# Patient Record
Sex: Male | Born: 1945 | Race: White | Hispanic: No | Marital: Married | Smoking: Former smoker
Health system: Southern US, Community
[De-identification: ages and names within clinical notes are randomized; demographics above are authoritative.]

## PROBLEM LIST (undated history)

## (undated) DIAGNOSIS — F419 Anxiety disorder, unspecified: Secondary | ICD-10-CM

## (undated) DIAGNOSIS — F1020 Alcohol dependence, uncomplicated: Secondary | ICD-10-CM

## (undated) DIAGNOSIS — E785 Hyperlipidemia, unspecified: Secondary | ICD-10-CM

## (undated) DIAGNOSIS — F319 Bipolar disorder, unspecified: Secondary | ICD-10-CM

## (undated) DIAGNOSIS — C61 Malignant neoplasm of prostate: Secondary | ICD-10-CM

## (undated) DIAGNOSIS — Z86718 Personal history of other venous thrombosis and embolism: Secondary | ICD-10-CM

## (undated) DIAGNOSIS — M1990 Unspecified osteoarthritis, unspecified site: Secondary | ICD-10-CM

## (undated) DIAGNOSIS — R0600 Dyspnea, unspecified: Secondary | ICD-10-CM

## (undated) DIAGNOSIS — I82409 Acute embolism and thrombosis of unspecified deep veins of unspecified lower extremity: Secondary | ICD-10-CM

## (undated) DIAGNOSIS — K759 Inflammatory liver disease, unspecified: Secondary | ICD-10-CM

## (undated) DIAGNOSIS — N529 Male erectile dysfunction, unspecified: Secondary | ICD-10-CM

## (undated) DIAGNOSIS — I10 Essential (primary) hypertension: Secondary | ICD-10-CM

## (undated) DIAGNOSIS — J449 Chronic obstructive pulmonary disease, unspecified: Secondary | ICD-10-CM

## (undated) DIAGNOSIS — I951 Orthostatic hypotension: Secondary | ICD-10-CM

## (undated) DIAGNOSIS — I2699 Other pulmonary embolism without acute cor pulmonale: Secondary | ICD-10-CM

## (undated) DIAGNOSIS — I639 Cerebral infarction, unspecified: Secondary | ICD-10-CM

## (undated) DIAGNOSIS — D6851 Activated protein C resistance: Secondary | ICD-10-CM

## (undated) DIAGNOSIS — D689 Coagulation defect, unspecified: Secondary | ICD-10-CM

## (undated) HISTORY — DX: Unspecified osteoarthritis, unspecified site: M19.90

## (undated) HISTORY — DX: Chronic obstructive pulmonary disease, unspecified: J44.9

## (undated) HISTORY — PX: COLONOSCOPY WITH PROPOFOL: SHX5780

## (undated) HISTORY — DX: Alcohol dependence, uncomplicated: F10.20

## (undated) HISTORY — PX: OTHER SURGICAL HISTORY: SHX169

## (undated) HISTORY — DX: Hyperlipidemia, unspecified: E78.5

## (undated) HISTORY — DX: Personal history of other venous thrombosis and embolism: Z86.718

## (undated) HISTORY — PX: TOTAL HIP ARTHROPLASTY: SHX124

## (undated) HISTORY — DX: Anxiety disorder, unspecified: F41.9

## (undated) HISTORY — DX: Essential (primary) hypertension: I10

## (undated) HISTORY — PX: CATARACT EXTRACTION: SUR2

## (undated) HISTORY — DX: Coagulation defect, unspecified: D68.9

## (undated) HISTORY — DX: Orthostatic hypotension: I95.1

## (undated) HISTORY — PX: TONSILLECTOMY AND ADENOIDECTOMY: SUR1326

## (undated) HISTORY — DX: Malignant neoplasm of prostate: C61

---

## 2001-11-25 ENCOUNTER — Encounter: Payer: Self-pay | Admitting: Orthopedic Surgery

## 2001-11-28 ENCOUNTER — Inpatient Hospital Stay (HOSPITAL_COMMUNITY): Admission: RE | Admit: 2001-11-28 | Discharge: 2001-12-04 | Payer: Self-pay | Admitting: Orthopedic Surgery

## 2001-11-28 ENCOUNTER — Encounter: Payer: Self-pay | Admitting: Orthopedic Surgery

## 2003-10-04 ENCOUNTER — Ambulatory Visit (HOSPITAL_COMMUNITY): Admission: RE | Admit: 2003-10-04 | Discharge: 2003-10-04 | Payer: Self-pay | Admitting: Gastroenterology

## 2003-12-14 ENCOUNTER — Other Ambulatory Visit (HOSPITAL_COMMUNITY): Admission: RE | Admit: 2003-12-14 | Discharge: 2003-12-24 | Payer: Self-pay | Admitting: Psychiatry

## 2004-11-30 DIAGNOSIS — I82409 Acute embolism and thrombosis of unspecified deep veins of unspecified lower extremity: Secondary | ICD-10-CM

## 2004-11-30 HISTORY — DX: Acute embolism and thrombosis of unspecified deep veins of unspecified lower extremity: I82.409

## 2005-03-12 ENCOUNTER — Encounter: Admission: RE | Admit: 2005-03-12 | Discharge: 2005-03-12 | Payer: Self-pay | Admitting: Chiropractic Medicine

## 2006-09-29 ENCOUNTER — Ambulatory Visit: Payer: Self-pay | Admitting: Cardiology

## 2006-10-13 ENCOUNTER — Ambulatory Visit: Payer: Self-pay

## 2007-01-25 ENCOUNTER — Ambulatory Visit: Payer: Self-pay | Admitting: Vascular Surgery

## 2007-06-20 ENCOUNTER — Emergency Department (HOSPITAL_COMMUNITY): Admission: EM | Admit: 2007-06-20 | Discharge: 2007-06-20 | Payer: Self-pay | Admitting: Emergency Medicine

## 2007-06-21 ENCOUNTER — Ambulatory Visit (HOSPITAL_COMMUNITY): Admission: RE | Admit: 2007-06-21 | Discharge: 2007-06-21 | Payer: Self-pay | Admitting: Family Medicine

## 2007-06-21 ENCOUNTER — Ambulatory Visit: Payer: Self-pay | Admitting: Vascular Surgery

## 2007-08-05 ENCOUNTER — Ambulatory Visit: Payer: Self-pay | Admitting: Psychology

## 2007-08-22 ENCOUNTER — Ambulatory Visit: Payer: Self-pay | Admitting: Psychology

## 2007-09-06 ENCOUNTER — Ambulatory Visit: Payer: Self-pay | Admitting: Vascular Surgery

## 2007-09-16 ENCOUNTER — Ambulatory Visit: Payer: Self-pay | Admitting: Psychology

## 2007-10-05 ENCOUNTER — Ambulatory Visit: Payer: Self-pay | Admitting: Psychology

## 2007-12-17 ENCOUNTER — Emergency Department (HOSPITAL_COMMUNITY): Admission: EM | Admit: 2007-12-17 | Discharge: 2007-12-17 | Payer: Self-pay | Admitting: Emergency Medicine

## 2007-12-22 ENCOUNTER — Observation Stay (HOSPITAL_COMMUNITY): Admission: AD | Admit: 2007-12-22 | Discharge: 2007-12-23 | Payer: Self-pay | Admitting: Internal Medicine

## 2007-12-22 ENCOUNTER — Ambulatory Visit: Payer: Self-pay | Admitting: Cardiology

## 2007-12-23 ENCOUNTER — Ambulatory Visit: Payer: Self-pay | Admitting: Vascular Surgery

## 2007-12-23 ENCOUNTER — Encounter (INDEPENDENT_AMBULATORY_CARE_PROVIDER_SITE_OTHER): Payer: Self-pay | Admitting: Internal Medicine

## 2008-10-17 ENCOUNTER — Encounter: Admission: RE | Admit: 2008-10-17 | Discharge: 2008-10-17 | Payer: Self-pay | Admitting: Interventional Radiology

## 2008-12-12 ENCOUNTER — Encounter: Admission: RE | Admit: 2008-12-12 | Discharge: 2008-12-12 | Payer: Self-pay | Admitting: Interventional Radiology

## 2008-12-18 ENCOUNTER — Encounter: Admission: RE | Admit: 2008-12-18 | Discharge: 2008-12-18 | Payer: Self-pay | Admitting: Interventional Radiology

## 2008-12-20 ENCOUNTER — Encounter: Admission: RE | Admit: 2008-12-20 | Discharge: 2008-12-20 | Payer: Self-pay | Admitting: Interventional Radiology

## 2009-01-15 ENCOUNTER — Encounter: Admission: RE | Admit: 2009-01-15 | Discharge: 2009-01-15 | Payer: Self-pay | Admitting: Interventional Radiology

## 2009-03-21 ENCOUNTER — Emergency Department (HOSPITAL_COMMUNITY): Admission: EM | Admit: 2009-03-21 | Discharge: 2009-03-22 | Payer: Self-pay | Admitting: Emergency Medicine

## 2009-03-22 ENCOUNTER — Inpatient Hospital Stay (HOSPITAL_COMMUNITY): Admission: RE | Admit: 2009-03-22 | Discharge: 2009-03-27 | Payer: Self-pay | Admitting: Psychiatry

## 2009-03-22 ENCOUNTER — Ambulatory Visit: Payer: Self-pay | Admitting: Psychiatry

## 2009-03-28 ENCOUNTER — Ambulatory Visit: Payer: Self-pay | Admitting: Psychiatry

## 2009-04-01 ENCOUNTER — Other Ambulatory Visit (HOSPITAL_COMMUNITY): Admission: RE | Admit: 2009-04-01 | Discharge: 2009-06-20 | Payer: Self-pay | Admitting: Psychiatry

## 2009-06-25 ENCOUNTER — Encounter: Admission: RE | Admit: 2009-06-25 | Discharge: 2009-06-25 | Payer: Self-pay | Admitting: Interventional Radiology

## 2010-06-18 ENCOUNTER — Encounter: Payer: Self-pay | Admitting: Internal Medicine

## 2010-06-18 ENCOUNTER — Ambulatory Visit (HOSPITAL_COMMUNITY): Admission: RE | Admit: 2010-06-18 | Discharge: 2010-06-18 | Payer: Self-pay | Admitting: Urology

## 2010-06-19 ENCOUNTER — Encounter: Payer: Self-pay | Admitting: Internal Medicine

## 2010-06-19 ENCOUNTER — Observation Stay (HOSPITAL_COMMUNITY): Admission: EM | Admit: 2010-06-19 | Discharge: 2010-06-20 | Payer: Self-pay | Admitting: Emergency Medicine

## 2010-06-25 ENCOUNTER — Ambulatory Visit: Admission: RE | Admit: 2010-06-25 | Discharge: 2010-08-29 | Payer: Self-pay | Admitting: Radiation Oncology

## 2010-07-02 ENCOUNTER — Inpatient Hospital Stay (HOSPITAL_COMMUNITY): Admission: AD | Admit: 2010-07-02 | Discharge: 2010-07-03 | Payer: Self-pay | Admitting: Internal Medicine

## 2010-08-05 ENCOUNTER — Encounter: Payer: Self-pay | Admitting: Internal Medicine

## 2010-09-02 ENCOUNTER — Ambulatory Visit: Payer: Self-pay | Admitting: Internal Medicine

## 2010-09-02 DIAGNOSIS — E785 Hyperlipidemia, unspecified: Secondary | ICD-10-CM | POA: Insufficient documentation

## 2010-09-02 DIAGNOSIS — C61 Malignant neoplasm of prostate: Secondary | ICD-10-CM | POA: Insufficient documentation

## 2010-09-02 DIAGNOSIS — E119 Type 2 diabetes mellitus without complications: Secondary | ICD-10-CM | POA: Insufficient documentation

## 2010-09-02 DIAGNOSIS — Z86718 Personal history of other venous thrombosis and embolism: Secondary | ICD-10-CM | POA: Insufficient documentation

## 2010-09-02 DIAGNOSIS — Z87891 Personal history of nicotine dependence: Secondary | ICD-10-CM | POA: Insufficient documentation

## 2010-09-03 ENCOUNTER — Ambulatory Visit: Payer: Self-pay | Admitting: Cardiovascular Disease

## 2010-09-03 LAB — CONVERTED CEMR LAB
Chloride: 102 meq/L (ref 96–112)
Creatinine, Ser: 1 mg/dL (ref 0.4–1.5)
GFR calc non Af Amer: 84.86 mL/min (ref >60)
Glucose, Bld: 101 mg/dL — ABNORMAL HIGH (ref 70–99)
Sodium: 136 meq/L (ref 135–145)

## 2010-09-09 ENCOUNTER — Telehealth (INDEPENDENT_AMBULATORY_CARE_PROVIDER_SITE_OTHER): Payer: Self-pay | Admitting: *Deleted

## 2010-09-10 ENCOUNTER — Telehealth: Payer: Self-pay | Admitting: Internal Medicine

## 2010-10-07 ENCOUNTER — Ambulatory Visit: Payer: Self-pay | Admitting: Internal Medicine

## 2010-10-21 ENCOUNTER — Ambulatory Visit
Admission: RE | Admit: 2010-10-21 | Discharge: 2010-12-30 | Payer: Self-pay | Source: Home / Self Care | Attending: Radiation Oncology | Admitting: Radiation Oncology

## 2010-11-30 DIAGNOSIS — C61 Malignant neoplasm of prostate: Secondary | ICD-10-CM

## 2010-11-30 HISTORY — DX: Malignant neoplasm of prostate: C61

## 2010-12-31 ENCOUNTER — Ambulatory Visit: Payer: 59 | Attending: Radiation Oncology | Admitting: Radiation Oncology

## 2010-12-31 DIAGNOSIS — C61 Malignant neoplasm of prostate: Secondary | ICD-10-CM | POA: Insufficient documentation

## 2010-12-31 DIAGNOSIS — Z510 Encounter for antineoplastic radiation therapy: Secondary | ICD-10-CM | POA: Insufficient documentation

## 2011-01-01 NOTE — Letter (Signed)
[facility]: [facility]   Imported By: Sherian Rein 09/05/2010 12:13:39  _____________________________________________________________________  External Attachment:    Type:   Image     Comment:   External Document

## 2011-01-01 NOTE — Assessment & Plan Note (Signed)
Summary: 1 month return/mhh   Primary Provider/Referring Provider:  [name]  CC:  1 month follow up visit-Denies any SOB/wheezing; cough-productive at times(clear in color)..  History of Present Illness: History of Present Illness: September 02, 2010- 65 yoM who comes with his wife on kind referral by [name] after pulmonary embolism. He was dx'd with prostate cancer in July. CT for staging discovered right iliac DVT and contrast chest CT showed pulmonary emboli. He is now on coumadin, managed by Dr [name]. He reports INR today was 4.0. There has been no bleeding. He had had an episode of calf swelling and an episode of self-limited chest pain within the last 2 months. [name] had given injections to shrink the prostate, Soon after he had to be hosp overnight for acute bowel obstruction. He has not had surgery for these problems. The chest CT, done in July, also showed incidental right 8th rib fracture, coronary artery calcification, and no chest mets. Doppler leg vein exam was done 2 weeks ago at [facility] when leg was swollen, and was positive for right iliac clot. He has known pelvic mets. At issue is whether coumadin can be stopped long enough for placement of radioactive seeds. He admits some early morning cough, no wheeze, and no hx of COPD, Asthma or lung disease. Notes some dyspnea with exertion walking, but not much. His wife repeatedly emphasized that he was not an active man. He gives hx DM II, TIA, bilateral hip replacement, most recently in 2006. 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  October 07, 2010- Hx PE/ DVT, Prostate cancer At issue is whether coumadin can be stopped long enough for placement of radioactive seeds. Nurse-CC: 1 month follow up visit-Denies any SOB/wheezing; cough-productive at times(clear in color). CT scan on 09/03/10 was clear of  pulmonary clot. Dr [name] has managed his coumadin, currently on 7.5 mg daily.Dppler at  [facility] had shown right iliac clot in September as described on last visit.  Has not had abnormal bleeding, chest pain or sudden events. Had flu shot. He is still on medcation to shrink his prostate.      Preventive Screening-Counseling & Management  Alcohol-Tobacco     Smoking Status: current     Packs/Day: 1.0     Year Started: age 65     Year Quit: 2011     Tobacco Counseling: not to resume use of tobacco products  Current Medications (verified): 1)  Pristiq 50 Mg Xr24h-Tab (Desvenlafaxine Succinate) .... Take 1 By Mouth Once Daily 2)  Seroquel 50 Mg Tabs (Quetiapine Fumarate) .... Take 1 By Mouth Once Daily 3)  Coumadin 10 Mg Tabs (Warfarin Sodium) .... Take As Directed 4)  Metformin Hcl 500 Mg Tabs (Metformin Hcl) .... Take 1 By Mouth Once Daily 5)  Proscar 5 Mg Tabs (Finasteride) .... Take 1 By Mouth Once Daily  Allergies (verified): 1)  ! Sulfa  Past History:  Past Medical History: Last updated: 09/02/2010 Diabetes, Type 2 Hyperlipidemia Venous thromboembolism 2011- DVT right iliac v,. 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         Rarely any sort of leg cramp except rarely lying in bed. No leg swelling.   Vital Signs:  Patient profile:   65 year old male Height:      72 inches Weight:      220.25 pounds BMI:     29.98 O2 Sat:      95 % on Room air Pulse rate:   94 / minute BP sitting:   104 / 62  (left arm) Cuff size:   regular  Vitals Entered By: Reynaldo Minium CMA (October 07, 2010 1:53 PM)  O2 Flow:  Room air CC: 1 month follow up visit-Denies any SOB/wheezing; cough-productive at times(clear in color).   Physical Exam  Additional Exam:  General: A/Ox3; pleasant and cooperative, NAD, overweight SKIN: no rash, lesions NODES: no lymphadenopathy HEENT: [place]/AT, EOM- WNL, Conjuctivae- clear, PERRLA, TM-WNL, Nose- clear, Throat- pharynx reddened, Mallampati  II-III, not hoarse NECK: Supple w/ fair ROM, JVD- none, normal carotid impulses w/o bruits Thyroid- normal to palpation, no stridor CHEST: Clear to P&A, no rales, rub, cough or wheeze HEART: RRR, no m/g/r heard ABDOMEN- abdominal obesity ZOX:WRUE, nl pulses, no edema, cyanosis or clubbing. Negative Homan's confirmed again today. NEURO: Grossly intact to observation      CT of Chest  Procedure date:  09/03/2010  Findings:      CT ANGIO CHEST W/CM &/OR WO/CM - [id number]   Clinical Data:  Follow up pulmonary embolus   CT ANGIOGRAPHY CHEST WITH CONTRAST   Technique:  Multidetector CT imaging of the chest was performed using the standard protocol during bolus administration of intravenous contrast.  Multiplanar CT image reconstructions including MIPs were obtained to evaluate the vascular anatomy.   Contrast:  80 ml of omni 300   Comparison:  None   Findings:  No enlarged axillary or supraclavicular lymph nodes.   No enlarged mediastinal or hilar lymph nodes noted.   No pericardial or pleural effusions identified.   No abnormal filling defects are identified within the  main pulmonary artery or its branches to suggest acute pulmonary embolus.   Trachea is midline and appears patent.   No airspace consolidation identified.  Dependent type changes are noted within the posterior lung bases bilaterally.   No suspicious pulmonary parenchymal nodules or masses noted.   Review of the visualized osseous structures is significant for multilevel spondylosis.  No change in the right posterior lateral rib fracture, image number 60.   Review of the MIP images confirms the above findings.   IMPRESSION:   1.  No evidence for acute pulmonary embolus.   Read By:  Rosealee Albee,  M.D.     Released By:  Rosealee Albee,  M.D.   Impression & Recommendations:  Problem # 1:  PERSONAL HISTORY, VENOUS THROMBOSIS AND EMBOLISM (ICD-V12.51)  No recurrence on coumadin and clot burden cleared  from lung per CT. I discussed options with Dr Denny Levy from Interventional Radiology. Options of admission for heparin to replace coumadin so heparin can be stopped for seed palcement, then resumption of coumadin. Considered temporary placement of IVC filter, but told fair probability that might displace or not be removable when no longer needed. I will direct this note to [name] then call him to discuss.   His updated medication list for this problem includes:    Coumadin 10 Mg Tabs (Warfarin sodium) .Marland Kitchen... Take as directed  Problem # 2:  TOBACCO ABUSE, HX OF (ICD-V15.82) Recommend pulmonary function test when able.  Other Orders: Est. Patient Level III (14782)  Patient Instructions: 1)  Please schedule a follow-up appointment as needed. 2)  I will contact [name] and Interventional Radiology to discuss plans. [name] can follow through, or involve me as needed.      CT of Chest  Procedure date:  09/03/2010  Findings:      CT ANGIO CHEST W/CM &/OR WO/CM - [id number]   Clinical Data:  Follow up pulmonary embolus   CT ANGIOGRAPHY CHEST WITH CONTRAST    Technique:  Multidetector CT imaging of the chest was performed using the standard protocol during bolus administration of intravenous contrast.  Multiplanar CT image reconstructions including MIPs were obtained to evaluate the vascular anatomy.   Contrast:  80 ml of omni 300   Comparison:  None   Findings:  No enlarged axillary or supraclavicular lymph nodes.   No enlarged mediastinal or hilar lymph nodes noted.   No pericardial or pleural effusions identified.   No abnormal filling defects are identified within the main pulmonary artery or its branches to suggest acute pulmonary embolus.   Trachea is midline and appears patent.   No airspace consolidation identified.  Dependent type changes are noted within the posterior lung bases bilaterally.   No suspicious pulmonary parenchymal nodules or masses noted.   Review of the visualized osseous structures is significant for multilevel spondylosis.  No change in the right posterior lateral rib fracture, image number 60.   Review of the MIP images confirms the above findings.   IMPRESSION:   1.  No evidence for acute pulmonary embolus.   Read By:  Rosealee Albee,  M.D.     Released By:  Rosealee Albee,  M.D.

## 2011-01-01 NOTE — Progress Notes (Signed)
Summary: results  Phone Note Call from Patient Call back at [phone]   Caller: Patient Call For: young Summary of Call: pt have questions about ct scan results Initial call taken by: Rickard Patience,  September 10, 2010 9:59 AM  Follow-up for Phone Call        Pt states that he just wanted to verify the results he was given yesterday becuse he wife had somequestions and he wanted to make sure he remembered everything. I reviewed per append of CT results. Carron Curie CMA  September 10, 2010 10:35 AM

## 2011-01-01 NOTE — Progress Notes (Signed)
Summary: results of CT  [id number]  Phone Note Call from Patient   Caller: Patient Call For: young Summary of Call: pt requests CT results. [phone] Initial call taken by: Tivis Ringer, CNA,  September 09, 2010 12:30 PM  Follow-up for Phone Call        pt had CT done 09/03/2010. CY appended to the CT results.  LMOM for pt TCB to inform him of CY's response.  Aundra Millet Reynolds LPN  September 09, 2010 1:32 PM    pt called back.  informed him of CT results.  pt verbalized understanding and denied any questions. Marland KitchenArman Filter LPN  September 09, 2010 1:42 PM

## 2011-01-01 NOTE — Assessment & Plan Note (Signed)
Summary: pulmonary embolism/ mbw   Primary Provider/Referring Provider:  [name]  CC:  Pulmonary Consult-Dr. Boris Lown Embolism.Marland Kitchen  History of Present Illness: September 02, 2010- 65 yoM who comes with his wife on kind referral by [name] after pulmonary embolism. He was dx'd with prostate cancer in July. CT for staging discovered right iliac DVT and contrast chest CT showed pulmonary emboli. He is now on coumadin, managed by Dr [name]. He reports INR today was 4.0. There has been no bleeding. He had had an episode of calf swelling and an episode of self-limited chest pain within the last 2 months. [name] had given injections to shrink the prostate, Soon after he had to be hosp overnight for acute bowel obstruction. He has not had surgery for these problems. The chest CT, done in July, also showed incidental right 8th rib fracture, coronary artery calcification, and no chest mets. Doppler leg vein exam was done 2 weeks ago at [facility] when leg was swollen, and was positive for right iliac clot. He has known pelvic mets. At issue is whether coumadin can be stopped long enough for placement of radioactive seeds. He admits some early morning cough, no wheeze, and no hx of COPD, Asthma or lung disease. Notes some dyspnea with exertion walking, but not much. His wife repeatedly emphasized that he was not an active man. He gives hx DM II, TIA, bilateral hip replacement, most recently in 2006. 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   Preventive Screening-Counseling & Management  Alcohol-Tobacco     Smoking Status: quit     Packs/Day: 1.0     Year Started: age 40     Year Quit: 2011     Tobacco Counseling: not to resume use of tobacco products  Current Medications (verified): 1)  Pristiq 50 Mg Xr24h-Tab (Desvenlafaxine Succinate) .... Take 1 By Mouth Once Daily 2)  Seroquel 50 Mg Tabs (Quetiapine Fumarate) .... Take 1 By Mouth Once Daily 3)  Coumadin  10 Mg Tabs (Warfarin Sodium) .... Take As Directed 4)  Metformin Hcl 500 Mg Tabs (Metformin Hcl) .... Take 1 By Mouth Once Daily 5)  Proscar 5 Mg Tabs (Finasteride) .... Take 1 By Mouth Once Daily  Allergies (verified): 1)  ! 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  The patient denies shortness of breath with activity, shortness of breath at rest, productive cough, non-productive cough,  coughing up blood, chest pain, irregular heartbeats, acid heartburn, loss of appetite, weight change, abdominal pain, difficulty swallowing, sore throat, tooth/dental problems, headaches, nasal congestion/difficulty breathing through nose, sneezing, itching, ear ache, rash, change in color of mucus, and fever.    Vital Signs:  Patient profile:   65 year old male Height:      72 inches Weight:      225 pounds BMI:     30.63 O2 Sat:      97 % on Room air Pulse rate:   97 / minute BP sitting:   112 / 64  (left arm) Cuff size:   regular  Vitals Entered By: Reynaldo Minium CMA (September 02, 2010 2:03 PM)  O2 Flow:  Room  air  Physical Exam  Additional Exam:  General: A/Ox3; pleasant and cooperative, NAD, overweight SKIN: no rash, lesions NODES: no lymphadenopathy HEENT: [place]/AT, EOM- WNL, Conjuctivae- clear, PERRLA, TM-WNL, Nose- clear, Throat- pharynx reddened, Mallampati  II-III, not hoarse NECK: Supple w/ fair ROM, JVD- none, normal carotid impulses w/o bruits Thyroid- normal to palpation, no stridor CHEST: Clear to P&A, no rales, rub, cough or wheeze HEART: RRR, no m/g/r heard ABDOMEN: Soft and nl; nml bowel sounds; no organomegaly or masses noted, overweight RUE:AVWU, nl pulses, no edema, cyanosis or clubbing. Negative Homan's.  NEURO: Grossly intact to observation      Impression & Recommendations:  Problem # 1:  PERSONAL HISTORY, VENOUS THROMBOSIS AND EMBOLISM (ICD-V12.51)  We have discussed possibility of an IVC filter which would allow cessation of anticoagulation long enough for seed placement. His pelvic mets and hx of bilateral hip replacement suggest increased long term risk of recurrence, so unprotected time off coumadin seems ill-advised. We are going to restage the pulmonary clot burden with a repeat chest CT/ CM, now 3 months after the baseline, and I will discuss options with Dr Dayton Scrape and [name].  His updated medication list for this problem includes:    Coumadin 10 Mg Tabs (Warfarin sodium) .Marland Kitchen... Take as directed  Problem # 2:  TOBACCO ABUSE, HX OF (ICD-V15.82) He has quit smoking as of May, 2011. We will want to know what his pulmonary function status is and will be ordering PFT. I pointed out that CT in May had indicated coronary atherosclerosis. I explained this did not automatically mean he was at coronary risk, but asked that he discuss it with Dr Clarene Duke.  Problem # 3:  PROSTATE CANCER (ICD-185) CT indicated pelvic mets. He will be quided by [name] and Dr Dayton Scrape.  Medications Added to Medication List This Visit: 1)  Pristiq 50 Mg Xr24h-tab (Desvenlafaxine  succinate) .... Take 1 by mouth once daily 2)  Seroquel 50 Mg Tabs (Quetiapine fumarate) .... Take 1 by mouth once daily 3)  Coumadin 10 Mg Tabs (Warfarin sodium) .... Take as directed 4)  Metformin Hcl 500 Mg Tabs (Metformin hcl) .... Take 1 by mouth once daily 5)  Proscar 5 Mg Tabs (Finasteride) .... Take 1 by mouth once daily  Other Orders: Consultation Level IV (98119) Radiology Referral (Radiology) TLB-BMP (Basic Metabolic Panel-BMET) (80048-METABOL)  Patient Instructions: 1)  Please schedule a follow-up appointment in 1 month. 2)  See [facility] to set up CT  3)  Lab 4)  cc Dr Wyline Beady, [name]   Immunization History:  Influenza Immunization History:    Influenza:  historical (09/02/2010)

## 2011-02-13 LAB — PROTIME-INR
INR: 2.14 — ABNORMAL HIGH (ref 0.00–1.49)
INR: 2.51 — ABNORMAL HIGH (ref 0.00–1.49)

## 2011-02-13 LAB — COMPREHENSIVE METABOLIC PANEL
AST: 21 U/L (ref 0–37)
Albumin: 3.8 g/dL (ref 3.5–5.2)
Alkaline Phosphatase: 110 U/L (ref 39–117)
BUN: 16 mg/dL (ref 6–23)
CO2: 27 mEq/L (ref 19–32)
GFR calc Af Amer: >60 mL/min (ref >60)
GFR calc non Af Amer: >60 mL/min (ref >60)
Potassium: 5.5 mEq/L — ABNORMAL HIGH (ref 3.5–5.1)

## 2011-02-13 LAB — CBC
HCT: 41.5 % (ref 39.0–52.0)
HCT: 45.7 % (ref 39.0–52.0)
Hemoglobin: 15.7 g/dL (ref 13.0–17.0)
MCH: 32 pg (ref 26.0–34.0)
MCHC: 34.5 g/dL (ref 30.0–36.0)
MCV: 92.7 fL (ref 78.0–100.0)
MCV: 93 fL (ref 78.0–100.0)
Platelets: 134 10*3/uL — ABNORMAL LOW (ref 150–400)
Platelets: 160 10*3/uL (ref 150–400)
RDW: 13.3 % (ref 11.5–15.5)
RDW: 13.5 % (ref 11.5–15.5)

## 2011-02-13 LAB — BASIC METABOLIC PANEL
BUN: 9 mg/dL (ref 6–23)
CO2: 25 mEq/L (ref 19–32)
CO2: 25 mEq/L (ref 19–32)
Chloride: 104 mEq/L (ref 96–112)
Creatinine, Ser: 0.94 mg/dL (ref 0.4–1.5)
GFR calc Af Amer: >60 mL/min (ref >60)
GFR calc Af Amer: >60 mL/min (ref >60)
GFR calc non Af Amer: >60 mL/min (ref >60)
Glucose, Bld: 148 mg/dL — ABNORMAL HIGH (ref 70–99)
Glucose, Bld: 154 mg/dL — ABNORMAL HIGH (ref 70–99)

## 2011-02-13 LAB — APTT: aPTT: 47 seconds — ABNORMAL HIGH (ref 24–37)

## 2011-02-13 LAB — GLUCOSE, CAPILLARY: Glucose-Capillary: 115 mg/dL — ABNORMAL HIGH (ref 70–99)

## 2011-02-14 LAB — CBC
HCT: 44.1 % (ref 39.0–52.0)
HCT: 44.1 % (ref 39.0–52.0)
Hemoglobin: 14.9 g/dL (ref 13.0–17.0)
Hemoglobin: 15.2 g/dL (ref 13.0–17.0)
MCH: 32.3 pg (ref 26.0–34.0)
RBC: 4.69 MIL/uL (ref 4.22–5.81)
RBC: 4.72 MIL/uL (ref 4.22–5.81)
WBC: 5.5 10*3/uL (ref 4.0–10.5)

## 2011-02-14 LAB — DIFFERENTIAL
Basophils Absolute: 0 10*3/uL (ref 0.0–0.1)
Eosinophils Absolute: 0.6 10*3/uL (ref 0.0–0.7)
Lymphocytes Relative: 23 % (ref 12–46)
Lymphs Abs: 1.2 10*3/uL (ref 0.7–4.0)
Lymphs Abs: 1.4 10*3/uL (ref 0.7–4.0)
Monocytes Absolute: 0.6 10*3/uL (ref 0.1–1.0)
Monocytes Absolute: 0.7 10*3/uL (ref 0.1–1.0)
Monocytes Relative: 11 % (ref 3–12)
Monocytes Relative: 11 % (ref 3–12)
Neutro Abs: 3 10*3/uL (ref 1.7–7.7)
Neutrophils Relative %: 58 % (ref 43–77)

## 2011-02-14 LAB — APTT: aPTT: 35 seconds (ref 24–37)

## 2011-02-14 LAB — BASIC METABOLIC PANEL
GFR calc non Af Amer: >60 mL/min (ref >60)
Potassium: 4.4 mEq/L (ref 3.5–5.1)
Sodium: 140 mEq/L (ref 135–145)

## 2011-02-14 LAB — POCT I-STAT, CHEM 8
BUN: 10 mg/dL (ref 6–23)
Chloride: 103 mEq/L (ref 96–112)
Sodium: 138 mEq/L (ref 135–145)

## 2011-02-14 LAB — PROTIME-INR: INR: 0.96 (ref 0.00–1.49)

## 2011-02-14 LAB — GLUCOSE, CAPILLARY
Glucose-Capillary: 116 mg/dL — ABNORMAL HIGH (ref 70–99)
Glucose-Capillary: 120 mg/dL — ABNORMAL HIGH (ref 70–99)

## 2011-03-09 LAB — URINE DRUGS OF ABUSE SCREEN W ALC, ROUTINE (REF LAB)
Amphetamine Screen, Ur: NEGATIVE
Amphetamine Screen, Ur: NEGATIVE
Cocaine Metabolites: NEGATIVE
Cocaine Metabolites: NEGATIVE
Creatinine,U: 95.4 mg/dL
Opiate Screen, Urine: NEGATIVE
Opiate Screen, Urine: NEGATIVE
Propoxyphene: NEGATIVE
Propoxyphene: NEGATIVE

## 2011-03-10 ENCOUNTER — Ambulatory Visit: Payer: 59 | Attending: Radiation Oncology | Admitting: Radiation Oncology

## 2011-03-10 LAB — URINE DRUGS OF ABUSE SCREEN W ALC, ROUTINE (REF LAB)
Amphetamine Screen, Ur: NEGATIVE
Amphetamine Screen, Ur: NEGATIVE
Amphetamine Screen, Ur: NEGATIVE
Barbiturate Quant, Ur: NEGATIVE
Benzodiazepines.: NEGATIVE
Cocaine Metabolites: NEGATIVE
Creatinine,U: 116.5 mg/dL
Marijuana Metabolite: NEGATIVE
Marijuana Metabolite: NEGATIVE
Methadone: NEGATIVE
Methadone: NEGATIVE
Opiate Screen, Urine: NEGATIVE
Phencyclidine (PCP): NEGATIVE
Propoxyphene: NEGATIVE
Propoxyphene: NEGATIVE

## 2011-03-10 LAB — BENZODIAZEPINE, QUANTITATIVE, URINE: Flurazepam GC/MS Conf: NEGATIVE

## 2011-03-11 LAB — GLUCOSE, CAPILLARY
Glucose-Capillary: 110 mg/dL — ABNORMAL HIGH (ref 70–99)
Glucose-Capillary: 120 mg/dL — ABNORMAL HIGH (ref 70–99)
Glucose-Capillary: 121 mg/dL — ABNORMAL HIGH (ref 70–99)
Glucose-Capillary: 127 mg/dL — ABNORMAL HIGH (ref 70–99)
Glucose-Capillary: 136 mg/dL — ABNORMAL HIGH (ref 70–99)
Glucose-Capillary: 143 mg/dL — ABNORMAL HIGH (ref 70–99)
Glucose-Capillary: 148 mg/dL — ABNORMAL HIGH (ref 70–99)
Glucose-Capillary: 157 mg/dL — ABNORMAL HIGH (ref 70–99)
Glucose-Capillary: 157 mg/dL — ABNORMAL HIGH (ref 70–99)
Glucose-Capillary: 169 mg/dL — ABNORMAL HIGH (ref 70–99)
Glucose-Capillary: 186 mg/dL — ABNORMAL HIGH (ref 70–99)
Glucose-Capillary: 90 mg/dL (ref 70–99)

## 2011-03-11 LAB — COMPREHENSIVE METABOLIC PANEL
Alkaline Phosphatase: 129 U/L — ABNORMAL HIGH (ref 39–117)
BUN: 14 mg/dL (ref 6–23)
BUN: 12 mg/dL (ref 6–23)
CO2: 26 mEq/L (ref 19–32)
Calcium: 9.5 mg/dL (ref 8.4–10.5)
Calcium: 9.1 mg/dL (ref 8.4–10.5)
Creatinine, Ser: 0.99 mg/dL (ref 0.4–1.5)
GFR calc non Af Amer: >60 mL/min (ref >60)
Glucose, Bld: 196 mg/dL — ABNORMAL HIGH (ref 70–99)
Glucose, Bld: 113 mg/dL — ABNORMAL HIGH (ref 70–99)
Potassium: 3.9 mEq/L (ref 3.5–5.1)
Sodium: 135 mEq/L (ref 135–145)
Total Protein: 6.8 g/dL (ref 6.0–8.3)
Total Protein: 7.1 g/dL (ref 6.0–8.3)

## 2011-03-11 LAB — RAPID URINE DRUG SCREEN, HOSP PERFORMED
Amphetamines: NOT DETECTED
Barbiturates: NOT DETECTED
Cocaine: NOT DETECTED
Opiates: NOT DETECTED
Tetrahydrocannabinol: NOT DETECTED

## 2011-03-11 LAB — HEPATIC FUNCTION PANEL
Albumin: 3.6 g/dL (ref 3.5–5.2)
Alkaline Phosphatase: 127 U/L — ABNORMAL HIGH (ref 39–117)
Indirect Bilirubin: 0.9 mg/dL (ref 0.3–0.9)
Total Protein: 6.8 g/dL (ref 6.0–8.3)

## 2011-03-11 LAB — MAGNESIUM: Magnesium: 2.3 mg/dL (ref 1.5–2.5)

## 2011-04-14 NOTE — H&P (Signed)
NAMEDAN, SCEARCE NO.:  [id number]   MEDICAL RECORD NO.:  [id number]          PATIENT TYPE:  INP   LOCATION:  6731                         FACILITY:  [facility]   PHYSICIAN:  [name] Harvest, MD    DATE OF BIRTH:  07/10/1946   DATE OF ADMISSION:  12/22/2007  DATE OF DISCHARGE:                              HISTORY & PHYSICAL   PRIMARY CARE PHYSICIAN:  [name] Bee L. Little, MD, of [facility].   HISTORY OF PRESENT ILLNESS:  [name] Hall is a 65 year old, white  male, history of hypertension and hyperlipidemia, new onset type 2  diabetes, alcohol use, a prior history of tobacco use, a family history  of a CVA, who had presented as a direct admit from primary care  physician's office for a CVA workup.  Per patient and wife, the patient  went to the beach two-and-a-half weeks prior to admission and while  there experienced fatigue, slurred speech, and memory lapse for two  hours.  The patient denied any visual changes, no facial asymmetry, no  weakness, no other neurological deficits at the time.  Six days prior to  admission at night around midnight, the patient went to the bathroom and  had some alcohol that day and experienced some slurred speech, some  disorientation, and falls x2.  The patient denied any syncope, no visual  changes, no facial asymmetry, no other associated symptoms.  EMS was  called, patient was sent to the ED.  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  The patient's  symptoms resolved within three hours, and patient was discharged home  for followup with the primary care physician for outpatient workup for a  possible TIA.  The patient was seen in the primary care physician's  office on the day of admission, and PCP directly admitted patient for a  stroke workup.  The  patient is currently asymptomatic with no focal  neurological deficits.   ALLERGIES:  SULFA leads to itching, rash, and hives.   PAST MEDICAL HISTORY:  1. Hypertension.  2. Hyperlipidemia.  3. Anxiety.  4. Status post bilateral hip replacement in 2002 and 2006.  5. Status post tonsillectomy.  6. Type 2 diabetes newly diagnosed three weeks ago.  7. Osteoarthritis.  8. History of elevated PSA.  9. Varicose veins.  10.A history of superficial phlebitis.  11.Depression.  12.History of factor V Leiden mutation.   MEDICATIONS:  1. Lexapro 20 mg daily.  2. Alprazolam 0.5 mg b.i.d. p.r.n.  3. Ambien-CR 12.5 mg q.h.s. p.r.n.  4. Lipitor 20 mg daily.  5. Ramipril 10 mg p.o. daily.  6. Levitra 20 mg p.r.n.  7. Aspirin 325 mg daily.   SOCIAL HISTORY:  Patient lives in [place] with his wife, quit tobacco  use in 2007, prior to that had a five-pack-year history, positive  alcohol use, last drink was about six days ago, patient usually drinks  about 2 martinis a day, no IV  drug use, the patient has three sons, all  of whom are healthy.   FAMILY HISTORY:  1. Mother deceased, age 67, from heart failure, did have a history of      hyperlipidemia and a CVA in her 62s.  2. Father deceased at age 100 from an acute MI, also had a history of      hyperlipidemia.  3. One sister decreased from scleroderma complications.  4. Two sisters, who are alive, with obesity, type 2 diabetes, and      hyperlipidemia.   REVIEW OF SYSTEMS:  Positive for diarrhea, postnasal cough, rash,  elevated PSA, and occasional shortness of breath on exertion; otherwise  as per HPI.   PHYSICAL EXAMINATION:  VITAL SIGNS:  Temperature 98.7, blood pressure  117/86, respiratory rate 19, pulse of 72, saturation 98% on room air.  GENERAL:  The patient is in no apparent distress.  HEENT:  Normocephalic, atraumatic, pupils equal, round, and reactive to  light, extraocular movements intact.  Oropharynx is clear, dry, no   lesions, no exudates.  NECK:  Supple, no lymphadenopathy.  RESPIRATORY:  Lungs are clear to auscultation bilaterally, no wheezes,  no rhonchi.  CARDIOVASCULAR:  Regular rate and rhythm, no murmurs, rubs, or gallops.  ABDOMEN:  Soft, nontender, and nondistended, positive bowel sounds.  EXTREMITIES:  No clubbing, cyanosis, or edema.  NEURO:  The patient is alert and oriented x3, cranial nerves II-XII are  grossly intact, no focal deficits, sensation is intact, cerebellum is  intact, gait is intact, unable to obtain reflexes symmetrically, and  diffusely 5 out of 5 bilateral upper extremity strength, 5 out of 5  bilateral lower extremity strength.  Babinski is normal.   ASSESSMENT AND PLAN:  [name] Hall is a 65 year old gentleman with  multiple risk factors including hyperlipidemia, hypertension, new onset  type 2 diabetes, prior history of tobacco use, alcohol use, family  history of cerebrovascular accident, who presented from primary care  physician's office for workup of cerebrovascular accident/transient  ischemic attack.   PROBLEM LIST:  1. TIA:  Admit patient to a telemetry bed.  Will get a stroke workup,      will check a CMET, check PT-INR, check a CBC with diff, check a UA,      check hemoglobin A1c, cycle cardiac enzymes q.8h x3, check an      alcohol level, check a UDS, check a fasting lipid and fasting      homocystine level, get an EKG, get carotid Dopplers, check a two-D      echo, check an MRI/MRA of the brain, and check a chest x-ray.  We      will get a swallow evaluation, PT-OT, aspirin 325 mg daily, will      place patient on normal saline 75 ml per hour and monitor.  If the      MRI is positive for an acute infarct, we will get Neurology consult      for further evaluation and management.  2. Dyslipidemia:  Check a fasting lipid panel, will continue home dose      Lipitor.  3. Hypertension:  Continue home dose Ramipril 10 mg daily.  4. Depression:  Continue  home dose Lexapro.  5. Anxiety:  Alprazolam 0.5 mg b.i.d. as needed.  6. Type 2 diabetes:  Check a hemoglobin A1c, place the patient on a      diabetic diet and sliding scale insulin.  7. Osteoarthritis.  8. Prophylaxis:  Protonix for GI prophylaxis, Lovenox  for DVT      prophylaxis.   It has been a pleasure taking care of [name] Hall.      [name] Harvest, MD  Electronically Signed     DT/MEDQ  D:  12/22/2007  T:  12/22/2007  Job:  [id number]   cc:   [name] Hall, M.D.

## 2011-04-14 NOTE — H&P (Signed)
NAMEGAETAN, SPIEKER NO.:  [id number]   MEDICAL RECORD NO.:  [id number]          PATIENT TYPE:  IPS   LOCATION:  0508                          FACILITY:  [facility]   PHYSICIAN:  Anselm Jungling, MD  DATE OF BIRTH:  [birth year]   DATE OF ADMISSION:  03/22/2009  DATE OF DISCHARGE:                       PSYCHIATRIC ADMISSION ASSESSMENT   IDENTIFICATION:  This is a 65 year old male voluntarily admitted on  03/22/2009.  The patient prefers to be called [name] Hall.   HISTORY OF PRESENT ILLNESS:  The patient reports a history of  depression, ongoing stress and has been abusing alcohol, drinking  approximately seven beers a day.  Has been drinking during the day and  trying to hide it from his wife.  He feels he is neglecting his  employment and is here to get help.  He apparently was sober from  alcohol until February 2010.  He denies any depression or substance use.   PAST PSYCHIATRIC HISTORY:  This is first admission to the [facility] was in the IOP program in 2005.  Sees Dr. Andee Poles  for outpatient mental health services.   SOCIAL HISTORY:  This is married male.  He is self-employed.   FAMILY HISTORY:  None.   ALCOHOL AND DRUG HISTORY:  As above.  Denies any seizures.  Denies any  other recreational drug use.   PRIMARY CARE PHYSICIAN:  Dr. [name].   PAST MEDICAL HISTORY:  1. Diabetes.  2. Elevated cholesterol.   MEDICATIONS:  1. Listed as Ambien 10 mg at bedtime.  2. Cymbalta 60 mg daily.  3. Aspirin 325 daily.  4. Seroquel 50 mg t.i.d.  5. Lipitor 80 mg daily.  6. Metformin 500 mg daily.   DRUG ALLERGIES:  SULFA.   PHYSICAL EXAMINATION:  GENERAL:  This is a well-nourished, well-  developed male, fully assessed at [facility].  Physical exam was reviewed.  Of note is that the patient was anxious but  the remainder of exam was within normal limits.  He was well hydrated  and well developed.  As stated, he did  receive Ativan.   MENTAL STATUS EXAM:  He is fully alert and cooperative, casually dressed  and pleasant.  Speech is clear.  The patient's mood is neutral.  Again,  he is pleasant and agreeable to suggestions.  He is coherent and goal  directed.  Cognitive function intact.  His memory appears intact.  Judgment and insight are good.   IMPRESSION:  AXIS I.  1.  Alcohol dependence.  1. Depressive disorder, not otherwise specified.  AXIS II:  Deferred.  AXIS III.  1.  Hypertension.  1. Diabetes.  AXIS IV.  Problems with occupation and other psychosocial problems.  AXIS V.  Current is 45-50.   PLAN:  To continue with the patient's medications.  We will have the  Librium protocol for withdrawal symptoms.  Consider a family session  with his wife.  Will check his blood sugars daily, and patient is to  follow up with Dr. Nolen Mu for outpatient mental health services.  Also  the patient does state  that he had recently had made an appointment with  a psychologist which he intends to follow up with as well.   His tentative length of stay is three to five days.      Landry Corporal, N.P.      Anselm Jungling, MD  Electronically Signed    JO/MEDQ  D:  03/22/2009  T:  03/22/2009  Job:  [id number]

## 2011-04-14 NOTE — Discharge Summary (Signed)
NAMEDANIELE, YANKOWSKI NO.:  [id number]   MEDICAL RECORD NO.:  [id number]          PATIENT TYPE:  OBV   LOCATION:  6731                         FACILITY:  [facility]   PHYSICIAN:  Ramiro Harvest, MD    DATE OF BIRTH:  May 05, 1946   DATE OF ADMISSION:  12/22/2007  DATE OF DISCHARGE:  12/23/2007                               DISCHARGE SUMMARY   PRIMARY CARE PHYSICIAN:  Dr. Clarene Duke of [facility] physician.   DISCHARGE DIAGNOSIS:  1. Probable transient ischemic attack..  2. Dyslipidemia.  3. Hypertension.  4. Depression/anxiety.  5. Type 2 diabetes, diet-controlled.  6. Osteoarthritis.  7. History of superficial phlebitis  8. Status post bilateral hip replacements.  9. History of elevated PSA.  10.Varicose veins.   DISCHARGE MEDICATIONS:  1. Ambien CR 12.5 mg p.o. q.h.s. p.r.n.  2. Lexapro 20 mg p.o. daily.  3. Aspirin 325 mg p.o. daily.  4. Xanax 0.5 mg daily p.r.n.  5. Lipitor 20 mg p.o. daily.  6. Ramipril 10 mg p.o. daily.  7. Fish oil 1000 mg p.o. daily.   DISPOSITION AND FOLLOWUP:  The patient will be discharged home to follow  up with her primary care physician in the next 1-2 weeks for follow-up  of this hospitalization. The patient will need to continue her risk  factor modification for his diabetes in terms of diet control, his  hypertension and hyperlipidemia.   PROCEDURES PERFORMED:  1. A chest x-ray was performed on December 22, 2007 with no acute      cardiopulmonary abnormality.  2. A MRI was performed on December 22, 2007 with a preliminary results      reading no acute intracranial abnormality, chronic right frontal,      cortical and  white matter infarct, otherwise generalized volume      loss.  3. MRA of the neck bilateral ICA bulb, plaque without stenosis.      artifact versus high-grade stenosis on the right vertebral origin      with preserved flow distally. Carotid Dopplers were done on the      December 23, 2007 with preliminary results  showing no ICA stenosis      bilaterally. A 2-D echo was also done on December 23, 2007 which      showed normal systolic function, EF 60-65%, no diagnostic evidence      of left ventricular regional wall motion abnormalities, left      ventricular wall thickness was upper limits of normal. Features      were consistent with moderate diastolic dysfunction.  There was      trivial tricuspid valvular regurgitation.   CONSULTATIONS:  None.   ADMISSION HISTORY AND PHYSICAL:  Mr. Jaedin Regina is a 65 year old  white male with a history of hypertension, hyperlipidemia, new onset  type 2 diabetes, alcohol use, prior tobacco use, family history of a CVA  who had presented as a direct admit from his primary care physician's  office for a CVA workup. Per patient and wife, the patient went to the  beach two and half weeks prior to admission and while then experienced  fatigued, slurred speech and memory lapse for approximately 2 hours. The  patient denied any visual changes then, no facial asymmetry, no  weakness, no other neurological deficits at the time. Six days prior to  admission at night around midnight, the patient had also been drinking  at the time,the patient went to the bathroom and had experienced some  slurred speech and some disorientation and fell x2.  The patient denied  any syncope, no visual changes, no facial symmetry, no other associated  symptoms. EMS was called.  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  The patient's symptoms resolved within 3 hours and the patient was  discharged home for follow-up with primary care physician for outpatient  workup of possible TIA.  The patient was seen in the primary care  physician's office on the day of admission and  the patient was directly  admitted for stroke workup.  The patient on admission was asymptomatic  with no focal neurological deficits.   PHYSICAL EXAM:  Temperature 98.7, blood pressure 117/86, respiratory  rate 19, pulse of 72, satting 98% on room air.  GENERAL: The patient is in no apparent distress.  HEENT: Normocephalic, atraumatic.  Pupils equal, round and reactive to  light.  Extraocular movements intact.  Oropharynx was clear, dry, no  lesions.  No exudate.  NECK:  Supple.  No lymphadenopathy.  RESPIRATORY:  Lungs clear to auscultation bilaterally.  No wheezes, no  rhonchi.  CARDIOVASCULAR:  Regular rate and rhythm.  No murmurs, rubs or gallops.  ABDOMEN:  Soft, nontender, nondistended, positive bowel sounds.  EXTREMITIES:  No clubbing, cyanosis or edema.  NEUROLOGIC:  The patient was alert and oriented x3.  Cranial nerves II-  XII grossly intact.  No focal deficits.  Sensation intact.  Cerebellum  intact.  Gait was intact.  Unable to obtain reflexes symmetrically and  diffusely 5/5 bilateral upper extremity strength, 5/5 bilateral lower  extremity strength.  Babinski was normal.  Romberg was also normal.   HOSPITAL COURSE:  1. Probable/questionable TIA.  The patient was admitted to the tele      bed and a stroke workup was done. A slew of labs were obtained      including CMET, coags, CBC, UA, hemoglobin A1c. Cardiac enzymes      were cycled x3 which came back negative.  Alcohol level was less      than 5.  A fasting lipid panel was also obtained.  EKG was normal      with no change. Carotid Dopplers were also obtained with      preliminary results as stated above. A  2-D echo was obtained with      results as stated above.  MRI, MRA of the head and neck were also      obtained with preliminary results as stated above which showed no      acute infarct. Chest x-ray was also negative.  PT, OT was ordered.      The patient was continued on home dose of aspirin 325 mg daily.  The      patient was monitored throughout the hospitalization. The patient      was asymptomatic throughout the hospitalization and the patient was      in stable and  improved condition.  The patient was maintained on      his home dose of Lipitor during this hospitalization. The patient      remained stable.  The patient will be discharged home in stable and      improved condition to follow up with PCP as an outpatient.  The      patient will need to continue risk factor modification with his      diabetes, hypertension and hyperlipidemia.  The patient will be      discharged in stable and improved condition, placed on a full dose      aspirin 325 mg daily.  2. Dyslipidemia.  A fasting lipid panel was checked.  The patient had      a total cholesterol of 166, triglycerides of 70, HDL of 61, LDL of      91, VLDL of 14.  The patient was maintained on his home dose of      Lipitor throughout the hospitalization. The patient will be      continued on this as an outpatient.  3. Hypertension controlled during the hospitalization. The patient was      maintained on his home dose of Ramipril 10 mg daily.  4. Depression. Stable throughout the hospitalization. The patient was      placed on his home dose of Lexapro.  5. Anxiety. Stable throughout the hospitalization. The patient was      maintained on his home dose of alprazolam.  6. Type 2 diabetes.  The patient had a hemoglobin A1c that was checked      which came out at 6.1.  The patient was just placed on a sliding      scale insulin and a diabetic diet.  The patient is to be followed      up as an outpatient per PCP. The rest of the patient's chronic      medical issues were stable throughout the hospitalization. The      patient will be discharged in stable condition to followup with PCP      in the next 1-2 weeks.      Ramiro Harvest, MD  Electronically Signed     DT/MEDQ  D:  12/23/2007  T:  12/23/2007  Job:  [id number]   cc:    Caryn Bee L. Little, M.D.

## 2011-04-14 NOTE — Discharge Summary (Signed)
NAMEEDWARDS, MCKELVIE NO.:  [id number]   MEDICAL RECORD NO.:  [id number]          PATIENT TYPE:  IPS   LOCATION:  0303                          [facility]ACILITY[facility]  [facility]   PHYSICIAN:  Geoffery Lyons, M.D.      DATE OF BIRTH:  [birth year]   DATE OF ADMISSION:  03/22/2009  DATE OF DISCHARGE:  03/27/2009                               DISCHARGE SUMMARY   CHIEF COMPLAINT AND PRESENT ILLNESS:  This was the first admission to  [facility] for this 65 year old male voluntarily  admitted.  Reports history of depression, ongoing stress, has been  abusing alcohol, drinking 7 beers a day.  He had been drinking during  the day trying to hide it from his wife.  He feels he is neglecting his  employment, wants help.  He was sober from alcohol until February 2010.   PAST PSYCHIATRIC HISTORY:  First admission to [facility]  He was  in the IOP program in 2005 being seen by Dr. Andee Poles and  Valinda Hoar, NP.   ALCOHOL AND DRUG HISTORY:  As already stated, he has been using alcohol,  relapsed February 2010, 7+ beers per day starting early on.  No other  substances.   MEDICAL HISTORY:  Diabetes mellitus, hypercholesterolemia.   MEDICATIONS:  1. Ambien 10 mg at night.  2. Cymbalta 60 mg per day.  3. Aspirin 325 mg per day.  4. Seroquel 50 mg 3 times a day.  5. Lipitor 80 mg per day.  6. Metformin 500 mg per day.   PHYSICAL EXAMINATION:  Failed to show any acute findings.   LABORATORY WORKUP:  Sodium 135, potassium 3.4, glucose 113.  Liver  enzymes:  SGOT 15, SGPT 17, total bilirubin 1.1.  Blood glucose  fluctuated from the low of 90 to a high of 201.   MENTAL STATUS EXAMINATION:  Reveals alert, cooperative male casually  dressed, pleasant.  Speech is clear, normal rate, tempo and production.  Mood is anxious.  Affect broad.  Thought processes logical, coherent and  relevant.  Endorsed he wants help.  He wants to feel better.  Endorsed  he is ready to  quit.   ADMITTING DIAGNOSES:  AXIS I:  Alcohol abuse.  Drug dependence.  Depressive disorder, not otherwise specified.  AXIS II:  No diagnosis.  AXIS III:  Hypertension.  Diabetes.  Hypercholesterolemia.  AXIS IV:  Moderate.  AXIS V:  Upon admission 35-40.  Highest GAF in the last year 60.   COURSE IN THE HOSPITAL:  He was admitted, started individual and group  psychotherapy.  He was detoxed with Librium.  He was initially  maintained on the Cymbalta, the Seroquel, the Lipitor, metformin,  aspirin and Ambien.  Eventually, the Seroquel was discontinued, and he  was placed on Abilify.  On March 23, 2009, he developed diarrhea, other  GI symptoms.  He was assessed to have a viral syndrome.  He continued to  be detoxed.  On March 25, 2009, he endorsed long history of depression  and anxiety since 2003, increased use of alcohol.  He saw Dr. Babs Sciara  early on, has been on Lexapro, Zoloft, Paxil, imipramine, lately  Cymbalta increased up to 60, was placed on Seroquel to augment but still  endorsed signs and symptoms of depression.  He is depressed, feels  stressed out by work, finances, lack of business.  He is self-employed.  He has seen increased use of alcohol, was in [facility] [employer], was  abstinent for several years.  We continued the Cymbalta as already  stated, and we switched to Abilify.  He continued to evidence the  diarrhea, but his vital signs and his electrolytes were within normal  limits.  Worried about the status of his business if he does not get  better.  Endorsed he really wanted to get his life back together.  We  continued to follow up closely in the degree of hydration.  By March 27, 2009, he had been able to tolerate food, reported decreasing vomiting  and the diarrhea, upset because he has not been able to get any help due  to developing the virus.  So, he has not been able to participate from  individual and group activities.  The status of affairs was that he  would  have to be isolated to his room for the next 48 hours, and he felt  he could not handle that.  There was communication with his wife.  The  thought was that he was going to be better at home.  He was not  clinically infectious, so there was no concern about his passing it to  his wife or any other family member.  So, we went ahead and discharged  him so he could recover at home and with recommendation to come to the  [facility] to work further on his alcohol addiction.   DISCHARGE DIAGNOSES:  AXIS I:  Alcohol dependence.  Depressive disorder,  not otherwise specified.  Anxiety disorder, not otherwise specified.  AXIS II:  No diagnosis.  AXIS III:  Hypertension.  Diabetes mellitus.  Hypercholesterolemia.  Status post viral gastrointestinal syndrome.  AXIS IV:  Moderate.  AXIS V:  Upon discharge 45-50.   MEDICATIONS:  1. Discharged on Cymbalta 60 mg per day.  2. Abilify 2 mg twice a day.  3. Aspirin 325 mg per day.  4. Lipitor 80 mg per day.  5. Glucophage 500 mg per day.  6. Ambien 10 at bedtime for sleep.   FOLLOWUP:  At [facility].      Geoffery Lyons, M.D.  Electronically Signed     IL/MEDQ  D:  04/09/2009  T:  04/09/2009  Job:  [id number]

## 2011-04-17 NOTE — Op Note (Signed)
   NAMEYUKIO, [name] Hall                          ACCOUNT NO.:  [id number]   MEDICAL RECORD NO.:  [id number]                   PATIENT TYPE:  AMB   LOCATION:  ENDO                                 [facility]ACILITY:  [facility]   PHYSICIAN:  Graylin Shiver, M.D.                DATE OF BIRTH:  Jan 07, 1946   DATE OF PROCEDURE:  10/04/2003  DATE OF DISCHARGE:                                 OPERATIVE REPORT   PROCEDURE:  Colonoscopy.   INDICATIONS FOR PROCEDURE:  Screening.   Informed consent was obtained after explanation of the risks of bleeding,  infection and perforation.   PREMEDICATION:  Fentanyl 50 mcg IV, Versed 6 mg IV.   DESCRIPTION OF PROCEDURE:  With the patient in the left lateral decubitus  position, a rectal exam was performed and no masses were felt. The Olympus  colonoscope was then inserted into the rectum and advanced around the colon  to the cecum. Cecal landmarks were identified. The cecum and ascending colon  were normal. The transverse colon was normal.  The descending colon, sigmoid  and rectum were normal. He tolerated the procedure well without  complications.   IMPRESSION:  Normal colonoscopy to the cecum.                                               Graylin Shiver, M.D.    [name]  D:  10/04/2003  T:  10/04/2003  Job:  [id number]

## 2011-04-17 NOTE — Assessment & Plan Note (Signed)
[facility]                              CARDIOLOGY OFFICE NOTE   YANIV, LAGE                       MRN:          [id number]  DATE:09/29/2006                            DOB:          04-05-1946    REFERRING PHYSICIAN:  Caryn Bee L. Little, M.D.   REASON FOR EVALUATION:  Cardiac risk stratification.   HISTORY OF PRESENT ILLNESS:  Mr. Dohrman is a pleasant 65 year old male with  no documented history of coronary artery disease, apparently with a  reassuring exercise treadmill test approximately 10 years ago.  He has a  history of hypertension, although at this time is not on antihypertensive  therapy, as well as hyperlipidemia.  Records indicate a significantly  elevated LDL cholesterol in the past up into the 200 range, previously  treated with Lipitor.  Recent screening blood work shows a total cholesterol  of 221, triglycerides of 85, LDL of 161, and HDL of 65.   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  He has a history of bilateral  hip replacement surgery and is limited by bilateral hip pain.  He was  previously playing tennis and golf, although he has had to cut out tennis  and has even had difficulty with golf recently due to pain in his hip and  groin area.  He has apparently had this evaluated recently and tells me that  no major abnormalities were noted with the prostheses, although he was  treated with anti-inflammatory and pain medication.   Symptomatically, he has not had any problems with obvious angina or dyspnea  on exertion.  Also denies palpitations, orthopnea, PND, and syncope.  Electrocardiogram today shows normal sinus rhythm at 75 beats per minute.  His electrocardiogram from September of last year showed sinus rhythm with  nonspecific ST-T wave changes.  We were asked to assist with further cardiac  risk stratification given his risk factor profile.   ALLERGIES:  SULFA DRUGS.   PRESENT MEDICATIONS:  1. Lexapro 10  mg p.o. daily.  2. Aspirin 81 mg p.o. daily.  3. It has also been recommended that he start on Lipitor.   PAST MEDICAL HISTORY:  As outlined above.  There is additional history of  depression.  Available history indicates history of elevated PSA in the  past.  PSA by recent blood work was 3.64 which is in the upper-normal range.  He additionally has history of bilateral hip replacement surgery, the left  in 2002 and the right in 2006.   SOCIAL HISTORY:  The patient is married.  He has 3 children.  He is a  Advertising account planner, Economist of the business.  He has a tobacco use history  of 1/2 pack per day for at least 20 years.  He drinks 2 alcoholic beverages  daily, 2 caffeinated beverages daily.  He is not exercising regularly at  this time.   FAMILY HISTORY:  Significant for heart disease in the patient's father in  his 28s.  It is not entirely clear that there is any premature  cardiovascular disease history noted, however.   REVIEW OF SYSTEMS:  As in History of Present Illness.  He also has had  recent diagnosis of varicose veins and spider veins and does have some  discomfort in his lower extremities.  He also tells me that he has a prior  history of deep vein thrombosis on the left following a long plane trip  weeks after one of his hip surgeries.   PHYSICAL EXAMINATION:  VITAL SIGNS: Blood pressure today is 140/84, heart  rate 72. Weight is 207 pounds.  GENERAL:  The patient is overweight, in no acute distress.  HEENT:  Conjunctivae and lids normal.  Pharynx is clear.  NECK:  Supple without elevated jugular venous pressure, without bruits.  No  thyromegaly is noted.  LUNGS:  Clear without labored breathing at rest.  CARDIAC:  Exam reveals a regular rate and rhythm without loud murmur or S3  gallop.  There is no pericardial rub.  ABDOMEN: Soft, nontender.  No obvious pulsatile mass or bruit.  No  hepatomegaly. Bowel sounds are present.  EXTREMITIES: Show no significant  pitting edema.  There are some spider veins  and varicosities of the lower extremities below the knees bilaterally.  Distal pulses are 2+ with the exception of the left dorsalis pedis which is  more diminished.  SKIN:  Otherwise warm and dry.  MUSCULOSKELETAL:  No kyphosis is noted.  NEUROPSYCHIATRIC:  The patient is alert and oriented x3.  Affect is normal.   IMPRESSION AND RECOMMENDATIONS:  77. A 65 year old male with history of hypertension, hyperlipidemia with an      LDL cholesterol of 161 and ongoing tobacco use, also peripheral      varicosities and the possibility of peripheral arterial disease.      Resting electrocardiogram is normal today.  He had a standard treadmill      test approximately 10 years ago.  For further risk stratification, we      discussed a repeat stress test.  Due to his hip pain, he is fairly      limited as far as his ability to ambulate on the treadmill, and we      have, therefore, scheduled an adenosine Myoview.  A dobutamine      echocardiogram could also be considered.  We will follow up on the      results and let the patient know regarding need for any additional      testing.  I otherwise discussed with him strategies for risk factor      modification, specifically smoking cessation and medical therapy for      his hypertension and hyperlipidemia.  I have also recommended that he      continue taking      the baby aspirin daily.  2. Further plans to follow.     Jonelle Sidle, MD  Electronically Signed    SGM/MedQ  DD: 09/29/2006  DT: 09/29/2006  Job #: [id number]   cc:   Caryn Bee L. Little, M.D.

## 2011-04-17 NOTE — Discharge Summary (Signed)
[place]  Patient:    [name] Hall, [name] Hall Visit Number: [id number] MRN: [id number]          Service Type: SUR Location: [id number] Attending Physician:  Colbert Ewing Dictated by:   Oris Drone Petrarca, P.A.-C. 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  DISCHARGE DIAGNOSES: 1. 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 2. Postoperative anemia.  PROCEDURES:  Left total knee replacement.  HISTORY OF PRESENT ILLNESS:  The patient is a 65 year old white male with persistent and progressive left hip pain, most recently worsening in symptoms. He is now having difficulty with activities of daily living.  He is now indicated for a left total hip replacement.  HOSPITAL COURSE:   This ______ -year-old white male admitted November 28, 2001.  After appropriate laboratory studies were obtained he was taken to the operating room where he underwent a left total hip replacement.  He tolerated the procedure well.  He was placed on heparin 5000 units subcutaneous q.12h. until his Coumadin became therapeutic.  He was placed pre- and postoperatively on Ancef 1 g IV q.8h.  A total of three doses was given postoperatively. Consultations with PT and OT were obtained.  He was then allowed to ambulate touchdown weightbearing on the left.  A Foley was placed intraoperatively.  He tolerated the procedure well.  He was allowed out of bed to a chair the following day.  He had developed on postoperative day #2 erythema about the anterior portion of the quad area.  Drainage was cultured from the incision site, which did not grow any bacteria.  He was placed on 1 g Ancef IV q.8h. He initially was placed on Keflex 500 p.o. q.i.d.  Because of this erythema he was kept in the hospital on IV antibiotics.  Presumptive diagnosis  was cellulitis.  This improved markedly on the antibiotic.  An Ace bandage spica was placed to the left hip after dressing changes.  He otherwise had an unremarkable hospital course and was discharged on December 04, 2001, to return back to our office in two weeks.  LABORATORY DATA:  EKG was read as normal sinus rhythm.  Radiographic studies showed that the left total hip prosthesis appears to be in good position in the AP view.  The lateral view appears normal at its distal aspect.  Prosthesis of the acetabular component cannot be visualized to interpret.  Preoperative hemoglobin 15.1, hematocrit 43.8%, white count 5900, platelets 232,000.  Discharge hemoglobin 11.1, hematocrit 31.9%, white count 6600, platelets 237,000.  Preoperative chemistries showed sodium 138, potassium 4.2, chloride 102, CO2 29, glucose 111, BUN 11, creatinine 1.0, calcium 9.8, total protein 7.2, albumin 3.9, AST 24, ALT 25, ALP 100, total bilirubin 0.6. Discharge chemistries showed sodium 138, potassium 4.1, chloride 100, CO2 29, glucose 137, BUN 7, creatinine 0.9, and calcium 8.8.  His cholesterol was 264, with triglycerides of 97.  PSA was 3.81.  Urinalysis was benign for a voided urine.  Blood type was O positive.  Antibody screen negative.  Wound culture of December 01, 2001, showed moderate wbcs present, predominantly mononuclear. No organisms seen, no growth for two days.  DISCHARGE MEDICATIONS:  He was given a prescription for: 1. Percocet 5 mg, 1-2 tablets every four  hours p.r.n. pain. 2. Iron sulfate 1 tablet daily. 3. Coumadin 5 mg tablet as directed by the pharmacy. 4. Keflex 500 mg 1 tablet t.i.d. for 10 days. 5. Continue routine medications.  DISCHARGE INSTRUCTIONS:  He may be up as tolerated with a walker.  Touchdown weightbearing on the left.  No diet restrictions at this time.  Keep his wound clean and dry.  Observe for infectious processes.  Home health PT and R.N. by Genevieve Norlander.  FOLLOW-UP:  He  will follow back up in the office two weeks postoperatively.  CONDITION ON DISCHARGE:  Improved. Dictated by:   Oris Drone Petrarca, P.A.-C. Attending Physician:  Colbert Ewing DD:  12/23/01 TD:  12/25/01 Job: [phone] [id number]

## 2011-04-17 NOTE — Op Note (Signed)
[place]  Patient:    LANIER, MILLON Visit Number: [id number] MRN: [id number]          Service Type: Attending:  Loreta Ave, M.D. Dictated by:   Loreta Ave, M.D. Proc. Date: 11/28/01                             Operative Report  PREOPERATIVE DIAGNOSIS:  End-stage degenerative arthritis, left hip.  POSTOPERATIVE DIAGNOSIS:  End-stage degenerative arthritis, left hip with extensive periarticular spurring.  OPERATION PERFORMED:  Left total hip replacement [employer] prosthesis. Press-fit 60 mm acetabular component screw fixation x 2 and 10 degree polyethylene insert.  Femoral component noncemented component size #11, 15 mm distal stem.  127 degree neck angle.  +5 femoral head.  Removal of periarticular spurs.  Curetting of degenerative cyst, acetabulum and packing of cancellous bone.  SURGEON:  Loreta Ave, M.D.  ASSISTANT:  Arlys John D. Petrarca, P.A.-C.  ANESTHESIA:  General.  ESTIMATED BLOOD LOSS:  300 cc.  BLOOD GIVEN:  None.  SPECIMENS:  Excised bone and soft tissue.  CULTURES:  None.  COMPLICATIONS:  None.  DRESSING:  Soft compressive with abduction pillow.  DESCRIPTION OF PROCEDURE:  The patient was brought to the operating room and placed on the operating table in supine position.  After adequate anesthesia had been obtained, turned to a lateral position left side up.   Left hip examined with markedly limited motion.  After being prepped and draped in the usual sterile fashion and incision made along the lateral aspect of the femur extending posterior superior.  Skin and subcutaneous divided, hemostasis obtained with cautery.  The iliotibial band exposed and incised under good visualization.  Charnley retractor put in place.  Neurovascular structures identified and protected.  External rotator and capsule taken down to the back of the intertrochanteric groove on the femur and tagged with #1 Ethibond.   Hip dislocated posteriorly.  Marked grade 4 changes throughout.  Femoral head removed one fingerbreadth above the lesser trochanter in line with the definitive prosthesis.  Acetabulum exposed.  Extensive exuberant periarticular spurs, some of which were broken off.  Were all excised back down to the normal acetabulum.  Sequential reaming with good medial and inferior placement and excellent bone stock.  Reamed up to 60 mm which gave good bleeding bone throughout.  Degenerative cyst superior and inferior in the acetabulum were curetted out and packed with cancellous bone yielded from reamings.  Wound was irrigated.  The 60 mm acetabular component hammered in place, placed in 45 degrees of abduction 20 of anteversion with excellent capturing.  This was augmented with a 20 mm screw above and a 16 mm screw posterosuperior placed through the cup and then firmly screwed down in place.  10 degree insert flexion, more than 45 degrees of internal rotation and 20 degrees of abduction without dislocation.  Not excessively tight in extension.  Wound irrigated. External rotators and capsule were repaired to the back of the intertrochanteric groove through drill holes and sutures tried over a bony bridge.  Charnley retractor removed.  The iliotibial band closed with #1 Vicryl.  Skin and subcutaneous tissues with Vicryl and staples.  Margins of the wound injected with Marcaine.  Sterile compressive dressing applied. Returned to supine position.  Abduction pillow placed.  Anesthesia reversed. Brought to recovery room.  Tolerated surgery well.  No complications. Dictated by:   Loreta Ave, M.D. Attending:  Reuel Boom  Georg Ruddle, M.D. DD:  11/28/01 TD:  11/28/01 Job: [id number]

## 2011-08-20 LAB — COMPREHENSIVE METABOLIC PANEL
AST: 58 — ABNORMAL HIGH
Albumin: 3.2 — ABNORMAL LOW
BUN: 11
Calcium: 9.1
Creatinine, Ser: 1.04
GFR calc Af Amer: >60
Total Bilirubin: 1.1
Total Protein: 6.2

## 2011-08-20 LAB — RAPID URINE DRUG SCREEN, HOSP PERFORMED
Amphetamines: NOT DETECTED
Barbiturates: NOT DETECTED
Benzodiazepines: POSITIVE — AB
Cocaine: NOT DETECTED
Opiates: NOT DETECTED
Tetrahydrocannabinol: NOT DETECTED

## 2011-08-20 LAB — URINALYSIS, ROUTINE W REFLEX MICROSCOPIC
Nitrite: NEGATIVE
Specific Gravity, Urine: 1.024
Urobilinogen, UA: 1
pH: 6

## 2011-08-20 LAB — CBC
HCT: 41.4
Hemoglobin: 14.1
MCHC: 34.1
MCV: 96.1
Platelets: 145 — ABNORMAL LOW
RBC: 4.31
RDW: 13.5
WBC: 4.4

## 2011-08-20 LAB — DIFFERENTIAL
Basophils Absolute: 0
Basophils Relative: 0
Eosinophils Absolute: 0.2
Eosinophils Relative: 4
Lymphocytes Relative: 17
Lymphs Abs: 0.8
Monocytes Absolute: 0.6
Monocytes Relative: 13 — ABNORMAL HIGH
Neutro Abs: 2.9
Neutrophils Relative %: 66

## 2011-08-20 LAB — CARDIAC PANEL(CRET KIN+CKTOT+MB+TROPI)
CK, MB: 1.1
Relative Index: INVALID
Relative Index: INVALID
Total CK: 30
Total CK: 39
Troponin I: 0.02

## 2011-08-20 LAB — PROTIME-INR
INR: 1
Prothrombin Time: 13

## 2011-08-20 LAB — HEMOGLOBIN A1C
Hgb A1c MFr Bld: 6.1
Mean Plasma Glucose: 140

## 2011-08-20 LAB — BASIC METABOLIC PANEL
Calcium: 8.8
GFR calc Af Amer: >60
GFR calc non Af Amer: >60
Glucose, Bld: 95
Potassium: 4.1
Sodium: 139

## 2011-08-20 LAB — URINE CULTURE
Colony Count: 3000
Special Requests: NEGATIVE

## 2011-08-20 LAB — APTT: aPTT: 25

## 2011-08-20 LAB — LIPID PANEL
LDL Cholesterol: 91
Total CHOL/HDL Ratio: 2.7
VLDL: 14

## 2011-08-21 LAB — DIFFERENTIAL
Eosinophils Absolute: 0.3
Eosinophils Relative: 9 — ABNORMAL HIGH
Lymphs Abs: 0.9
Monocytes Absolute: 0.5
Monocytes Relative: 14 — ABNORMAL HIGH

## 2011-08-21 LAB — CBC
MCHC: 34.9
RBC: 4.06 — ABNORMAL LOW
RDW: 13.6

## 2011-08-21 LAB — COMPREHENSIVE METABOLIC PANEL
ALT: 149 — ABNORMAL HIGH
AST: 206 — ABNORMAL HIGH
Calcium: 8.4
GFR calc Af Amer: >60
Potassium: 4.2
Sodium: 141
Total Protein: 5.8 — ABNORMAL LOW

## 2011-08-21 LAB — ETHANOL: Alcohol, Ethyl (B): 335 — ABNORMAL HIGH

## 2011-09-14 LAB — BASIC METABOLIC PANEL
CO2: 25
Calcium: 9
Chloride: 104
GFR calc Af Amer: >60
Potassium: 3.9
Sodium: 137

## 2011-09-14 LAB — CBC
HCT: 38.8 — ABNORMAL LOW
Hemoglobin: 13.3
MCHC: 34.3
MCV: 93.4
RBC: 4.15 — ABNORMAL LOW

## 2011-09-14 LAB — DIFFERENTIAL
Basophils Relative: 0
Monocytes Absolute: 0.7
Monocytes Relative: 12 — ABNORMAL HIGH
Neutro Abs: 4

## 2011-12-17 ENCOUNTER — Encounter (HOSPITAL_COMMUNITY): Payer: Self-pay | Admitting: *Deleted

## 2011-12-17 ENCOUNTER — Emergency Department (HOSPITAL_COMMUNITY)
Admission: EM | Admit: 2011-12-17 | Discharge: 2011-12-18 | Disposition: A | Discharge status: Other Acute Inpatient Hospital | Payer: 59 | Source: Home / Self Care | Attending: Emergency Medicine | Admitting: Emergency Medicine

## 2011-12-17 DIAGNOSIS — Z86711 Personal history of pulmonary embolism: Secondary | ICD-10-CM | POA: Insufficient documentation

## 2011-12-17 DIAGNOSIS — Z7901 Long term (current) use of anticoagulants: Secondary | ICD-10-CM | POA: Insufficient documentation

## 2011-12-17 DIAGNOSIS — E119 Type 2 diabetes mellitus without complications: Secondary | ICD-10-CM | POA: Insufficient documentation

## 2011-12-17 DIAGNOSIS — F1010 Alcohol abuse, uncomplicated: Secondary | ICD-10-CM | POA: Insufficient documentation

## 2011-12-17 DIAGNOSIS — Z86718 Personal history of other venous thrombosis and embolism: Secondary | ICD-10-CM | POA: Insufficient documentation

## 2011-12-17 DIAGNOSIS — Z79899 Other long term (current) drug therapy: Secondary | ICD-10-CM | POA: Insufficient documentation

## 2011-12-17 DIAGNOSIS — Z8546 Personal history of malignant neoplasm of prostate: Secondary | ICD-10-CM | POA: Insufficient documentation

## 2011-12-17 DIAGNOSIS — F17200 Nicotine dependence, unspecified, uncomplicated: Secondary | ICD-10-CM | POA: Insufficient documentation

## 2011-12-17 DIAGNOSIS — F1020 Alcohol dependence, uncomplicated: Secondary | ICD-10-CM

## 2011-12-17 HISTORY — DX: Malignant neoplasm of prostate: C61

## 2011-12-17 HISTORY — DX: Acute embolism and thrombosis of unspecified deep veins of unspecified lower extremity: I82.409

## 2011-12-17 HISTORY — DX: Other pulmonary embolism without acute cor pulmonale: I26.99

## 2011-12-17 LAB — COMPREHENSIVE METABOLIC PANEL
BUN: 14 mg/dL (ref 6–23)
Calcium: 8.9 mg/dL (ref 8.4–10.5)
GFR calc Af Amer: >90 mL/min (ref >90)
Glucose, Bld: 126 mg/dL — ABNORMAL HIGH (ref 70–99)
Sodium: 130 mEq/L — ABNORMAL LOW (ref 135–145)
Total Protein: 7.2 g/dL (ref 6.0–8.3)

## 2011-12-17 LAB — ETHANOL: Alcohol, Ethyl (B): 135 mg/dL — ABNORMAL HIGH (ref 0–11)

## 2011-12-17 LAB — GLUCOSE, CAPILLARY: Glucose-Capillary: 115 mg/dL — ABNORMAL HIGH (ref 70–99)

## 2011-12-17 LAB — CBC
Hemoglobin: 14.9 g/dL (ref 13.0–17.0)
MCH: 30.9 pg (ref 26.0–34.0)
MCHC: 34.7 g/dL (ref 30.0–36.0)

## 2011-12-17 LAB — RAPID URINE DRUG SCREEN, HOSP PERFORMED
Cocaine: NOT DETECTED
Opiates: NOT DETECTED

## 2011-12-17 LAB — ACETAMINOPHEN LEVEL: Acetaminophen (Tylenol), Serum: <15 ug/mL (ref 10–30)

## 2011-12-17 MED ORDER — SODIUM CHLORIDE 0.9 % IV BOLUS (SEPSIS): 1000.0000 mL | Freq: Once | INTRAVENOUS | Status: DC

## 2011-12-17 MED ORDER — ALUM & MAG HYDROXIDE-SIMETH 200-200-20 MG/5ML PO SUSP: 30.0000 mL | ORAL | Status: DC | PRN

## 2011-12-17 MED ORDER — ADULT MULTIVITAMIN W/MINERALS CH
1.0000 | ORAL_TABLET | Freq: Every day | ORAL | Status: DC
  Administered 2011-12-17 – 2011-12-18 (×2): 1 via ORAL
  Filled 2011-12-17 (×2): qty 1

## 2011-12-17 MED ORDER — LORAZEPAM 2 MG/ML IJ SOLN: 1.0000 mg | Freq: Four times a day (QID) | INTRAMUSCULAR | Status: DC | PRN

## 2011-12-17 MED ORDER — LORAZEPAM 1 MG PO TABS
1.0000 mg | ORAL_TABLET | Freq: Four times a day (QID) | ORAL | Status: DC | PRN
  Administered 2011-12-17: 1 mg via ORAL
  Filled 2011-12-17: qty 1

## 2011-12-17 MED ORDER — LORAZEPAM 1 MG PO TABS: 0.0000 mg | ORAL_TABLET | Freq: Two times a day (BID) | ORAL | Status: DC

## 2011-12-17 MED ORDER — THIAMINE HCL 100 MG/ML IJ SOLN: 100.0000 mg | Freq: Every day | INTRAMUSCULAR | Status: DC

## 2011-12-17 MED ORDER — FOLIC ACID 1 MG PO TABS
1.0000 mg | ORAL_TABLET | Freq: Every day | ORAL | Status: DC
  Administered 2011-12-17 – 2011-12-18 (×2): 1 mg via ORAL
  Filled 2011-12-17 (×2): qty 1

## 2011-12-17 MED ORDER — ACETAMINOPHEN 325 MG PO TABS: 650.0000 mg | ORAL_TABLET | ORAL | Status: DC | PRN

## 2011-12-17 MED ORDER — ONDANSETRON HCL 4 MG PO TABS: 4.0000 mg | ORAL_TABLET | Freq: Three times a day (TID) | ORAL | Status: DC | PRN

## 2011-12-17 MED ORDER — LORAZEPAM 1 MG PO TABS
0.0000 mg | ORAL_TABLET | Freq: Four times a day (QID) | ORAL | Status: DC
  Administered 2011-12-18: 1 mg via ORAL
  Filled 2011-12-17: qty 1

## 2011-12-17 MED ORDER — VITAMIN B-1 100 MG PO TABS
100.0000 mg | ORAL_TABLET | Freq: Every day | ORAL | Status: DC
  Administered 2011-12-17 – 2011-12-18 (×2): 100 mg via ORAL
  Filled 2011-12-17 (×2): qty 1

## 2011-12-17 MED ORDER — POTASSIUM CHLORIDE CRYS ER 20 MEQ PO TBCR
20.0000 meq | EXTENDED_RELEASE_TABLET | Freq: Once | ORAL | Status: AC
  Administered 2011-12-17: 20 meq via ORAL
  Filled 2011-12-17: qty 1

## 2011-12-17 NOTE — ED Provider Notes (Signed)
History     CSN: [id number]  Arrival date & time 12/17/11  1634   First MD Initiated Contact with Patient 12/17/11 1849      Chief Complaint  Patient presents with  . Alcohol Problem    pt reports drinking 4-22oz beers today. reports hx of alcohol abuse. pt requesting detox. pt reports last drink 2 hrs PTA.     (Consider location/radiation/quality/duration/timing/severity/associated sxs/prior treatment) Patient is a 66 y.o. male presenting with intoxication. The history is provided by the patient.  Alcohol Intoxication This is a recurrent (remote hx alcohol addiction, in AA, relapse 1.5 weeks ago) problem. Episode frequency: has been drinking 4-22oz beers each day. The problem has been unchanged. Pertinent negatives include no abdominal pain, chest pain, chills, congestion, coughing, fever, headaches, joint swelling, nausea, neck pain, numbness, rash, sore throat, visual change, vomiting or weakness. Associated symptoms comments: Positive diarrhea. The symptoms are aggravated by nothing. He has tried nothing for the symptoms.    Past Medical History  Diagnosis Date  . Diabetes mellitus   . Prostate ca   . DVT (deep venous thrombosis)   . PE (pulmonary embolism)     Past Surgical History  Procedure Date  . Total hip arthroplasty     History reviewed. No pertinent family history.  History  Substance Use Topics  . Smoking status: Current Everyday Smoker    Types: Cigarettes  . Smokeless tobacco: Not on file  . Alcohol Use: Yes      Review of Systems  Constitutional: Negative for fever and chills.  HENT: Negative for congestion, sore throat and neck pain.   Eyes: Negative for pain and visual disturbance.  Respiratory: Negative for cough and shortness of breath.   Cardiovascular: Negative for chest pain.  Gastrointestinal: Negative for nausea, vomiting and abdominal pain.  Genitourinary: Negative for dysuria, hematuria and flank pain.  Musculoskeletal: Negative for  back pain, joint swelling and gait problem.  Skin: Negative for rash.  Neurological: Negative for weakness, numbness and headaches.  Psychiatric/Behavioral: Negative for suicidal ideas and confusion.       See HPI. Denies HI, SI, hallucinations    Allergies  Sulfonamide derivatives  Home Medications   Current Outpatient Rx  Name Route Sig Dispense Refill  . DESVENLAFAXINE SUCCINATE ER 100 MG PO TB24 Oral Take 100 mg by mouth daily.    . IBUPROFEN 200 MG PO TABS Oral Take 400 mg by mouth every 6 (six) hours as needed.    . WARFARIN SODIUM 7.5 MG PO TABS Oral Take 7.5 mg by mouth daily.      BP 134/71  Pulse 88  Temp(Src) 98.8 F (37.1 C) (Oral)  Resp 20  SpO2 100%  Physical Exam  Nursing note and vitals reviewed. Constitutional: He is oriented to person, place, and time. He appears well-developed and well-nourished. No distress.  HENT:  Head: Normocephalic and atraumatic.  Right Ear: External ear normal.  Left Ear: External ear normal.  Nose: Nose normal.  Mouth/Throat: Oropharynx is clear and moist. No oropharyngeal exudate.  Eyes: Pupils are equal, round, and reactive to light.       Mild bilateral conjunctival injection  Neck: Normal range of motion. Neck supple.  Cardiovascular: Normal rate, regular rhythm and intact distal pulses.   No murmur heard. Pulmonary/Chest: Effort normal and breath sounds normal. No respiratory distress. He has no wheezes. He exhibits no tenderness.  Abdominal: Soft. Bowel sounds are normal. He exhibits no distension. There is no tenderness.  Musculoskeletal: He exhibits  no edema and no tenderness.  Neurological: He is alert and oriented to person, place, and time. No cranial nerve deficit. Coordination normal.       Gait steady without ataxia  Skin: Skin is warm and dry. No rash noted.  Psychiatric: He has a normal mood and affect.    ED Course  Procedures (including critical care time)  Labs Reviewed  COMPREHENSIVE METABOLIC PANEL -  Abnormal; Notable for the following:    Sodium 130 (*)    Potassium 3.4 (*)    Chloride 94 (*)    Glucose, Bld 126 (*)    Alkaline Phosphatase 133 (*)    GFR calc non Af Amer 85 (*)    All other components within normal limits  ETHANOL - Abnormal; Notable for the following:    Alcohol, Ethyl (B) 135 (*)    All other components within normal limits  GLUCOSE, CAPILLARY - Abnormal; Notable for the following:    Glucose-Capillary 115 (*)    All other components within normal limits  CBC  ACETAMINOPHEN LEVEL  URINE RAPID DRUG SCREEN (HOSP PERFORMED)  POCT CBG MONITORING   No results found.   Dx 1: Alcohol Abuse   MDM  7:30 PM Pt seen ant evaluated. PT/INR added to labs  To ensure coumadin level appropriate. Saline bolus and KCL PO ordered to replenish electrolyte deficiencies. Pt not tremulous, does not appear to be in DTs.    10:01 PM Spoke with ACT team, who will come to assess patient.  Elwyn Reach [place], [place] 12/18/11 [id number]

## 2011-12-17 NOTE — ED Notes (Addendum)
3 yellow-colored rings and 1 yellow colored watch removed, placed in specimen cup, and labeled. Locked in TCU locker #31.

## 2011-12-17 NOTE — ED Notes (Signed)
Pt attempted to urinate, unable to at present.

## 2011-12-17 NOTE — ED Notes (Signed)
Pt presents from TCU requesting Detox from alcohol.  Pt reports recently he has been drinking at least a 6 pack of beer per day.  Denies drug use.  Denies suicidal or homicidal ideations.  Pt calm & cooperative at present.

## 2011-12-17 NOTE — ED Notes (Signed)
Pts belongings sent home with wife.

## 2011-12-18 ENCOUNTER — Inpatient Hospital Stay (HOSPITAL_COMMUNITY)
Admission: EM | Admit: 2011-12-18 | Discharge: 2011-12-21 | DRG: 897 | Disposition: A | Discharge status: Home / Self Care | Payer: 59 | Source: Ambulatory Visit | Attending: Psychiatry | Admitting: Psychiatry

## 2011-12-18 ENCOUNTER — Encounter (HOSPITAL_COMMUNITY): Payer: Self-pay | Admitting: *Deleted

## 2011-12-18 DIAGNOSIS — F1020 Alcohol dependence, uncomplicated: Principal | ICD-10-CM

## 2011-12-18 DIAGNOSIS — Z86711 Personal history of pulmonary embolism: Secondary | ICD-10-CM

## 2011-12-18 DIAGNOSIS — Z882 Allergy status to sulfonamides status: Secondary | ICD-10-CM

## 2011-12-18 DIAGNOSIS — Z8546 Personal history of malignant neoplasm of prostate: Secondary | ICD-10-CM

## 2011-12-18 DIAGNOSIS — Z6827 Body mass index (BMI) 27.0-27.9, adult: Secondary | ICD-10-CM

## 2011-12-18 DIAGNOSIS — Z79899 Other long term (current) drug therapy: Secondary | ICD-10-CM

## 2011-12-18 DIAGNOSIS — F17200 Nicotine dependence, unspecified, uncomplicated: Secondary | ICD-10-CM

## 2011-12-18 DIAGNOSIS — Z86718 Personal history of other venous thrombosis and embolism: Secondary | ICD-10-CM

## 2011-12-18 DIAGNOSIS — Z7901 Long term (current) use of anticoagulants: Secondary | ICD-10-CM

## 2011-12-18 DIAGNOSIS — E119 Type 2 diabetes mellitus without complications: Secondary | ICD-10-CM

## 2011-12-18 DIAGNOSIS — F1010 Alcohol abuse, uncomplicated: Secondary | ICD-10-CM | POA: Diagnosis present

## 2011-12-18 MED ORDER — MAGNESIUM HYDROXIDE 400 MG/5ML PO SUSP: 30.0000 mL | Freq: Every day | ORAL | Status: DC | PRN

## 2011-12-18 MED ORDER — THIAMINE HCL 100 MG/ML IJ SOLN: 100.0000 mg | Freq: Once | INTRAMUSCULAR | Status: DC

## 2011-12-18 MED ORDER — CHLORDIAZEPOXIDE HCL 25 MG PO CAPS
25.0000 mg | ORAL_CAPSULE | ORAL | Status: AC
  Administered 2011-12-20 (×2): 25 mg via ORAL
  Filled 2011-12-18 (×2): qty 1

## 2011-12-18 MED ORDER — ACETAMINOPHEN 325 MG PO TABS: 650.0000 mg | ORAL_TABLET | Freq: Four times a day (QID) | ORAL | Status: DC | PRN

## 2011-12-18 MED ORDER — LOPERAMIDE HCL 2 MG PO CAPS: 2.0000 mg | ORAL_CAPSULE | ORAL | Status: AC | PRN

## 2011-12-18 MED ORDER — ADULT MULTIVITAMIN W/MINERALS CH
1.0000 | ORAL_TABLET | Freq: Every day | ORAL | Status: DC
  Administered 2011-12-18 – 2011-12-21 (×4): 1 via ORAL
  Filled 2011-12-18 (×4): qty 1

## 2011-12-18 MED ORDER — CHLORDIAZEPOXIDE HCL 25 MG PO CAPS
50.0000 mg | ORAL_CAPSULE | Freq: Once | ORAL | Status: AC
  Administered 2011-12-18: 50 mg via ORAL
  Filled 2011-12-18: qty 2

## 2011-12-18 MED ORDER — HYDROCORTISONE VALERATE 0.2 % EX CREA
TOPICAL_CREAM | Freq: Two times a day (BID) | CUTANEOUS | Status: DC
  Administered 2011-12-18 – 2011-12-21 (×6): via TOPICAL
  Filled 2011-12-18 (×2): qty 15

## 2011-12-18 MED ORDER — VITAMIN B-1 100 MG PO TABS
100.0000 mg | ORAL_TABLET | Freq: Every day | ORAL | Status: DC
  Administered 2011-12-19 – 2011-12-21 (×3): 100 mg via ORAL
  Filled 2011-12-18 (×5): qty 1

## 2011-12-18 MED ORDER — WARFARIN SODIUM 7.5 MG PO TABS
7.5000 mg | ORAL_TABLET | Freq: Once | ORAL | Status: AC
  Administered 2011-12-18: 7.5 mg via ORAL
  Filled 2011-12-18: qty 1

## 2011-12-18 MED ORDER — CHLORDIAZEPOXIDE HCL 25 MG PO CAPS
25.0000 mg | ORAL_CAPSULE | Freq: Three times a day (TID) | ORAL | Status: AC
  Administered 2011-12-19 (×3): 25 mg via ORAL
  Filled 2011-12-18 (×3): qty 1

## 2011-12-18 MED ORDER — ONDANSETRON 4 MG PO TBDP: 4.0000 mg | ORAL_TABLET | Freq: Four times a day (QID) | ORAL | Status: AC | PRN

## 2011-12-18 MED ORDER — CHLORDIAZEPOXIDE HCL 25 MG PO CAPS
25.0000 mg | ORAL_CAPSULE | Freq: Four times a day (QID) | ORAL | Status: AC
  Administered 2011-12-18 (×2): 25 mg via ORAL
  Filled 2011-12-18 (×2): qty 1

## 2011-12-18 MED ORDER — VENLAFAXINE HCL ER 75 MG PO CP24
75.0000 mg | ORAL_CAPSULE | Freq: Every day | ORAL | Status: DC
  Filled 2011-12-18: qty 1

## 2011-12-18 MED ORDER — ALUM & MAG HYDROXIDE-SIMETH 200-200-20 MG/5ML PO SUSP: 30.0000 mL | ORAL | Status: DC | PRN

## 2011-12-18 MED ORDER — ROSUVASTATIN CALCIUM 40 MG PO TABS
40.0000 mg | ORAL_TABLET | Freq: Every day | ORAL | Status: DC
  Administered 2011-12-18 – 2011-12-20 (×3): 40 mg via ORAL
  Filled 2011-12-18 (×2): qty 1
  Filled 2011-12-18: qty 2
  Filled 2011-12-18 (×3): qty 1

## 2011-12-18 MED ORDER — IBUPROFEN 200 MG PO TABS: 400.0000 mg | ORAL_TABLET | Freq: Four times a day (QID) | ORAL | Status: DC | PRN

## 2011-12-18 MED ORDER — CHLORDIAZEPOXIDE HCL 25 MG PO CAPS: 25.0000 mg | ORAL_CAPSULE | Freq: Four times a day (QID) | ORAL | Status: AC | PRN

## 2011-12-18 MED ORDER — WARFARIN SODIUM 7.5 MG PO TABS: 7.5000 mg | ORAL_TABLET | Freq: Every day | ORAL | Status: DC

## 2011-12-18 MED ORDER — CHLORDIAZEPOXIDE HCL 25 MG PO CAPS
25.0000 mg | ORAL_CAPSULE | Freq: Every day | ORAL | Status: AC
  Administered 2011-12-21: 25 mg via ORAL
  Filled 2011-12-18: qty 1

## 2011-12-18 MED ORDER — HYDROXYZINE HCL 25 MG PO TABS: 25.0000 mg | ORAL_TABLET | Freq: Four times a day (QID) | ORAL | Status: AC | PRN

## 2011-12-18 MED ORDER — METFORMIN HCL 500 MG PO TABS
500.0000 mg | ORAL_TABLET | Freq: Every day | ORAL | Status: DC
  Administered 2011-12-19 – 2011-12-21 (×3): 500 mg via ORAL
  Filled 2011-12-18 (×5): qty 1

## 2011-12-18 MED ORDER — TRAZODONE HCL 50 MG PO TABS
50.0000 mg | ORAL_TABLET | Freq: Every day | ORAL | Status: DC
  Administered 2011-12-18 – 2011-12-20 (×3): 50 mg via ORAL
  Filled 2011-12-18 (×5): qty 1

## 2011-12-18 NOTE — Progress Notes (Signed)
[facility] Group Notes:  (Counselor/Nursing/MHT/Case Management/Adjunct)  12/18/2011 3:14 PM  Type of Therapy:  Group Therapy  Participation Level:  Active  Participation Quality:  Sharing  Affect:  Appropriate  Cognitive:  Appropriate  Insight:  Limited  Engagement in Group:  Good  Engagement in Therapy:  Good  Modes of Intervention:  Problem-solving, Support and exploration  Summary of Progress/Problems: Group explored life after recovery- pt states he has to learn to handle the peaks and valley's of life and continue with A.A pt shared he is stuck on step 4. Vanetta Mulders, LPCA     Garret Reddish 12/18/2011, 3:14 PM

## 2011-12-18 NOTE — Progress Notes (Addendum)
Patient ID: [name] Hall, male   DOB: 16-Jul-1946, 66 y.o.   MRN: [id number] Patient's second admission to [facility].   Had been admitted to [facility] in spring of 2010.  Presently denies SI & HI, denies A/V hallucinations, denies pain.   Contracts for safety.  History of left hip replacement 2002, right hip replacement 2006.  Prostrate cancer treatment in Jan-March 2012.   Denied drug use.  Drinks beer daily.   Was in [facility] 06/2008 for 30 days.  Clean 6 months, then came to [facility],  and went to [facility] last summer.  Started drinking beer again after Christmas 2012.  Drinks approximately 8-10 beers daily, denied wine/liquor.   Denied drug use.   Smokes approximately one pack cigarettes daily.  Declined nicotine patch.  Eczema itchy/scratching on bilateral lower legs.  Has 3 grown sons, age 63, 45, 8 years.  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  Reading glasses at home.  Patient offered food and oriented to unit.   Patient has been cooperative, pleasant and alert.

## 2011-12-18 NOTE — Progress Notes (Signed)
ANTICOAGULATION CONSULT NOTE - Initial Consult  Pharmacy Consult for coumadin Indication: DVT  Allergies  Allergen Reactions  . Sulfonamide Derivatives     REACTION: itching    Patient Measurements: Height: 6' (182.9 cm) Weight: 205 lb (92.987 kg) IBW/kg (Calculated) : 77.6    Vital Signs: Temp: 97.4 F (36.3 C) (01/18 1205) Temp src: Oral (01/18 1205) BP: 122/87 mmHg (01/18 1208) Pulse Rate: 98  (01/18 1208)  Labs:  Basename 12/17/11 1922 12/17/11 1719  HGB -- 14.9  HCT -- 42.9  PLT -- 168  APTT -- --  LABPROT 32.1* --  INR 3.06* --  HEPARINUNFRC -- --  CREATININE -- 0.96  CKTOTAL -- --  CKMB -- --  TROPONINI -- --   Estimated Creatinine Clearance: 84.2 ml/min (by C-G formula based on Cr of 0.96).  Medical History: Past Medical History  Diagnosis Date  . Diabetes mellitus   . Prostate ca   . DVT (deep venous thrombosis)   . PE (pulmonary embolism)     Medications:  Prescriptions prior to admission  Medication Sig Dispense Refill  . desvenlafaxine (PRISTIQ) 100 MG 24 hr tablet Take 100 mg by mouth daily.      Marland Kitchen ibuprofen (ADVIL,MOTRIN) 200 MG tablet Take 400 mg by mouth every 6 (six) hours as needed.      . warfarin (COUMADIN) 7.5 MG tablet Take 7.5 mg by mouth daily.        Assessment: Normal protocol.  INR just above 3 at 3.02 in ER. No coumadin charted in ER record so INR should fall below 3.0 as a result of missed dose.  Will speak with patient regarding Coumadin therapy and provide eduction if indicated.  Goal of Therapy:  INR 2-3   Plan:  Will give Coumadin 7.5 mg tonight and check PT/INR am labs 1/19.  Charyl Dancer 12/18/2011,1:05 PM

## 2011-12-18 NOTE — [facility] Suicide Risk Assessment (Signed)
Suicide Risk Assessment  Admission Assessment     Demographic factors:  Assessment Details Time of Assessment: Admission Information Obtained From: Patient  Current Mental Status: Patient seen and evaluated. Chart reviewed. Patient stated that his mood was "good". His affect was mood congruent and euthymic. He denied any current thoughts of self injurious behavior, suicidal ideation or homicidal ideation. He denied any significant depressive signs or symptoms at this time. There were no auditory or visual hallucinations, paranoia, delusional thought processes, or mania noted.  Thought process was linear and goal directed.  No psychomotor agitation or retardation was noted. His speech was normal rate, tone and volume. Eye contact was good. Judgment and insight are fair.  Patient has been up and engaged on the unit.  No safety concerns reported from team.   Loss Factors:  Loss Factors: Decline in physical health;Financial problems / change in socioeconomic status  Historical Factors:  Historical Factors: Family history of mental illness or substance abuse;Impulsivity  Risk Reduction Factors:  Risk Reduction Factors: Sense of responsibility to family;Religious beliefs about death;Employed;Living with another person, especially a relative;Positive social support;Positive therapeutic relationship; connected with AA, has a sponsor; completed residential Tx in past with 66m sobriety; has outpt providers  CLINICAL FACTORS: Alcohol Dependence & Withdrawal; r/o SIMD   COGNITIVE FEATURES THAT CONTRIBUTE TO RISK: none.  SUICIDE RISK: Patient is currently viewed as a low risk of harm to himself and others in light of his history and risk factors. There are no acute safety concerns and he is stable for admission.    Meds:   . chlordiazePOXIDE  25 mg Oral QID   Followed by  . chlordiazePOXIDE  25 mg Oral TID   Followed by  . chlordiazePOXIDE  25 mg Oral BH-qamhs   Followed by  . chlordiazePOXIDE  25 mg  Oral Daily  . chlordiazePOXIDE  50 mg Oral Once  . hydrocortisone valerate cream   Topical BID  . metFORMIN  500 mg Oral Q breakfast  . mulitivitamin with minerals  1 tablet Oral Daily  . rosuvastatin  40 mg Oral Daily  . thiamine  100 mg Intramuscular Once  . thiamine  100 mg Oral Daily  . traZODone  50 mg Oral QHS  . venlafaxine  75 mg Oral Q breakfast  . warfarin  7.5 mg Oral ONCE-1800  . warfarin  7.5 mg Oral Daily    PLAN OF CARE: Pt admitted for crisis stabilization and treatment.  Please see orders.   Medications reviewed with pt and medication education provided.  Will continue q15 minute checks per unit protocol.  No clinical indication for one on one level of observation at this time.  Pt contracting for safety.  Mental health treatment, medication management and continued sobriety will mitigate against any future increased risk of harm to self and/or others.  Discussed the importance of recovery with pt, as well as, tools to move forward in a healthy & safe manner.  Pt agreeable with the plan.  Discussed with the team.   Lupe Carney 12/18/2011, 5:24 PM

## 2011-12-18 NOTE — BH Assessment (Signed)
Assessment Note   [name] Hall is a 66 y.o. male who presents to [facility] for detox from alcohol, denies SI/HI/Psych.  Pt reports relapsed on alcohol last Friday(12/11/11) after being sober for 6 mos.  Pt states cause of relapse is work related stress, does not note any other additional issues at this time.  Pt says has good support system with spouse and AA Partners.  Pt is not experiencing any withdrawal symptoms at this time.  Pt has no issues with blackouts or seizures.  Pt is on medications, last dose 12/17/11.  Info sent to [facility] to review for inpt admission.   Axis I: Alcohol Abuse Axis II: Deferred Axis III:  Past Medical History  Diagnosis Date  . Diabetes mellitus   . Prostate ca   . DVT (deep venous thrombosis)   . PE (pulmonary embolism)    Axis IV: other psychosocial or environmental problems Axis V: 51-60 moderate symptoms  Past Medical History:  Past Medical History  Diagnosis Date  . Diabetes mellitus   . Prostate ca   . DVT (deep venous thrombosis)   . PE (pulmonary embolism)     Past Surgical History  Procedure Date  . Total hip arthroplasty     Family History: History reviewed. No pertinent family history.  Social History:  reports that he has been smoking Cigarettes.  He does not have any smokeless tobacco history on file. He reports that he drinks alcohol. He reports that he does not use illicit drugs.  Additional Social History:  Alcohol / Drug Use Pain Medications: None  Prescriptions: None  Over the Counter: None  History of alcohol / drug use?: Yes Substance #1 Name of Substance 1: Alcohol--Beer 1 - Age of First Use: 19 YOM  1 - Amount (size/oz): 4-22oz's  1 - Frequency: Daily  1 - Duration: On-going  1 - Last Use / Amount: 12/17/11 Allergies:  Allergies  Allergen Reactions  . Sulfonamide Derivatives     REACTION: itching    Home Medications:  Medications Prior to Admission  Medication Dose Route Frequency Provider Last Rate Last Dose  .  acetaminophen (TYLENOL) tablet 650 mg  650 mg Oral Q4H PRN Shaaron Adler, PA      . alum & mag hydroxide-simeth (MAALOX/MYLANTA) 200-200-20 MG/5ML suspension 30 mL  30 mL Oral PRN Shaaron Adler, PA      . folic acid (FOLVITE) tablet 1 mg  1 mg Oral Daily Shaaron Adler, [place]   1 mg at 12/17/11 2149  . LORazepam (ATIVAN) tablet 1 mg  1 mg Oral Q6H PRN Shaaron Adler, PA   1 mg at 12/17/11 2226   Or  . LORazepam (ATIVAN) injection 1 mg  1 mg Intravenous Q6H PRN Shaaron Adler, [place]      . LORazepam (ATIVAN) tablet 0-4 mg  0-4 mg Oral Q6H Shaaron Adler, [place]       Followed by  . LORazepam (ATIVAN) tablet 0-4 mg  0-4 mg Oral Q12H Elwyn Reach [place], [place]      . mulitivitamin with minerals tablet 1 tablet  1 tablet Oral Daily Elwyn Reach [place], [place]   1 tablet at 12/17/11 2149  . ondansetron (ZOFRAN) tablet 4 mg  4 mg Oral Q8H PRN Shaaron Adler, PA      . potassium chloride SA (K-DUR,KLOR-CON) CR tablet 20 mEq  20 mEq Oral Once Shaaron Adler, [place]   20 mEq at 12/17/11 2148  . sodium chloride 0.9 %  bolus 1,000 mL  1,000 mL Intravenous Once Shaaron Adler, [place]      . thiamine (VITAMIN B-1) tablet 100 mg  100 mg Oral Daily Shaaron Adler, [place]   100 mg at 12/17/11 2148   Or  . thiamine (B-1) injection 100 mg  100 mg Intravenous Daily Shaaron Adler, [place]       No current outpatient prescriptions on file as of 12/17/2011.    OB/GYN Status:  No LMP for male patient.  General Assessment Data Location of Assessment: [facility] Living Arrangements: Spouse/significant other Can pt return to current living arrangement?: Yes Admission Status: Voluntary Is patient capable of signing voluntary admission?: Yes Transfer from: Acute Hospital Referral Source: MD  Education Status Is patient currently in school?: No Current Grade: None  Highest grade of school patient has completed: Unk  Name of school: None    Contact person: None   Risk to self Suicidal Ideation: No Suicidal Intent: No Is patient at risk for suicide?: No Suicidal Plan?: No Access to Means: No What has been your use of drugs/alcohol within the last 12 months?: SA-Alcohol since teens  Previous Attempts/Gestures: No How many times?: 0  Other Self Harm Risks: None  Triggers for Past Attempts: Other (Comment) (Work related stress ) Intentional Self Injurious Behavior: None Family Suicide History: No Recent stressful life event(s): Other (Comment) (Self employed; Work related stress ) Persecutory voices/beliefs?: No Depression: Yes Depression Symptoms: Loss of interest in usual pleasures Substance abuse history and/or treatment for substance abuse?: Yes Suicide prevention information given to non-admitted patients: Not applicable  Risk to Others Homicidal Ideation: No Thoughts of Harm to Others: No Current Homicidal Intent: No Current Homicidal Plan: No Access to Homicidal Means: No Identified Victim: None  History of harm to others?: No Assessment of Violence: None Noted Violent Behavior Description: None  Does patient have access to weapons?: No Criminal Charges Pending?: No Does patient have a court date: No  Psychosis Hallucinations: None noted Delusions: None noted  Mental Status Report Appear/Hygiene: Improved Eye Contact: Good Motor Activity: Unremarkable Speech: Logical/coherent Level of Consciousness: Alert Mood: Depressed Affect: Sad Anxiety Level: None Thought Processes: Coherent;Relevant Judgement: Unimpaired Orientation: Person;Place;Time;Situation Obsessive Compulsive Thoughts/Behaviors: None  Cognitive Functioning Concentration: Normal Memory: Recent Intact;Remote Intact IQ: Average Insight: Good Impulse Control: Good Appetite: Good Weight Loss: 0  Weight Gain: 0  Sleep: No Change Total Hours of Sleep: 8  Vegetative Symptoms: None  Prior Inpatient Therapy Prior Inpatient  Therapy: Yes Prior Therapy Dates: 2009-2012 Prior [facility]ilty[facility]Provider[facility]): [facility], [facility] Reason for Treatment: Alcohol Dep   Prior Outpatient Therapy Prior Outpatient Therapy: Yes Prior Therapy Dates: Current  Prior Therapy Facilty/Provider(s): [facility]  Reason for Treatment: Maint   ADL Screening (condition at time of admission) Patient's cognitive ability adequate to safely complete daily activities?: Yes Patient able to express need for assistance with ADLs?: Yes Independently performs ADLs?: Yes Weakness of Legs: None Weakness of Arms/Hands: None       Abuse/Neglect Assessment (Assessment to be complete while patient is alone) Physical Abuse: Denies Verbal Abuse: Denies Sexual Abuse: Denies Exploitation of patient/patient's resources: Denies Self-Neglect: Denies Values / Beliefs Cultural Requests During Hospitalization: None Spiritual Requests During Hospitalization: None Consults Spiritual Care Consult Needed: No Social Work Consult Needed: No Merchant navy officer (For Healthcare) Advance Directive: Patient does not have advance directive;Patient would not like information Pre-existing out of facility DNR order (yellow form or pink MOST form): No    Additional Information 1:1  In Past 12 Months?: No CIRT Risk: No Elopement Risk: No Does patient have medical clearance?: Yes     Disposition:  Disposition Disposition of Patient: Inpatient treatment program;Referred to [facility] ) Type of inpatient treatment program: Adult Patient referred to: Other (Comment) [facility])  On Site Evaluation by:   Reviewed with Physician:     Murrell Redden 12/18/2011 12:28 AM

## 2011-12-18 NOTE — Progress Notes (Signed)
[facility] Group Notes:  (Counselor/Nursing/MHT/Case Management/Adjunct)  12/18/2011 1:01 PM  Type of Therapy:  Group Therapy  Participation Level:  Did Not Attend   , LPCA     Leigh  12/18/2011, 1:01 PM 

## 2011-12-18 NOTE — ED Notes (Signed)
Given Ativan 1 mg d/t CIWA of 9, anxious and agitated

## 2011-12-19 DIAGNOSIS — F1010 Alcohol abuse, uncomplicated: Secondary | ICD-10-CM

## 2011-12-19 MED ORDER — DESVENLAFAXINE SUCCINATE ER 50 MG PO TB24: 50.0000 mg | ORAL_TABLET | Freq: Every day | ORAL | Status: DC

## 2011-12-19 MED ORDER — DESVENLAFAXINE SUCCINATE ER 100 MG PO TB24
100.0000 mg | ORAL_TABLET | Freq: Every day | ORAL | Status: DC
  Administered 2011-12-19 – 2011-12-21 (×3): 100 mg via ORAL
  Filled 2011-12-19 (×5): qty 1

## 2011-12-19 MED ORDER — WARFARIN SODIUM 7.5 MG PO TABS
7.5000 mg | ORAL_TABLET | Freq: Once | ORAL | Status: AC
  Administered 2011-12-19: 7.5 mg via ORAL
  Filled 2011-12-19: qty 1

## 2011-12-19 NOTE — Progress Notes (Signed)
Patient ID: [name] Hall, male   DOB: 1945-12-27, 66 y.o.   MRN: [id number]   [facility] Group Notes:  (Counselor/Nursing/MHT/Case Management/Adjunct)  12/19/2011 1:15 PM  Type of Therapy:  Group Therapy, Dance/Movement Therapy   Participation Level:  Active  Participation Quality:  Appropriate  Affect:  Appropriate  Cognitive:  Appropriate  Insight:  Good  Engagement in Group:  Good  Engagement in Therapy:  Good  Modes of Intervention:  Clarification, Problem-solving, Role-play, Socialization and Support  Summary of Progress/Problems:  Pt shared that he feels like his dog when he is angry because he just secludes himself. Group conversed about how to deal with negative self-talk and supports who are negative by using health, positive coping skills. Pt disclosed that his family is very supportive to him, but he worries about loosing their support if he continues to relapse. Pts practiced a full body, breathing technique that encourages them to inhale positive energy and to exhale negative energy. Pts agreed to use this technique during quiet time today.      Thomasena Edis, [employer]

## 2011-12-19 NOTE — H&P (Signed)
Psychiatric Admission Assessment Adult  Patient Identification:  [name] Hall Date of Evaluation:  12/19/2011 65yo MWM  History of Present Illness::  Relapsed on alcohol about 10 days ago. 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 Says he and wife have wondered if he doesn't have Seasonal Affective DO. Has been drinking 4 22 oz beers. Denies issues with DT's withdrawal seizures or blackouts. Has a sponsor had promised his wife he'd go inpatient if he drank one more day. No visible signs of withdrawal. Says he  Needs to get back to work- now that the sun is out he can work and so can his employees.     Past Psychiatric History: [facility] Fles[facility] to [facility] August 2009  Was here [facility] fall 2010 [facility] July 2011  Substance Abuse History:  Social History:    reports that he has been smoking Cigarettes.  He does not have any smokeless tobacco history on file. He reports that he drinks alcohol. He reports that he does not use illicit drugs.  Family Psych History: One uncle drank  Past Medical History:     Past Medical History  Diagnosis Date  . Diabetes mellitus   . Prostate ca   . DVT (deep venous thrombosis)   . PE (pulmonary embolism)   Diagnoses with Prostate Cancer July 2011 in August while being worked up for cancer treatment was found to have a DVT and Pulmonary embolism has been on Coumadin therapy since.       Past Surgical History  Procedure Date  . Total hip arthroplasty   R total hip 10/2000  L 07/2005  Allergies:  Allergies  Allergen Reactions  . Sulfonamide Derivatives     REACTION: itching    Current Medications:  Prior to Admission medications   Medication Sig Start Date End Date Taking? Authorizing Provider  desvenlafaxine (PRISTIQ) 100 MG 24 hr tablet Take 100 mg by mouth daily.    Historical Provider, MD  ibuprofen (ADVIL,MOTRIN) 200 MG tablet Take 400 mg by mouth every 6 (six) hours as needed.    Historical  Provider, MD  warfarin (COUMADIN) 7.5 MG tablet Take 7.5 mg by mouth daily.    Historical Provider, MD    Mental Status Examination/Evaluation: Objective:  Appearance: Fairly Groomed  Needs to shave   Psychomotor Activity:  Normal no hand tremor  Eye Contact::  Good  Speech:  Clear and Coherent  Volume:  Normal  Mood:better sinc esun came out   Affect:  Congruent  Thought Process:  Clear rational goal oriented -go back to work   Orientation:  Full  Thought Content:  Denies AVH delusions   Suicidal Thoughts:  No  Homicidal Thoughts:  No  Judgement:  Intact  Insight:  Good    DIAGNOSIS:    AXIS I Alcohol Abuse  AXIS II Deferred  AXIS III See medical history.  AXIS IV economic problems and other psychosocial or environmental problems  AXIS V 51-60 moderate symptoms     Treatment Plan Summary: Admit for medically supported alcohol detox through use of Low dose Librium protocol. Pharmacy will monitor Coumadin therapy. Assess need for Campral or Naltrexone. Provided handout from [email or web address] re Light therapy and list of products from [employer].  Agree with H&P from ED.

## 2011-12-19 NOTE — Progress Notes (Signed)
Pt has been out in milieu interacting with peers and attending groups.  Is insightful about SA issues and using coping skills to avoid future relapses.  Denies pain.  Rates depression at 2 and hopelessness at 8. Denies SI.  Reports good sleep,energy level, and ability to pay attention. No over s/s of withdrawal.  Cont POC and 15' checks for safety.

## 2011-12-19 NOTE — Progress Notes (Signed)
This newly admitted patient has been orientating to the unit appropriately. Pt is present for groups and appropriately throughout the unit. Pt is calm and cooperative. Pt is currently not experiencing any withdrawal symptoms, minus anxiety. Pt denies any SI at this time. Support and availability as needed has been extended to this pt. Pt safety remains with q38min checks.

## 2011-12-19 NOTE — Progress Notes (Signed)
ANTICOAGULATION CONSULT NOTE - Follow Up Consult  Pharmacy Consult for DVT Indication: DVT  Allergies  Allergen Reactions  . Sulfonamide Derivatives     REACTION: itching    Patient Measurements: Height: 6' (182.9 cm) Weight: 205 lb (92.987 kg) IBW/kg (Calculated) : 77.6   Vital Signs: Temp: 98.4 F (36.9 C) (01/19 0700) BP: 80/53 mmHg (01/19 0701) Pulse Rate: 83  (01/19 0701)  Labs:  Basename 12/19/11 0741 12/17/11 1922 12/17/11 1719  HGB -- -- 14.9  HCT -- -- 42.9  PLT -- -- 168  APTT -- -- --  LABPROT 21.7* 32.1* --  INR 1.85* 3.06* --  HEPARINUNFRC -- -- --  CREATININE -- -- 0.96  CKTOTAL -- -- --  CKMB -- -- --  TROPONINI -- -- --   Estimated Creatinine Clearance: 84.2 ml/min (by C-G formula based on Cr of 0.96).   Medications:  Scheduled:    . chlordiazePOXIDE  25 mg Oral QID   Followed by  . chlordiazePOXIDE  25 mg Oral TID   Followed by  . chlordiazePOXIDE  25 mg Oral BH-qamhs   Followed by  . chlordiazePOXIDE  25 mg Oral Daily  . chlordiazePOXIDE  50 mg Oral Once  . desvenlafaxine  100 mg Oral Daily  . hydrocortisone valerate cream   Topical BID  . metFORMIN  500 mg Oral Q breakfast  . mulitivitamin with minerals  1 tablet Oral Daily  . rosuvastatin  40 mg Oral q1800  . thiamine  100 mg Intramuscular Once  . thiamine  100 mg Oral Daily  . traZODone  50 mg Oral QHS  . warfarin  7.5 mg Oral ONCE-1800  . warfarin  7.5 mg Oral ONCE-1800  . DISCONTD: desvenlafaxine  50 mg Oral Daily  . DISCONTD: venlafaxine  75 mg Oral Q breakfast  . DISCONTD: warfarin  7.5 mg Oral Daily    Assessment: Patients INR dropped to 1.85 probably due to missed dose of Coumadin on 1/17 Goal of Therapy:  INR 2-3   Plan:  Will give another coumdin 7.5 mg today and recheck INR in AM  Pamala Duffel L 12/19/2011,8:47 AM

## 2011-12-20 DIAGNOSIS — F1010 Alcohol abuse, uncomplicated: Secondary | ICD-10-CM

## 2011-12-20 LAB — PROTIME-INR
INR: 1.85 — ABNORMAL HIGH (ref 0.00–1.49)
Prothrombin Time: 21.7 seconds — ABNORMAL HIGH (ref 11.6–15.2)

## 2011-12-20 MED ORDER — WARFARIN SODIUM 7.5 MG PO TABS
7.5000 mg | ORAL_TABLET | Freq: Once | ORAL | Status: AC
  Administered 2011-12-20: 7.5 mg via ORAL
  Filled 2011-12-20: qty 1

## 2011-12-20 NOTE — Progress Notes (Signed)
Patient ID: [name] Hall, male   DOB: [birth year], 66 y.o.   MRN: [id number] Has been up and about this evening, attended group, denies having any withdrawal sx.  Seems to minimize withdrawal, sarcastic at times.  Has been cooperative, came to med window,  Denies SI/HI.  Will continue to monitor.

## 2011-12-20 NOTE — Progress Notes (Signed)
[facility] Group Notes:  (Counselor/Nursing/MHT/Case Management/Adjunct)  12/20/2011 1315PM  Type of Therapy:  Group Therapy  Participation Level:  Active  Participation Quality:  Appropriate  Affect:  Appropriate  Cognitive:  Appropriate  Insight:  Good  Engagement in Group:  Good  Engagement in Therapy:  Good  Modes of Intervention:  Activity, Clarification, Problem-solving and Support  Summary of Progress/Problems: Pt. participated in group session on supports. Pt. was asked what support means to them, who are their supports, what is the difference between unhealthy and healthy supports and what they can do when their support is not there. Pt. Stated that his family is support for him because they are loving, and understanding.   ,   12/20/2011; 3:02PM

## 2011-12-20 NOTE — Progress Notes (Signed)
Pt up and out in milieu interacting with peers and attending groups with active participation. Denies SI. Also denies S/S of withdrawal.  Rates depression at2 and hopelessness at 9.  Cont with previous goals of AA meeting attendance and step work at d/c. Compliant with scheduled medications and no prn's requested. Reports good sleep and appetite.  Ongoing support and encouragement and 15' checks cont for safety.

## 2011-12-20 NOTE — Progress Notes (Signed)
ANTICOAGULATION CONSULT NOTE - Follow Up Consult  Pharmacy Consult for Coumadin  Indication: DVT  Allergies  Allergen Reactions  . Sulfonamide Derivatives     REACTION: itching    Patient Measurements: Height: 6' (182.9 cm) Weight: 205 lb (92.987 kg) IBW/kg (Calculated) : 77.6    Vital Signs: Temp: 96.6 F (35.9 C) (01/20 0740) BP: 91/57 mmHg (01/20 0743) Pulse Rate: 75  (01/20 0743)  Labs:  Basename 12/20/11 0710 12/19/11 0741 12/17/11 1922 12/17/11 1719  HGB -- -- -- 14.9  HCT -- -- -- 42.9  PLT -- -- -- 168  APTT -- -- -- --  LABPROT 21.7* 21.7* 32.1* --  INR 1.85* 1.85* 3.06* --  HEPARINUNFRC -- -- -- --  CREATININE -- -- -- 0.96  CKTOTAL -- -- -- --  CKMB -- -- -- --  TROPONINI -- -- -- --   Estimated Creatinine Clearance: 84.2 ml/min (by C-G formula based on Cr of 0.96).   Medications:  Scheduled:    . chlordiazePOXIDE  25 mg Oral TID   Followed by  . chlordiazePOXIDE  25 mg Oral BH-qamhs   Followed by  . chlordiazePOXIDE  25 mg Oral Daily  . desvenlafaxine  100 mg Oral Daily  . hydrocortisone valerate cream   Topical BID  . metFORMIN  500 mg Oral Q breakfast  . mulitivitamin with minerals  1 tablet Oral Daily  . rosuvastatin  40 mg Oral q1800  . thiamine  100 mg Intramuscular Once  . thiamine  100 mg Oral Daily  . traZODone  50 mg Oral QHS  . warfarin  7.5 mg Oral ONCE-1800  . warfarin  7.5 mg Oral ONCE-1800  . DISCONTD: desvenlafaxine  50 mg Oral Daily  . DISCONTD: venlafaxine  75 mg Oral Q breakfast    Assessment: Patient INR remained at 1.85    Should be above 2 tomorrow with another 7.5 mg dose today  Goal of Therapy:  INR 2-3   Plan:  Will give home dose of 7.5 mg and recheck INR in AM  Pamala Duffel L 12/20/2011,8:24 AM

## 2011-12-20 NOTE — Progress Notes (Signed)
[facility] MD Progress Note  12/20/2011 3:08 PM  Diagnosis:  Axis I: Alcohol Abuse - Episodic.  The patient was seen today and reports the following:   ADL's: Intact  Sleep: The patient reports to sleeping reasonably well last night.  Appetite: The patient reports a good appetite.   Mild>(1-10) >Severe  Hopelessness (1-10): 0  Depression (1-10): 0  Anxiety (1-10): 2-3   Suicidal Ideation: The patient adamantly denies any suicidal ideations today.  Plan: No  Intent: No  Means: No   Homicidal Ideation: The patient adamantly denies any homicidal ideations today.  Plan: No  Intent: No.  Means: No   General Appearance /Behavior: Casual and appropriate.  Eye Contact: Good.  Speech: Appropriate in rate and volume with no pressuring noted.  Motor Behavior: Appropriate today.  Level of Consciousness: Alert and Oriented x 3.  Mental Status: AO x 3.  Mood: Euthymic.  Affect: Bright and Full.  Anxiety Level: Mild anxiety reported.  The patient states that he needs to be discharged as soon as possible in order to get back to work.  Thought Process: wnl  Thought Content: The patient denies any auditory or visual hallucinations today as well as any delusional thinking.  Perception:. wnl.  Judgment: Good.  Insight: Good.  Cognition: Orientated to time, place and person.  Sleep:  Number of Hours: 6.5   Vital Signs:Blood pressure 96/64, pulse 74, temperature 96.8 F (36 C), temperature source Oral, resp. rate 16, height 6' (1.829 m), weight 92.987 kg (205 lb), SpO2 96.00%.  Lab Results:  Results for orders placed during the hospital encounter of 12/18/11 (from the past 48 hour(s))  PROTIME-INR     Status: Abnormal   Collection Time   12/19/11  7:41 AM      Component Value Range Comment   Prothrombin Time 21.7 (*) 11.6 - 15.2 (seconds)    INR 1.85 (*) 0.00 - 1.49    PROTIME-INR     Status: Abnormal   Collection Time   12/20/11  7:10 AM      Component Value Range Comment   Prothrombin  Time 21.7 (*) 11.6 - 15.2 (seconds)    INR 1.85 (*) 0.00 - 1.49     The patient states that overall he is "doing well."  He denies any symptoms of alcohol withdrawal or any complaints today.  He plans to discuss with this Psychiatrist tomorrow the possibility of discharge.  Treatment Plan Summary:  1. Daily contact with patient to assess and evaluate symptoms and progress in treatment  2. Medication management  3. The patient will deny suicidal ideations or homicidal ideations for 48 hours prior to discharge and have a depression and anxiety rating of 3 or less. The patient will also deny any auditory or visual hallucinations or delusional thinking.  4. The patient will not report any symptoms of substance withdrawal at the time of discharge.  Plan:  1. Will continue the patient on his current medications. 2. Will continue to monitor.  3. TSH, Free T3, Free T4 and CMP ordered for this evening to further evaluate the patent's metabolic status.    12/20/2011, 3:08 PM

## 2011-12-20 NOTE — H&P (Signed)
Psychiatric Admission Assessment Adult  Patient Identification:  [name] Hall Date of Evaluation:  12/20/2011 Chief Complaint:  Alcohol Dependency History of Present Illness:: Pt. States he relapsed on alcohol in January of this year.  He had been sober since July of 2012.  He had been treated at [facility] and previously at [facility].  He states he had been drinking 4 22 oz beers a day.  Beer is his drink of choice.  He denies illicit drug abuse or "hard liquor." Mood Symptoms:  none Depression Symptoms: none  (Hypo) Manic Symptoms: none Anxiety Symptoms: none Psychotic Symptoms: none PTSD Symptoms:  none Abuse/Neglect/Assault/trauma: [place]. Exposed to agent orange   Past Psychiatric History: Diagnosis:  Alcohol dependency  Hospitalizations:  [place], [facility], [facility]  [facility]  Substance Abuse Care:  Self-Mutilation:  Suicidal Attempts: none  Violent Behaviors: none   Primary Care Provider: none  Past Medical History:   Past Medical History  Diagnosis Date  . Diabetes mellitus   . Prostate ca   . DVT (deep venous thrombosis)   . PE (pulmonary embolism) Bilateral hip replacements    Traumatic Brain Injury: 0 History of Loss of Consciousness:0   Surgical History: Bilateral hip replacements Seizure History:  0 Cardiac History:  0  Allergies:   Allergies  Allergen Reactions  . Sulfonamide Derivatives     REACTION: itching   Current Medications:  Current Facility-Administered Medications  Medication Dose Route Frequency Provider Last Rate Last Dose  . acetaminophen (TYLENOL) tablet 650 mg  650 mg Oral Q6H PRN Verne Spurr, PA      . alum & mag hydroxide-simeth (MAALOX/MYLANTA) 200-200-20 MG/5ML suspension 30 mL  30 mL Oral Q4H PRN Verne Spurr, PA      . chlordiazePOXIDE (LIBRIUM) capsule 25 mg  25 mg Oral Q6H PRN Verne Spurr, PA      . chlordiazePOXIDE (LIBRIUM) capsule 25 mg  25 mg Oral BH-qamhs Alyson Kuroski-Mazzei, DO   25  mg at 12/20/11 0825   Followed by  . chlordiazePOXIDE (LIBRIUM) capsule 25 mg  25 mg Oral Daily Alyson Kuroski-Mazzei, DO      . desvenlafaxine (PRISTIQ) 24 hr tablet 100 mg  100 mg Oral Daily Franchot Gallo, MD   100 mg at 12/20/11 0825  . hydrocortisone valerate cream (WESTCORT) 0.2 %   Topical BID Verne Spurr, PA      . hydrOXYzine (ATARAX/VISTARIL) tablet 25 mg  25 mg Oral Q6H PRN Verne Spurr, PA      . ibuprofen (ADVIL,MOTRIN) tablet 400 mg  400 mg Oral Q6H PRN Verne Spurr, PA      . loperamide (IMODIUM) capsule 2-4 mg  2-4 mg Oral PRN Verne Spurr, PA      . magnesium hydroxide (MILK OF MAGNESIA) suspension 30 mL  30 mL Oral Daily PRN Verne Spurr, PA      . metFORMIN (GLUCOPHAGE) tablet 500 mg  500 mg Oral Q breakfast Verne Spurr, PA   500 mg at 12/20/11 0825  . mulitivitamin with minerals tablet 1 tablet  1 tablet Oral Daily Verne Spurr, [place]   1 tablet at 12/20/11 0824  . ondansetron (ZOFRAN-ODT) disintegrating tablet 4 mg  4 mg Oral Q6H PRN Verne Spurr, PA      . rosuvastatin (CRESTOR) tablet 40 mg  40 mg Oral q1800 Verne Spurr, PA   40 mg at 12/20/11 1722  . thiamine (B-1) injection 100 mg  100 mg Intramuscular Once Verne Spurr, [place]      .  thiamine (VITAMIN B-1) tablet 100 mg  100 mg Oral Daily Verne Spurr, PA   100 mg at 12/20/11 0824  . traZODone (DESYREL) tablet 50 mg  50 mg Oral QHS Alyson Kuroski-Mazzei, DO   50 mg at 12/19/11 2146  . warfarin (COUMADIN) tablet 7.5 mg  7.5 mg Oral ONCE-1800 Malva Cogan, RPH   7.5 mg at 12/20/11 1722    Previous Psychotropic Medications: Medication Dose                       Substance Abuse History Denies Substance Age of 1st Use Last Use Amount Specific Type  Nicotine      Alcohol      Cannabis      Opiates      Cocaine      Methamphetamines      LSD      Ecstasy      Benzodiazepines      Caffeine      Inhalants      Others:                        Hx. Of DT: shakes Hx. Of Seizures with withdrawal:  no Hx. Of black outs: x 1 with Ambien, Xanax and beer  Legal Charges: none Time served: Court date: Dept. Social Services involvement:  DT's:  Yes Withdrawal Symptoms:  Tremors  Social History: Current Place of Residence: [place]  Place of Birth:   Family Members: Wife  Marital Status:  X 43 years Children:  3 grown sons Education:   Religious Beliefs/Practices: Employment: Hotel manager History:   Family History:  No family history on file.  ROS: Negative PE: Done in ED, and I agree with those findings.  LABS:ETOH: 135 Mental Status Examination/Evaluation Objective:  Appearance: Well Groomed  Eye Contact::  Good  Speech:  Clear and Coherent  Volume:  Normal  Mood:   Euthymic  Affect:  Congruent  Thought Process:  Linear  Orientation:  Full  Thought Content:  Hallucinations: None  Suicidal Thoughts:  No  Homicidal Thoughts:  No  Judgement:  Intact  Insight:  Good  Psychomotor Activity:  Normal  Akathisia:  No  Handed:  Right  AIMS (if indicated):     Assets:  Communication Skills Desire for Improvement    AXIS I Alcohol dependency recent relapse  AXIS II Deferred  AXIS III Past Medical History  Diagnosis Date  . Diabetes mellitus   . Prostate ca   . DVT (deep venous thrombosis)   . PE (pulmonary embolism)      AXIS IV economic problems  AXIS V 51-60 moderate symptoms   Recommendations:  Treatment Plan Summary: Daily contact with patient to assess and evaluate symptoms and progress in treatment Medication management  Observation Level/Precautions:  Laboratory:    Psychotherapy:    Medications:    Routine PRN Medications:  Yes  Consultations:    Discharge Concerns:    Other:      Lloyd Huger T.  PAC For Dr. Gurney Maxin 12/19/2011 8:49 PM

## 2011-12-21 LAB — COMPREHENSIVE METABOLIC PANEL WITH GFR
ALT: 18 U/L (ref 0–53)
AST: 16 U/L (ref 0–37)
Albumin: 3.4 g/dL — ABNORMAL LOW (ref 3.5–5.2)
Alkaline Phosphatase: 117 U/L (ref 39–117)
BUN: 15 mg/dL (ref 6–23)
CO2: 30 meq/L (ref 19–32)
Calcium: 9.5 mg/dL (ref 8.4–10.5)
Chloride: 100 meq/L (ref 96–112)
Creatinine, Ser: 1.01 mg/dL (ref 0.50–1.35)
GFR calc Af Amer: 88 mL/min — ABNORMAL LOW
GFR calc non Af Amer: 76 mL/min — ABNORMAL LOW
Glucose, Bld: 120 mg/dL — ABNORMAL HIGH (ref 70–99)
Potassium: 4.2 meq/L (ref 3.5–5.1)
Sodium: 137 meq/L (ref 135–145)
Total Bilirubin: 0.3 mg/dL (ref 0.3–1.2)
Total Protein: 6.8 g/dL (ref 6.0–8.3)

## 2011-12-21 LAB — T3, FREE: T3, Free: 2.7 pg/mL (ref 2.3–4.2)

## 2011-12-21 LAB — T4, FREE: Free T4: 1.1 ng/dL (ref 0.80–1.80)

## 2011-12-21 MED ORDER — DESVENLAFAXINE SUCCINATE ER 100 MG PO TB24: 100.0000 mg | ORAL_TABLET | Freq: Every day | ORAL | Status: DC

## 2011-12-21 MED ORDER — WARFARIN SODIUM 10 MG PO TABS: 10.0000 mg | ORAL_TABLET | Freq: Once | ORAL | Status: DC

## 2011-12-21 MED ORDER — WARFARIN SODIUM 10 MG PO TABS
10.0000 mg | ORAL_TABLET | Freq: Once | ORAL | Status: DC
  Filled 2011-12-21: qty 1

## 2011-12-21 NOTE — Progress Notes (Signed)
Pt attended discharge planning group and actively participated.  Pt presents with calm mood and affect.  Pt was open with sharing reason for entering the hospital.  Pt states he is an alcoholic and has been drinking for 6 years.  Pt states that he came here to detox from alcohol.  Pt states that he began to drink during the day which became a problem at work.  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  Pt states he lives in [place].  Pt has access to transportation.  Pt states he goes to [employer] and has a sponsor.  Pt has follow up with Andee Poles and Gwen Her for medication management and therapy.  SW will schedule follow up there.  Pt denies depression, anxiety and SI.  Safety planning and suicide prevention discussed.     [name] Hall, LCSWA 12/21/2011  10:03 AM

## 2011-12-21 NOTE — Progress Notes (Signed)
[facility] Case Management Discharge Plan:  Will you be returning to the same living situation after discharge: Yes,  pt to return home At discharge, do you have transportation home?:Yes,  pt has transportation Do you have the ability to pay for your medications:Yes,  access to meds  Interagency Information:   Release of information consent forms completed and in the chart; pt's signature needed at discharge.   Release of information consent forms completed and in the chart;  Patient's signature needed at discharge.  Patient to Follow up at:  Follow-up Information    Follow up with Andee Poles on 01/12/2012. (Appointment scheduled at 1:45 pm)    Contact information:   [address] [place], [place] [address] [phone]      Follow up with AA meetings.   Contact information:   see brochure for local meetings and times      Follow up with Doristine Locks, LPC on 12/23/2011. (Appointment scheduled at 11:00 am)    Contact information:   [address], [address] [place], [place] [address] [phone]         Patient denies SI/HI:   Yes,  pt denies today    Safety Planning and Suicide Prevention discussed:  Yes,  discussed with pt  Barrier to discharge identified:No.  Summary and Recommendations: No recommendations from SW.  No further needs voiced by pt.  Pt stable to discharge.     Carmina Miller 12/21/2011, 12:07 PM

## 2011-12-21 NOTE — Progress Notes (Signed)
Patient ID: [name] Hall, male   DOB: October 12, 1946, 66 y.o.   MRN: [id number] Pt denied SI/HI/AVH on discharge.  Discharge instructions with crisis numbers given and explained to [facility].  He did not have any belongings in a locker (everything of his was with him).  Rx and discharge instructions given to the pt and escorted to the lobby to his wife for his ride home.

## 2011-12-21 NOTE — ED Provider Notes (Signed)
Medical screening examination/treatment/procedure(s) were performed by non-physician practitioner and as supervising physician I was immediately available for consultation/collaboration.   R. , MD 12/21/11 1452 

## 2011-12-21 NOTE — Progress Notes (Signed)
Patient ID: [name] Hall, male   DOB: 19-Dec-1945, 66 y.o.   MRN: [id number] Pt was scheduled for thyroid panel, cmet tonight, was irritable that he would be drawn again in am for pt/inr.  All labs were rescheduled for am so that he wouldn't be stuck twice, voiced appreciation but remained irritable.  Denies any withdrawal sx tonight, initially refused librium ordered for tonight, but took it to feel he had completed detox .   Attended group[. Will continue to monitor.

## 2011-12-21 NOTE — Progress Notes (Signed)
ANTICOAGULATION CONSULT NOTE - Follow Up Consult  Pharmacy Consult for coumadin Indication: DVT  Allergies  Allergen Reactions  . Sulfonamide Derivatives     REACTION: itching    Patient Measurements: Height: 6' (182.9 cm) Weight: 205 lb (92.987 kg) IBW/kg (Calculated) : 77.6    Vital Signs: Temp: 95.8 F (35.4 C) (01/21 0759) Temp src: Oral (01/21 0759) BP: 94/64 mmHg (01/21 0759) Pulse Rate: 84  (01/21 0759)  Labs:  [name] Hall 12/21/11 0621 12/20/11 0710 12/19/11 0741  HGB -- -- --  HCT -- -- --  PLT -- -- --  APTT -- -- --  LABPROT 20.0* 21.7* 21.7*  INR 1.67* 1.85* 1.85*  HEPARINUNFRC -- -- --  CREATININE 1.01 -- --  CKTOTAL -- -- --  CKMB -- -- --  TROPONINI -- -- --   Estimated Creatinine Clearance: 80 ml/min (by C-G formula based on Cr of 1.01).   Medications:  Scheduled:    . chlordiazePOXIDE  25 mg Oral BH-qamhs   Followed by  . chlordiazePOXIDE  25 mg Oral Daily  . desvenlafaxine  100 mg Oral Daily  . hydrocortisone valerate cream   Topical BID  . metFORMIN  500 mg Oral Q breakfast  . mulitivitamin with minerals  1 tablet Oral Daily  . rosuvastatin  40 mg Oral q1800  . thiamine  100 mg Intramuscular Once  . thiamine  100 mg Oral Daily  . traZODone  50 mg Oral QHS  . warfarin  10 mg Oral ONCE-1800  . warfarin  7.5 mg Oral ONCE-1800    Assessment: Inr below goal.  Decreased INR probally due to missed dose during initial hospitalization.  No problems noted with therapy.   Goal of Therapy:  INR 2-3   Plan: Coumadin 10 mg x 1 today, PT/INr in am 12/22/11   Charyl Dancer 12/21/2011,12:55 PM

## 2011-12-21 NOTE — [facility] Suicide Risk Assessment (Signed)
Suicide Risk Assessment  Discharge Assessment      Demographic factors:    Current Mental Status: Patient seen and evaluated in treatment team. Chart reviewed. Patient stated that his mood was "good". His affect was mood congruent and euthymic. He denied any current thoughts of self injurious behavior, suicidal ideation or homicidal ideation. He denied any significant depressive signs or symptoms at this time. There were no auditory or visual hallucinations, paranoia, delusional thought processes, or mania noted.  Thought process was linear and goal directed.  No psychomotor agitation or retardation was noted. His speech was normal rate, tone and volume. Eye contact was good. Judgment and insight are improved.  Patient has been up and engaged on the unit.  No acute safety concerns reported from team.   Loss Factors:  Loss Factors: Decline in physical health;Financial problems / change in socioeconomic status  Historical Factors:  Historical Factors: Family history of mental illness or substance abuse;Impulsivity  Risk Reduction Factors:  Risk Reduction Factors: Sense of responsibility to family;Religious beliefs about death;Employed;Living with another person, especially a relative;Positive social support;Positive therapeutic relationship; connected with AA, has a sponsor; completed residential Tx in past with 38m sobriety; has outpt providers  CLINICAL FACTORS: Alcohol Dependence   COGNITIVE FEATURES THAT CONTRIBUTE TO RISK: none.  SUICIDE RISK: Patient is currently viewed as a low risk of harm to himself and others in light of his history and risk factors. There are no acute safety concerns and he is stable for discharge.    PLAN OF CARE: Pt stable for and requesting discharge. Pt contracting for safety and does not currently meet [place] involuntary commitment criteria for continued hospitalization.  Mental health treatment, medication management and continued sobriety will mitigate against the  increased risk of harm to self and/or others.  Discussed the importance of recovery further with pt, as well as, tools to move forward in a healthy & safe manner.  Pt agreeable with the plan.  Discussed with the team.  Please see orders, follow up plans per team and full discharge summary completed by physician extender.  No w/d s/s noted.  Pt stable with d/c home and f/u with AA.  Lupe Carney 12/21/2011, 2:47 PM

## 2011-12-21 NOTE — Tx Team (Signed)
Interdisciplinary Treatment Plan Update (Adult)  Date:  12/21/2011  Time Reviewed:  10:09 AM   Progress in Treatment: Attending groups: Yes Participating in groups:  Yes Taking medication as prescribed: Yes Tolerating medication:  Yes Family/Significant othe contact made:  Counselor assessing for appropriate contact Patient understands diagnosis:  Yes Discussing patient identified problems/goals with staff:  Yes Medical problems stabilized or resolved:  Yes Denies suicidal/homicidal ideation: Yes Issues/concerns per patient self-inventory:  None identified Other: N/A  New problem(s) identified: None Identified  Reason for Continuation of Hospitalization: Stable to d/c  Interventions implemented related to continuation of hospitalization: Stable to d/c  Additional comments: N/A  Estimated length of stay: D/C today  Discharge Plan: Pt will follow up with Andee Poles and Doristine Locks for medication management and therapy, AA meetings and sponsor  New goal(s): N/A  Review of initial/current patient goals per problem list:    1.  Goal(s): Address substance use  Met:  Yes  Target date: by discharge  As evidenced by: completed detox protocol and referred to appropriate treatment  2.  Goal (s): Eliminate SI  Met:  Yes  Target date: by discharge  As evidenced by: Pt denies SI.    3.  Goal(s):  Met:  No  Target date: by discharge  As evidenced by:   Attendees: Patient:  [name] Hall 12/21/2011 10:14 AM   Family:     Physician:  Lupe Carney, DO 12/21/2011 10:09 AM   Nursing:  12/21/2011 10:09 AM   Case Manager:  Reyes Ivan, LCSWA 12/21/2011  10:09 AM   Counselor:  Ronda Fairly, LCSWA 12/21/2011  10:09 AM   Other:  Richelle Ito, LCSW 12/21/2011 10:09 AM   Other:  Verne Spurr, PA 12/21/2011 10:09 AM   Other:     Other:      Scribe for Treatment Team:   Reyes Ivan 12/21/2011 10:09 AM

## 2011-12-21 NOTE — Discharge Summary (Signed)
[name] Hall Jun 12, 1946 66 y.o.  [id number]     Date of Admission: 12/19/2011 Date of Discharge:  12/21/2011 Diagnosis: AXIS I Alcohol dependency  AXIS II Deferred  AXIS III Past Medical History  Diagnosis Date  .       AXIS IV economic problems  AXIS V 51-60 moderate symptoms    HPI: Pt. States he relapsed on alcohol in January of this year. He had been sober since July of 2012. He had been treated at [facility] and previously at [facility]. He states he had been drinking 4 22 oz beers a day. Beer is his drink of choice   Hospital Course:      The duration of [name] Hall"s stay at [facility] was unremarkable.      The patient was seen and evaluated by the Treatment team consisting of Psychiatrist, PAC, RN, Case Manager, and Therapist for evaluation and treatment plan with goal of stabilization upon discharge. The patient's physical and mental health problems were identified and treated appropriately.      Multiple modalities of treatment were used including medication, individual and group therapies, unit programming, AA/NA, improved nutrition, physical activity, and family sessions as needed.     The symptoms of alcohol/substance abuse withdrawal were monitored daily by serial clinical withdrawal scores. Improvement was demonstrated by declining CIWA/COWS numbers, improving vital signs, increased cognition, and improvement in mood, sleep, appetite as well as a reduction in psychosocial symptoms.       The patient was evaluated and found to be stable enough for discharge and was released to his home per the initial plan of treatment.   Mental Status Exam:  For mental status exam please see mental status exam and  suicide risk assessment completed by attending physician prior to discharge. BP 94/64  Pulse 84  Temp(Src) 95.8 F (35.4 C) (Oral)  Resp 24  Ht 6' (1.829 m)  Wt 205 lb (92.987 kg)  BMI 27.80 kg/m2  SpO2 96% Labs: PT/INR low Level of Care:  OP  Meds on Discharge: Current Discharge  Medication List    CONTINUE these medications which have NOT CHANGED   Details  desvenlafaxine (PRISTIQ) 100 MG 24 hr tablet Take 100 mg by mouth daily.    ibuprofen (ADVIL,MOTRIN) 200 MG tablet Take 400 mg by mouth every 6 (six) hours as needed.    warfarin (COUMADIN) 7.5 MG tablet Take 7.5 mg by mouth daily.       Is patient on multiple antipsychotic therapies at discharge:  No   Has Patient had three or more failed trials of antipsychotic monotherapy by history:  No  Follow-up recommendations:  Diet:  Carbohydrate modified Tests:  PT/INR tomorrow with your PCP Other:  follow up with your PCP and psyhiatrist as planned. Discharge destination:  Home Comments: Please keep all follow up appointments as scheduled. Rona Ravens.  PAC for DR.Alyson Kuroski-MazzieMD 12/21/2011

## 2011-12-21 NOTE — Progress Notes (Signed)
[facility] Group Notes:  (Counselor/Nursing/MHT/Case Management/Adjunct)  12/21/2011 4:52 PM   Type of Therapy:  Processing Group at 11:00 am  Participation Level:  Active  Participation Quality:  Appropriate, attentive and sharing  Affect:  Appropriate  Cognitive:  Appropriate  Insight:  Limited  Engagement in Group:  Good  Engagement in Therapy:  Unknown  Modes of Intervention:  Exploration, clarification and support.  Summary of Progress/Problems:  Terance was attentive during group and sharing in manner that appeared to represent familiarity with 12 Step program of AA. When topic of HALT came up Ege was asked to describe to another patient and his description suggested lack of knowledge as "hungry angry, lonely, and tired" was described by patient as "hunger for your drug, people in your life angry with you, lonely, as you want to be alone with your drug and tired of trying to stay dry"  [facility] Group Notes:  (Counselor/Nursing/MHT/Case Management/Adjunct)  12/21/2011 4:59 PM   Type of Therapy:  Counseling Group at 1:15 pm  Participation Level:  Did Not Attend   Ronda Fairly, LCSWA 12/21/2011 4:52 PM

## 2011-12-22 NOTE — Progress Notes (Signed)
Patient Discharge Instructions:  Admission Note Faxed,  12/22/2011 After Visit Summary Faxed,  12/22/2011 Faxed to the Next Level Care provider:  12/22/2011 D/C Summary faxed 12/22/2011 [facility] faxed 12/22/2011   Faxed to [facility] @ [phone] And to [facility] @ [phone]  Heloise Purpura Eduard Clos, 12/22/2011, 5:57 PM

## 2012-04-13 ENCOUNTER — Emergency Department (HOSPITAL_COMMUNITY): Payer: Medicare Other

## 2012-04-13 ENCOUNTER — Encounter (HOSPITAL_COMMUNITY): Payer: Self-pay | Admitting: Emergency Medicine

## 2012-04-13 ENCOUNTER — Observation Stay (HOSPITAL_COMMUNITY)
Admission: EM | Admit: 2012-04-13 | Discharge: 2012-04-15 | Disposition: A | Discharge status: Home / Self Care | Payer: Medicare Other | Attending: Internal Medicine | Admitting: Internal Medicine

## 2012-04-13 DIAGNOSIS — Z79899 Other long term (current) drug therapy: Secondary | ICD-10-CM | POA: Insufficient documentation

## 2012-04-13 DIAGNOSIS — E785 Hyperlipidemia, unspecified: Secondary | ICD-10-CM

## 2012-04-13 DIAGNOSIS — R9431 Abnormal electrocardiogram [ECG] [EKG]: Secondary | ICD-10-CM | POA: Insufficient documentation

## 2012-04-13 DIAGNOSIS — E860 Dehydration: Secondary | ICD-10-CM | POA: Diagnosis present

## 2012-04-13 DIAGNOSIS — M25551 Pain in right hip: Secondary | ICD-10-CM | POA: Diagnosis present

## 2012-04-13 DIAGNOSIS — F1010 Alcohol abuse, uncomplicated: Secondary | ICD-10-CM

## 2012-04-13 DIAGNOSIS — M25559 Pain in unspecified hip: Secondary | ICD-10-CM | POA: Insufficient documentation

## 2012-04-13 DIAGNOSIS — F329 Major depressive disorder, single episode, unspecified: Secondary | ICD-10-CM | POA: Insufficient documentation

## 2012-04-13 DIAGNOSIS — R55 Syncope and collapse: Secondary | ICD-10-CM | POA: Diagnosis present

## 2012-04-13 DIAGNOSIS — C61 Malignant neoplasm of prostate: Secondary | ICD-10-CM

## 2012-04-13 DIAGNOSIS — Y92009 Unspecified place in unspecified non-institutional (private) residence as the place of occurrence of the external cause: Secondary | ICD-10-CM | POA: Insufficient documentation

## 2012-04-13 DIAGNOSIS — R42 Dizziness and giddiness: Secondary | ICD-10-CM | POA: Insufficient documentation

## 2012-04-13 DIAGNOSIS — M533 Sacrococcygeal disorders, not elsewhere classified: Secondary | ICD-10-CM | POA: Insufficient documentation

## 2012-04-13 DIAGNOSIS — Z8673 Personal history of transient ischemic attack (TIA), and cerebral infarction without residual deficits: Secondary | ICD-10-CM | POA: Insufficient documentation

## 2012-04-13 DIAGNOSIS — W19XXXA Unspecified fall, initial encounter: Secondary | ICD-10-CM

## 2012-04-13 DIAGNOSIS — Z87891 Personal history of nicotine dependence: Secondary | ICD-10-CM

## 2012-04-13 DIAGNOSIS — Z86718 Personal history of other venous thrombosis and embolism: Secondary | ICD-10-CM

## 2012-04-13 DIAGNOSIS — Z7901 Long term (current) use of anticoagulants: Secondary | ICD-10-CM | POA: Insufficient documentation

## 2012-04-13 DIAGNOSIS — F3289 Other specified depressive episodes: Secondary | ICD-10-CM | POA: Insufficient documentation

## 2012-04-13 DIAGNOSIS — I951 Orthostatic hypotension: Principal | ICD-10-CM | POA: Diagnosis present

## 2012-04-13 DIAGNOSIS — F411 Generalized anxiety disorder: Secondary | ICD-10-CM | POA: Insufficient documentation

## 2012-04-13 DIAGNOSIS — E119 Type 2 diabetes mellitus without complications: Secondary | ICD-10-CM | POA: Diagnosis present

## 2012-04-13 DIAGNOSIS — Z96649 Presence of unspecified artificial hip joint: Secondary | ICD-10-CM | POA: Insufficient documentation

## 2012-04-13 DIAGNOSIS — M899 Disorder of bone, unspecified: Secondary | ICD-10-CM | POA: Insufficient documentation

## 2012-04-13 LAB — DIFFERENTIAL
Lymphocytes Relative: 11 % — ABNORMAL LOW (ref 12–46)
Lymphs Abs: 0.8 10*3/uL (ref 0.7–4.0)
Monocytes Relative: 9 % (ref 3–12)
Neutrophils Relative %: 79 % — ABNORMAL HIGH (ref 43–77)

## 2012-04-13 LAB — PROTIME-INR: INR: 2.64 — ABNORMAL HIGH (ref 0.00–1.49)

## 2012-04-13 LAB — URINALYSIS, ROUTINE W REFLEX MICROSCOPIC
Leukocytes, UA: NEGATIVE
Nitrite: NEGATIVE
Protein, ur: 30 mg/dL — AB
Specific Gravity, Urine: 1.026 (ref 1.005–1.030)
Urobilinogen, UA: 1 mg/dL (ref 0.0–1.0)

## 2012-04-13 LAB — CBC
Hemoglobin: 16.3 g/dL (ref 13.0–17.0)
MCH: 32.2 pg (ref 26.0–34.0)
MCV: 93.9 fL (ref 78.0–100.0)
Platelets: 153 10*3/uL (ref 150–400)
RBC: 5.06 MIL/uL (ref 4.22–5.81)
WBC: 7.4 10*3/uL (ref 4.0–10.5)

## 2012-04-13 LAB — GLUCOSE, CAPILLARY
Glucose-Capillary: 101 mg/dL — ABNORMAL HIGH (ref 70–99)
Glucose-Capillary: 99 mg/dL (ref 70–99)

## 2012-04-13 LAB — BASIC METABOLIC PANEL
BUN: 22 mg/dL (ref 6–23)
CO2: 25 mEq/L (ref 19–32)
Glucose, Bld: 112 mg/dL — ABNORMAL HIGH (ref 70–99)
Potassium: 4.1 mEq/L (ref 3.5–5.1)
Sodium: 134 mEq/L — ABNORMAL LOW (ref 135–145)

## 2012-04-13 LAB — ETHANOL: Alcohol, Ethyl (B): <11 mg/dL (ref 0–11)

## 2012-04-13 LAB — APTT: aPTT: 42 seconds — ABNORMAL HIGH (ref 24–37)

## 2012-04-13 LAB — URINE MICROSCOPIC-ADD ON

## 2012-04-13 LAB — CARDIAC PANEL(CRET KIN+CKTOT+MB+TROPI)
CK, MB: 2 ng/mL (ref 0.3–4.0)
Troponin I: <0.3 ng/mL (ref <0.30)

## 2012-04-13 MED ORDER — ONDANSETRON HCL 4 MG/2ML IJ SOLN
4.0000 mg | Freq: Once | INTRAMUSCULAR | Status: AC
  Administered 2012-04-13: 4 mg via INTRAVENOUS
  Filled 2012-04-13: qty 2

## 2012-04-13 MED ORDER — PNEUMOCOCCAL VAC POLYVALENT 25 MCG/0.5ML IJ INJ
0.5000 mL | INJECTION | INTRAMUSCULAR | Status: AC
  Filled 2012-04-13: qty 0.5

## 2012-04-13 MED ORDER — ATORVASTATIN CALCIUM 80 MG PO TABS
80.0000 mg | ORAL_TABLET | Freq: Every day | ORAL | Status: DC
  Administered 2012-04-13 – 2012-04-14 (×2): 80 mg via ORAL
  Filled 2012-04-13 (×3): qty 1

## 2012-04-13 MED ORDER — INSULIN ASPART 100 UNIT/ML ~~LOC~~ SOLN
0.0000 [IU] | Freq: Three times a day (TID) | SUBCUTANEOUS | Status: DC
Start: 2012-04-13 — End: 2012-04-15
  Administered 2012-04-14: 1 [IU] via SUBCUTANEOUS

## 2012-04-13 MED ORDER — MORPHINE SULFATE 2 MG/ML IJ SOLN: 2.0000 mg | INTRAMUSCULAR | Status: DC | PRN

## 2012-04-13 MED ORDER — ACETAMINOPHEN 650 MG RE SUPP: 650.0000 mg | Freq: Four times a day (QID) | RECTAL | Status: DC | PRN

## 2012-04-13 MED ORDER — ONDANSETRON HCL 4 MG/2ML IJ SOLN: 4.0000 mg | Freq: Three times a day (TID) | INTRAMUSCULAR | Status: DC | PRN

## 2012-04-13 MED ORDER — ONDANSETRON HCL 4 MG/2ML IJ SOLN: 4.0000 mg | Freq: Four times a day (QID) | INTRAMUSCULAR | Status: DC | PRN

## 2012-04-13 MED ORDER — SODIUM CHLORIDE 0.9 % IJ SOLN
3.0000 mL | Freq: Two times a day (BID) | INTRAMUSCULAR | Status: DC
  Administered 2012-04-13: 3 mL via INTRAVENOUS

## 2012-04-13 MED ORDER — HYDROCODONE-ACETAMINOPHEN 5-325 MG PO TABS
1.0000 | ORAL_TABLET | ORAL | Status: DC | PRN
  Administered 2012-04-13 – 2012-04-15 (×5): 2 via ORAL
  Filled 2012-04-13 (×5): qty 2

## 2012-04-13 MED ORDER — SODIUM CHLORIDE 0.9 % IV BOLUS (SEPSIS): 500.0000 mL | Freq: Once | INTRAVENOUS | Status: DC

## 2012-04-13 MED ORDER — WARFARIN SODIUM 7.5 MG PO TABS
7.5000 mg | ORAL_TABLET | Freq: Once | ORAL | Status: AC
  Filled 2012-04-13: qty 1

## 2012-04-13 MED ORDER — ALBUTEROL SULFATE (5 MG/ML) 0.5% IN NEBU: 2.5000 mg | INHALATION_SOLUTION | RESPIRATORY_TRACT | Status: DC | PRN

## 2012-04-13 MED ORDER — METFORMIN HCL 500 MG PO TABS
500.0000 mg | ORAL_TABLET | Freq: Every day | ORAL | Status: DC
  Administered 2012-04-14: 500 mg via ORAL
  Filled 2012-04-13 (×3): qty 1

## 2012-04-13 MED ORDER — ONDANSETRON HCL 4 MG PO TABS: 4.0000 mg | ORAL_TABLET | Freq: Four times a day (QID) | ORAL | Status: DC | PRN

## 2012-04-13 MED ORDER — FENTANYL CITRATE 0.05 MG/ML IJ SOLN
50.0000 ug | Freq: Once | INTRAMUSCULAR | Status: AC
  Administered 2012-04-13: 50 ug via INTRAVENOUS
  Filled 2012-04-13: qty 2

## 2012-04-13 MED ORDER — DESVENLAFAXINE SUCCINATE ER 100 MG PO TB24
100.0000 mg | ORAL_TABLET | Freq: Every day | ORAL | Status: DC
  Administered 2012-04-14 – 2012-04-15 (×2): 100 mg via ORAL
  Filled 2012-04-13 (×3): qty 1

## 2012-04-13 MED ORDER — SODIUM CHLORIDE 0.9 % IV SOLN
INTRAVENOUS | Status: DC
Start: 2012-04-13 — End: 2012-04-15
  Administered 2012-04-13: 17:00:00 via INTRAVENOUS
  Administered 2012-04-14: 125 mL/h via INTRAVENOUS
  Administered 2012-04-14 – 2012-04-15 (×2): via INTRAVENOUS

## 2012-04-13 MED ORDER — DOCUSATE SODIUM 100 MG PO CAPS
100.0000 mg | ORAL_CAPSULE | Freq: Two times a day (BID) | ORAL | Status: DC
  Administered 2012-04-13 – 2012-04-15 (×4): 100 mg via ORAL
  Filled 2012-04-13 (×5): qty 1

## 2012-04-13 MED ORDER — FENTANYL CITRATE 0.05 MG/ML IJ SOLN: 50.0000 ug | INTRAMUSCULAR | Status: DC | PRN

## 2012-04-13 MED ORDER — ACETAMINOPHEN 325 MG PO TABS: 650.0000 mg | ORAL_TABLET | Freq: Four times a day (QID) | ORAL | Status: DC | PRN

## 2012-04-13 MED ORDER — SODIUM CHLORIDE 0.9 % IV SOLN
INTRAVENOUS | Status: AC
  Administered 2012-04-13: 11:00:00 via INTRAVENOUS

## 2012-04-13 MED ORDER — SODIUM CHLORIDE 0.9 % IV BOLUS (SEPSIS): 1000.0000 mL | Freq: Once | INTRAVENOUS | Status: DC

## 2012-04-13 MED ORDER — WARFARIN - PHARMACIST DOSING INPATIENT: Freq: Every day | Status: DC

## 2012-04-13 NOTE — Progress Notes (Signed)
Assumed care of Patient about 1630. Pt Stable. PT VSS BP 113/69. Denies any light headness or dizziness. IVF hung 0.9 NS @ 125/hr as ordered. Pt wife at bedside. Will continue to monitor.

## 2012-04-13 NOTE — ED Provider Notes (Signed)
History     CSN: [id number]  Arrival date & time 04/13/12  1015   First MD Initiated Contact with Patient 04/13/12 1016      Chief Complaint  Patient presents with  . Fall  . 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 Patient states that he got up out of his recliner and then felt "like things were getting dark." Patient's wife is at bedside and states that she witnessed him standing up and then he fell to the ground. She states that he immediately was responsive upon hitting the ground. Patient denies any pain prior to the syncopal event. Patient states he did strike his head and he landed on his right hip causing pain. Wife states that they checked his BP immediately after fall and it was 61/50 so they assumed he fainted due to drop in BP and they helped him to bed and went to sleep. Patient states he's been unable to bear weight on his hip since the fall and because of ongoing severe pain and inability to walk this morning called EMS to come to the ER. Patient states he is status post bilateral hip replacements. Patient states he's seen Dr. Despina Hick for orthopedics in the past. His PCP is Dr. [name]. Patient is complaining of current right hip pain that is constant and aggravated by any movement or lying on that side. Patient denies headache, dizziness, nausea, vomiting, chest pain, shortness of breath, abdominal pain, upper extremity pain or injury, neck pain, or back pain. Patient was given 100 mcg of fentanyl by EMS in route. Patient denies lower extremity numbness or tingling. Patient has hx of alcohol abuse but denies current alcohol use. Patient smoke tobacco.  Past Medical History  Diagnosis Date  . Diabetes  mellitus   . Prostate ca   . DVT (deep venous thrombosis)   . PE (pulmonary embolism)     Past Surgical History  Procedure Date  . Total hip arthroplasty     No family history on file.  History  Substance Use Topics  . Smoking status: Current Everyday Smoker -- 1.0 packs/day    Types: Cigarettes  . Smokeless tobacco: Not on file  . Alcohol Use: No      Review of Systems  All other systems reviewed and are negative.    Allergies  Sulfonamide derivatives  Home Medications   Current Outpatient Rx  Name Route Sig Dispense Refill  . DESVENLAFAXINE SUCCINATE ER 100 MG PO TB24 Oral Take 1 tablet (100 mg total) by mouth daily. For anxiety and depression.    . WARFARIN SODIUM 10 MG PO TABS Oral Take 1 tablet (10 mg total) by mouth one time only at 6 PM. For anticoagulation. 1 tablet 0  . WARFARIN SODIUM 7.5 MG PO TABS Oral Take 7.5 mg by mouth daily.      BP 100/59  Pulse 66  Temp(Src) 97.8 F (36.6 C) (Oral)  Resp 20  Ht 6' (1.829 m)  Wt 205 lb (92.987 kg)  BMI 27.80 kg/m2  SpO2 97%  Physical Exam  Nursing note and vitals reviewed. Constitutional: He is oriented to person, place, and time. He appears well-developed and well-nourished. No distress.  HENT:  Head: Normocephalic and atraumatic.  Eyes: Conjunctivae and EOM are normal. Pupils are  equal, round, and reactive to light.  Neck: Normal range of motion. Neck supple.  Cardiovascular: Normal rate, regular rhythm, normal heart sounds and intact distal pulses.  Exam reveals no gallop and no friction rub.   No murmur heard. Pulmonary/Chest: Effort normal and breath sounds normal. No respiratory distress. He has no wheezes. He has no rales. He exhibits no tenderness.  Abdominal: Soft. Bowel sounds are normal. He exhibits no distension and no mass. There is no tenderness. There is no rebound and no guarding.  Musculoskeletal: He exhibits tenderness. He exhibits no edema.       TTP of right hip with decreased ROM due  to pain but no obvious deformity. Good pedal pulse no normal sensation of entire RLE. Normal cap refill. Pelvis stable. FROM of bilateral UE and LLE without pain.   No TTP of entire midline spine.   Neurological: He is alert and oriented to person, place, and time. No cranial nerve deficit.  Skin: Skin is warm and dry. No rash noted. He is not diaphoretic. No erythema.  Psychiatric: He has a normal mood and affect.    ED Course  Procedures (including critical care time)  Iv fentanyl and zofran  Labs Reviewed  DIFFERENTIAL - Abnormal; Notable for the following:    Neutrophils Relative 79 (*)    Lymphocytes Relative 11 (*)    All other components within normal limits  BASIC METABOLIC PANEL - Abnormal; Notable for the following:    Sodium 134 (*)    Glucose, Bld 112 (*)    GFR calc non Af Amer 61 (*)    GFR calc Af Amer 70 (*)    All other components within normal limits  PROTIME-INR - Abnormal; Notable for the following:    Prothrombin Time 28.6 (*)    INR 2.64 (*)    All other components within normal limits  APTT - Abnormal; Notable for the following:    aPTT 42 (*)    All other components within normal limits  URINALYSIS, ROUTINE W REFLEX MICROSCOPIC - Abnormal; Notable for the following:    APPearance CLOUDY (*)    Protein, ur 30 (*)    All other components within normal limits  GLUCOSE, CAPILLARY - Abnormal; Notable for the following:    Glucose-Capillary 101 (*)    All other components within normal limits  URINE MICROSCOPIC-ADD ON - Abnormal; Notable for the following:    Squamous Epithelial / LPF FEW (*)    Bacteria, UA FEW (*)    Casts HYALINE CASTS (*) GRANULAR CAST   All other components within normal limits  CBC  ETHANOL   Dg Chest 2 View  04/13/2012  *RADIOLOGY REPORT*  Clinical Data: History of fall complaining of right-sided hip pain.  CHEST - 2 VIEW  Comparison: Chest x-ray 06/18/2010.  Findings: Lung volumes are normal.  No consolidative airspace disease.   No pleural effusions.  No pneumothorax.  No pulmonary nodule or mass noted.  Pulmonary vasculature and the cardiomediastinal silhouette are within normal limits.  IMPRESSION: 1. No radiographic evidence of acute cardiopulmonary disease.  Original Report Authenticated By: Florencia Reasons, M.D.   Dg Hip Complete Right  04/13/2012  *RADIOLOGY REPORT*  Clinical Data: Fall, right hip pain  RIGHT HIP - COMPLETE 2+ VIEW  Comparison: 07/03/2010  Findings: Three views of the right hip submitted.  No acute fracture or subluxation.  Diffuse osteopenia.  There is bilateral hip prosthesis in anatomic alignment. No evidence of loosening of the right hip  prosthesis.  IMPRESSION: No acute fracture or subluxation.  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  Original Report Authenticated By: Natasha Mead, M.D.   Ct Head Wo Contrast  04/13/2012  *RADIOLOGY REPORT*  Clinical Data: Fall.  Head injury.  Prostate cancer.  CT HEAD WITHOUT CONTRAST  Technique:  Contiguous axial images were obtained from the base of the skull through the vertex without contrast.  Comparison: MRI 12/22/2007  Findings: Generalized atrophy.  Chronic right frontal infarct is unchanged.  No acute infarct.  Negative for hemorrhage. 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  No mass or edema is present.  Negative for skull fracture.  IMPRESSION: Chronic right frontal lobe infarct.  No acute abnormality.  Original Report Authenticated By: Camelia Phenes, M.D.   Ct Hip Right Wo Contrast  04/13/2012  *RADIOLOGY REPORT*  Clinical Data: Fall.  Right hip pain.  CT OF THE RIGHT HIP WITHOUT CONTRAST  Technique:  Multidetector CT imaging was performed according to the standard protocol. Multiplanar CT image reconstructions were also generated.  Comparison: None.  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  There is no evidence of periprosthetic fracture involving the acetabulum. Partial ankylosis of the  right SI joint is incidentally noted.  The right obturator ring appears intact.  Pubic symphysis degenerative disease is present.  There is no periprosthetic fracture in the right femur.  Mild atherosclerosis is present.  Prostatic brachytherapy seeds are incidentally noted.  Visceral pelvis appears normal aside from atherosclerosis.  The femoral stem demonstrates some lateral migration of the tip. There is mild cortical thickening lateral to the femoral stem on scout images and radiography.  This is confirmed on CT.  IMPRESSION: No acute osseous abnormality.  No periprosthetic fracture is identified.  There is beam hardening and scatter artifact associated with the right total hip arthroplasty that degrades the study.  Original Report Authenticated By: Andreas Newport, M.D.     1. Syncope   2. Right hip pain       MDM  Dr. Waymon Amato to admit for syncope and hip pain. VSS. No neuro focal findings.         [place], [place] 04/13/12 1404

## 2012-04-13 NOTE — H&P (Signed)
PCP:   Mickie Hillier, MD, MD   Chief Complaint:  Dizziness, possibly passed out and fell and right hip pain.  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 Patient indicates that he has been stressed at work and has not been eating or drinking as much as he should. He worked all day without eating anything yesterday. He had his dinner and was sitting on a reclining chair. At approximately 9 PM he got up to use the restroom. He took 5 or 6 steps and felt slightly dizzy and darkness in front of his eyes. He says he's not sure if he passed out and even if he did he was momentary. He fell to his right side hitting his right hip and also hit his head to the wall. He denies any preceding her subsequent chest pain, palpitations or dyspnea or headache. There was no bleeding or drainage from his ear nose or throat. His wife apparently witnessed the fall and checked his blood pressure when he was on the floor and found his systolic blood pressure in the 60 mmHg range. He was able to stand up with assistance and gingerly ambulate to his bed. His wife then got his walker and he ambulate to the bathroom but had pain on weightbearing on his right hip. He returned in went to bed and denied any complaints overnight. This morning he again tried to ambulate but was painful on his right hip. He presented to the emergency department were CT of the head, x-ray of the right hip, CT scan of the right hip were negative for acute findings. He however was found to be orthostatic. The hospitalist service was requested to admit her for further evaluation and management. He complains of 9-10 over 10 pain especially on movement of the right hip. If he holds it still when he has no pain. Pain is also worse on  weightbearing on the right hip. There is no radiation of the pain. Pain is improved after medications that he received in the emergency department.  Past Medical History: Past Medical History  Diagnosis Date  . Diabetes mellitus   . DVT (deep venous thrombosis)   . PE (pulmonary embolism)   . Prostate ca dx'd 05/2010    Past Surgical History: Past Surgical History  Procedure Date  . Total hip arthroplasty     Allergies:   Allergies  Allergen Reactions  . Sulfonamide Derivatives     REACTION: itching    Medications: Prior to Admission medications   Medication Sig Start Date End Date Taking? Authorizing Provider  atorvastatin (LIPITOR) 80 MG tablet Take 80 mg by mouth at bedtime.   Yes Historical Provider, MD  desvenlafaxine (PRISTIQ) 100 MG 24 hr tablet Take 1 tablet (100 mg total) by mouth daily. For anxiety and depression. 12/21/11  Yes Verne Spurr, PA-C  metFORMIN (GLUCOPHAGE) 500 MG tablet Take 500 mg by mouth at bedtime.   Yes Historical Provider, MD  warfarin (COUMADIN) 7.5 MG tablet Take 7.5 mg by mouth daily.   Yes Historical Provider, MD    Family History: Family History  Problem Relation Age of Onset  . Heart attack Father     Social History:  reports that he has been smoking Cigarettes.  He has a 30 pack-year smoking history. He has never used smokeless  tobacco. He reports that he drinks alcohol. He reports that he does not use illicit drugs. 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 He last drank 4 beers 3 days ago but has not had any prior to that 4 months and has not had any since Friday.  Review of Systems:  All systems reviewed and apart from history of presenting illness or negative. Patient denies any dysuria or urinary frequency. He does volunteer 2 intermittent dizziness when he bends up to pick something and then gets up. He however has not had any prior episodes of passing out or falling. He is unsure but thinks that he may be on Coumadin  anticoagulation for life but cannot tell the indication for prolonged anticoagulation  Physical Exam: Filed Vitals:   04/13/12 1506 04/13/12 1510 04/13/12 1520 04/13/12 1614  BP: 101/60 58/46 51/33    Pulse: 67 67  76  Temp:    98.3 F (36.8 C)  TempSrc:    Oral  Resp:      Height:      Weight:      SpO2:       General appearance: Moderately built and nourished male patient who is lying comfortably in the gurney and is in no obvious distress.  Head: Nontraumatic and normocephalic.  Eyes: Pupils equally reacting to light and accommodation.  Ears: Normal  Nose: No acute findings. No sinus tenderness.  Throat: Mucosa is dry. No oral thrush.  Neck: Supple. No JVD or carotid bruit. Lymph nodes: No lymphadenopathy.  Resp: Clear to auscultation. No increased work of breathing.  Cardio: First and second heart sounds heard, regular rate and rythm. No murmurs or JVD or gallop or pedal edema.   GI: Non distended. Soft and nontender. No organomegaly or masses appreciated. Normal bowel sounds heard.  Extremities: symmetric 5/5 power. Right lower extremity power assessment is limited secondary to pain. No acute findings around the right hip except for mild local tenderness. Skin: No other acute findings.  Neurologic: Alert and oriented. No focal neurological deficits.   Labs on Admission:   Basename 04/13/12 1045  NA 134*  K 4.1  CL 99  CO2 25  GLUCOSE 112*  BUN 22  CREATININE 1.22  CALCIUM 9.0  MG --  PHOS --   No results found for this basename: AST:2,ALT:2,ALKPHOS:2,BILITOT:2,PROT:2,ALBUMIN:2 in the last 72 hours No results found for this basename: LIPASE:2,AMYLASE:2 in the last 72 hours  Basename 04/13/12 1045  WBC 7.4  NEUTROABS 5.8  HGB 16.3  HCT 47.5  MCV 93.9  PLT 153   No results found for this basename: CKTOTAL:3,CKMB:3,CKMBINDEX:3,TROPONINI:3 in the last 72 hours No results found for this basename: TSH,T4TOTAL,FREET3,T3FREE,THYROIDAB in the last 72 hours No  results found for this basename: VITAMINB12:2,FOLATE:2,FERRITIN:2,TIBC:2,IRON:2,RETICCTPCT:2 in the last 72 hours  Radiological Exams on Admission: Dg Chest 2 View  04/13/2012  *RADIOLOGY REPORT*  Clinical Data: History of fall complaining of right-sided hip pain.  CHEST - 2 VIEW  Comparison: Chest x-ray 06/18/2010.  Findings: Lung volumes are normal.  No consolidative airspace disease.  No pleural effusions.  No pneumothorax.  No pulmonary nodule or mass noted.  Pulmonary vasculature and the cardiomediastinal silhouette are within normal limits.  IMPRESSION: 1. No radiographic evidence of acute cardiopulmonary disease.  Original Report Authenticated By: Florencia Reasons, M.D.   Dg Hip Complete Right  04/13/2012  *RADIOLOGY REPORT*  Clinical Data: Fall, right hip pain  RIGHT HIP - COMPLETE 2+ VIEW  Comparison: 07/03/2010  Findings: Three views of the  right hip submitted.  No acute fracture or subluxation.  Diffuse osteopenia.  There is bilateral hip prosthesis in anatomic alignment. No evidence of loosening of the right hip prosthesis.  IMPRESSION: No acute fracture or subluxation.  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  Original Report Authenticated By: Natasha Mead, M.D.   Ct Head Wo Contrast  04/13/2012  *RADIOLOGY REPORT*  Clinical Data: Fall.  Head injury.  Prostate cancer.  CT HEAD WITHOUT CONTRAST  Technique:  Contiguous axial images were obtained from the base of the skull through the vertex without contrast.  Comparison: MRI 12/22/2007  Findings: Generalized atrophy.  Chronic right frontal infarct is unchanged.  No acute infarct.  Negative for hemorrhage. 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  No mass or edema is present.  Negative for skull fracture.  IMPRESSION: Chronic right frontal lobe infarct.  No acute abnormality.  Original Report Authenticated By: Camelia Phenes, M.D.   Ct Hip Right Wo Contrast  04/13/2012  *RADIOLOGY REPORT*   Clinical Data: Fall.  Right hip pain.  CT OF THE RIGHT HIP WITHOUT CONTRAST  Technique:  Multidetector CT imaging was performed according to the standard protocol. Multiplanar CT image reconstructions were also generated.  Comparison: None.  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  There is no evidence of periprosthetic fracture involving the acetabulum. Partial ankylosis of the right SI joint is incidentally noted.  The right obturator ring appears intact.  Pubic symphysis degenerative disease is present.  There is no periprosthetic fracture in the right femur.  Mild atherosclerosis is present.  Prostatic brachytherapy seeds are incidentally noted.  Visceral pelvis appears normal aside from atherosclerosis.  The femoral stem demonstrates some lateral migration of the tip. There is mild cortical thickening lateral to the femoral stem on scout images and radiography.  This is confirmed on CT.  IMPRESSION: No acute osseous abnormality.  No periprosthetic fracture is identified.  There is beam hardening and scatter artifact associated with the right total hip arthroplasty that degrades the study.  Original Report Authenticated By: Andreas Newport, M.D.   EKG: Sinus rhythm at 63 beats per minute, normal axis, right bundle branch block, and no other acute findings. QTC is 422 ms.   Assessment/Plan Present on Admission:  .Syncope and collapse .Orthostatic hypotension .Dehydration .Hip pain, acute, right .DIABETES, TYPE 2  1. Syncope and collapse: Possibly secondary to significant orthostatic hypotension. Admit to telemetry. We'll obtain 2-D echocardiogram and carotid Dopplers. Cycle cardiac enzymes. 2. Orthostatic hypotension:? Secondary to dehydration from poor oral intake. Patient is not on any oral antihypertensive medications. IV fluid hydration, thigh high compression stockings and monitor. 3. Dehydration: Secondary to poor oral intake. IV fluid hydration. 4. Right hip pain, secondary to  fall: No acute findings on CT of the right hip on x-rays. Pain management and physical therapy and occupational therapy evaluation. 5. Type 2 diabetes mellitus: Continue metformin and place on sliding scale insulin. 6. History of DVT and pulmonary embolism: Unclear if patient has prothrombotic state. Patient is on long-term anticoagulation. Will defer management to his primary care physician. Continue Coumadin per pharmacy. 7. History of TIA: No residual deficits. May consider low dose aspirin as an outpatient. 8. History of anxiety and depression: Recently started on Pristiq.  , 04/13/2012, 4:23 PM

## 2012-04-13 NOTE — ED Provider Notes (Signed)
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  ST/T Wave abnormalities: normal  Conduction Disutrbances:right bundle branch block  Narrative Interpretation: Abnormal EKG.    Old EKG Reviewed: changes noted--RBBB has developed since last tracing of 06/19/2010.      Carleene Cooper III, MD 04/13/12 1054

## 2012-04-13 NOTE — Progress Notes (Signed)
ANTICOAGULATION CONSULT NOTE - Initial Consult  Pharmacy Consult for Warfarin Indication: VHx of DVT/PE  Allergies  Allergen Reactions  . Sulfonamide Derivatives     REACTION: itching    Patient Measurements: Height: 6' (182.9 cm) Weight: 205 lb (92.987 kg) IBW/kg (Calculated) : 77.6   Vital Signs: Temp: 98.1 F (36.7 C) (05/15 1634) Temp src: Oral (05/15 1634) BP: 113/69 mmHg (05/15 1634) Pulse Rate: 68  (05/15 1634)  Labs:  Basename 04/13/12 1045  HGB 16.3  HCT 47.5  PLT 153  APTT 42*  LABPROT 28.6*  INR 2.64*  HEPARINUNFRC --  CREATININE 1.22  CKTOTAL --  CKMB --  TROPONINI --    Estimated Creatinine Clearance: 66.3 ml/min (by C-G formula based on Cr of 1.22).   Medical History: Past Medical History  Diagnosis Date  . Diabetes mellitus   . DVT (deep venous thrombosis)   . PE (pulmonary embolism)   . Prostate ca dx'd 05/2010    Medications:  Scheduled:    . sodium chloride   Intravenous STAT  . atorvastatin  80 mg Oral QHS  . desvenlafaxine  100 mg Oral Daily  . docusate sodium  100 mg Oral BID  . fentaNYL  50 mcg Intravenous Once  . insulin aspart  0-9 Units Subcutaneous TID WC  . metFORMIN  500 mg Oral q1800  . ondansetron  4 mg Intravenous Once  . pneumococcal 23 valent vaccine  0.5 mL Intramuscular Tomorrow-1000  . sodium chloride  1,000 mL Intravenous Once  . sodium chloride  3 mL Intravenous Q12H  . DISCONTD: sodium chloride  500 mL Intravenous Once   Infusions:    . sodium chloride     PRN: acetaminophen, acetaminophen, albuterol, HYDROcodone-acetaminophen, morphine injection, ondansetron (ZOFRAN) IV, ondansetron, DISCONTD: fentaNYL, DISCONTD: ondansetron (ZOFRAN) IV  Assessment: 66 yo M with admitted 5/15 after falling and hitting his head and hip. 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 Pt noted decreased PO intake. Head CT showed no acute abnormality. Current  INR therapeutic (2.64). Home warfarin dose reported as 7.5mg  daily, last taken 04/12/12.   Goal of Therapy:  INR 2-3   Plan:  1) Warfarin 7.5mg  PO x1 at 18:00 2) Daily INR 3) Pharmacy will follow up daily  Darrol Angel, PharmD Pager: [phone] 04/13/2012,4:45 PM

## 2012-04-13 NOTE — ED Notes (Signed)
EMS gave him Fentnyl 100 mics LFA

## 2012-04-13 NOTE — ED Notes (Signed)
[id number]<HQ> Expected date:<BR> Expected time:<BR> Means of arrival:<BR> Comments:<BR> Possible hip dislocation/fall

## 2012-04-13 NOTE — ED Provider Notes (Signed)
Medical screening examination/treatment/procedure(s) were conducted as a shared visit with non-physician practitioner(s) and myself.  I personally evaluated the patient during the encounter 66 yo man had syncopla episode, fell, now has bad pain in the right hip.  He has a history of bilateral hip replacements and of DVT.  Exam shows him with pain elicited on range of motion of the right hip, heart and lungs negative.  X-rays and CT of hips negative.  Lab workjup and EKG negative.  Advised admission for syncope observation.   Carleene Cooper III, MD 04/13/12 2046

## 2012-04-14 DIAGNOSIS — E860 Dehydration: Secondary | ICD-10-CM

## 2012-04-14 DIAGNOSIS — I951 Orthostatic hypotension: Secondary | ICD-10-CM

## 2012-04-14 DIAGNOSIS — I517 Cardiomegaly: Secondary | ICD-10-CM

## 2012-04-14 DIAGNOSIS — M25559 Pain in unspecified hip: Secondary | ICD-10-CM

## 2012-04-14 DIAGNOSIS — R55 Syncope and collapse: Secondary | ICD-10-CM

## 2012-04-14 LAB — BASIC METABOLIC PANEL
Calcium: 8.8 mg/dL (ref 8.4–10.5)
Creatinine, Ser: 1.07 mg/dL (ref 0.50–1.35)
GFR calc Af Amer: 82 mL/min — ABNORMAL LOW (ref >90)
GFR calc non Af Amer: 71 mL/min — ABNORMAL LOW (ref >90)

## 2012-04-14 LAB — CARDIAC PANEL(CRET KIN+CKTOT+MB+TROPI)
CK, MB: 1.5 ng/mL (ref 0.3–4.0)
Relative Index: INVALID (ref 0.0–2.5)
Troponin I: <0.3 ng/mL (ref <0.30)

## 2012-04-14 LAB — GLUCOSE, CAPILLARY
Glucose-Capillary: 86 mg/dL (ref 70–99)
Glucose-Capillary: 125 mg/dL — ABNORMAL HIGH (ref 70–99)
Glucose-Capillary: 132 mg/dL — ABNORMAL HIGH (ref 70–99)

## 2012-04-14 LAB — PROTIME-INR
INR: 2.83 — ABNORMAL HIGH (ref 0.00–1.49)
Prothrombin Time: 30.2 seconds — ABNORMAL HIGH (ref 11.6–15.2)

## 2012-04-14 MED ORDER — WARFARIN SODIUM 7.5 MG PO TABS
7.5000 mg | ORAL_TABLET | Freq: Once | ORAL | Status: AC
  Administered 2012-04-14: 7.5 mg via ORAL
  Filled 2012-04-14: qty 1

## 2012-04-14 NOTE — Progress Notes (Signed)
Bilateral carotid artery duplex completed.  Preliminary report is no evidence of significant ICA stenosis. 

## 2012-04-14 NOTE — Progress Notes (Signed)
   CARE MANAGEMENT NOTE 04/14/2012  Patient:  [name],[name]   Account Number:  [id number]  Date Initiated:  04/14/2012  Documentation initiated by:  Jiles Crocker  Subjective/Objective Assessment:   ADMITTED WITH SYNCOPAL EPISODE     Action/Plan:   PCP: [name] Hillier, MD; CONTINUES TO WORK, LIVES AT HOME WITH SPOUSE   Anticipated DC Date:  04/21/2012   Anticipated DC Plan:  HOME/SELF CARE           Status of service:  In process, will continue to follow Medicare Important Message given?  NA - LOS <3 / Initial given by admissions (If response is "NO", the following Medicare IM given date fields will be blank)  Per UR Regulation:  Reviewed for med. necessity/level of care/duration of stay  Comments:  04/14/2012- B  RN, BSN, MHA

## 2012-04-14 NOTE — Evaluation (Addendum)
Physical Therapy Evaluation Patient Details Name: [name] [name] MRN: [id number] DOB: [birth year] Today's Date: 04/14/2012 Time: 4098-1191 PT Time Calculation (min): 11 min  PT Assessment / Plan / Recommendation Clinical Impression  Pt presents with diagnosis of syncope, fall. Pt currently denying lightheadedness, dizziness during ambulation. Pt is having significant pain R hip, anterior/lateral thigh area down to slightly above knee. Pt is awaiting ortho consult to review imaging and make any recommendations.     PT Assessment  Patient needs continued PT services    Follow Up Recommendations  Outpatient PT vs no follow-up (depending on progress)    Barriers to Discharge        lEquipment Recommendations  None recommended by PT    Recommendations for Other Services     Frequency Min 3X/week    Precautions / Restrictions Precautions Precautions: Fall Precaution Comments: Prior hip replacement on R  Restrictions Weight Bearing Restrictions: No Other Position/Activity Restrictions: Pt self-limiting due to pain-using PWB pattern   Pertinent Vitals/Pain       Mobility  Bed Mobility Bed Mobility: Supine to Sit;Sit to Supine Supine to Sit: 6: Modified independent (Device/Increase time) Sit to Supine: 6: Modified independent (Device/Increase time) Details for Bed Mobility Assistance: Pt assisted R LE with UE Transfers Transfers: Stand to Sit;Sit to Stand Sit to Stand: 5: Supervision Stand to Sit: 5: Supervision Details for Transfer Assistance: VCs safety, hand placement Ambulation/Gait Ambulation Distance (Feet): 60 Feet Assistive device: Rolling walker Ambulation/Gait Assistance Details: Pt using PWB pattern due to pain.  Gait Pattern: Antalgic;Step-to pattern;Decreased weight shift to right;Decreased stance time - right    Exercises     PT Diagnosis: Difficulty walking;Abnormality of gait;Acute pain  PT Problem List: Pain;Decreased mobility;Decreased activity  tolerance PT Treatment Interventions: DME instruction;Gait training;Stair training;Functional mobility training;Therapeutic activities;Therapeutic exercise;Patient/family education   PT Goals Acute Rehab PT Goals PT Goal Formulation: With patient Time For Goal Achievement: 04/21/12 Potential to Achieve Goals: Good Pt will go Sit to Stand: with modified independence PT Goal: Sit to Stand - Progress: Goal set today Pt will go Stand to Sit: with modified independence PT Goal: Stand to Sit - Progress: Goal set today Pt will Ambulate: 51 - 150 feet;with modified independence;with least restrictive assistive device PT Goal: Ambulate - Progress: Goal set today  Visit Information  Last PT Received On: 04/14/12 Assistance Needed: +1    Subjective Data  Subjective: "I'm still favoring this R leg" Patient Stated Goal: Less pain. Home   Prior Functioning  Home Living Lives With: Spouse Available Help at Discharge: Other (Comment) (spouse evenings--she is a Runner, broadcasting/film/video) Type of Home: House Bathroom Shower/Tub: Health visitor: Handicapped height Additional Comments: wife states they have all DME Prior Function Level of Independence: Independent Driving: Yes Vocation: Full time employment Communication Communication: No difficulties    Cognition  Overall Cognitive Status: Appears within functional limits for tasks assessed/performed Arousal/Alertness: Awake/alert Orientation Level: Appears intact for tasks assessed Behavior During Session: [facility] for tasks performed    Extremity/Trunk Assessment Right Upper Extremity Assessment RUE ROM/Strength/Tone: [facility] for tasks assessed Left Upper Extremity Assessment LUE ROM/Strength/Tone: WFL for tasks assessed Right Lower Extremity Assessment RLE ROM/Strength/Tone: Deficits RLE ROM/Strength/Tone Deficits: Had pt peform AROM hip flexion, extension, abduction in standing. Reported pain with hip extension. Denied pain with hip flexion,  some difficulty with hip abduction RLE Coordination: WFL - gross motor Left Lower Extremity Assessment LLE ROM/Strength/Tone: WFL for tasks assessed LLE Coordination: WFL - gross motor   Balance    End  of Session PT - End of Session Equipment Utilized During Treatment: Gait belt Activity Tolerance: Patient limited by pain Patient left: in bed;with call bell/phone within reach;with family/visitor present   Rebeca Alert [facility] 04/14/2012, 11:46 AM [phone]

## 2012-04-14 NOTE — Evaluation (Signed)
Occupational Therapy Evaluation Patient Details Name: [name] [name] MRN: [id number] DOB: Apr 14, 1946 Today's Date: 04/14/2012 Time:  1010-1025     OT Assessment / Plan / Recommendation Clinical Impression  This 66 year old man was admitted with syncope; he experienced dizziness and fall.  he has a PMH including DM, TIA, Prostate CA and R THA.  During OT eval, orthostatic BPs were taken by NT.  Pt initially orthostatic from sit to stand but this resolved when BP taken 2 minutes later.  Pt was asymptomatic.  Pt is overall set up/supervision vs. min guard to gather supplies without any LOB, although pt does favor LLE.  No further OT is needed at this time.      OT Assessment  Patient does not need any further OT services    Follow Up Recommendations  No OT follow up    Barriers to Discharge      Equipment Recommendations  None recommended by OT    Recommendations for Other Services    Frequency       Precautions / Restrictions Precautions Precautions: None Restrictions Weight Bearing Restrictions: No   Pertinent Vitals/Pain 5/10  Pt initially orthostatic with sit to stand but asymptomatic    ADL  Eating/Feeding: Simulated;Independent Where Assessed - Eating/Feeding: Edge of bed Grooming: Simulated;Supervision/safety Where Assessed - Grooming: Supported standing Upper Body Bathing: Simulated;Set up Where Assessed - Upper Body Bathing: Unsupported sitting Lower Body Bathing: Simulated;Min A (IV) Where Assessed - Lower Body Bathing: Supported sit to stand Upper Body Dressing: Simulated;supervision; safety Where Assessed - Upper Body Dressing: Unsupported sitting Lower Body Dressing: Simulated;Supervision/safety;Other (comment) (extra time for RLE) Where Assessed - Lower Body Dressing: Sopported sit to stand Toilet Transfer: Performed;Min guard;Other (comment) Toilet Transfer Method:  (ambulate) Toilet Transfer Equipment: Comfort height toilet;Grab bars Toileting - Clothing  Manipulation and Hygiene: Simulated;Supervision/safety Where Assessed - Engineer, mining and Hygiene: Standing Equipment Used: Rolling walker Transfers/Ambulation Related to ADLs: used RW with min guard due to h/o blacking out.  No LOB.  Favors L LE ADL Comments: able to reach to RLE:  sore and extra time needed.  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   Prior [employer] Living Lives With: Spouse Available Help at Discharge: Other (Comment) (spouse evenings--she is a Runner, broadcasting/film/video) Type of Home: House Bathroom Shower/Tub: Health visitor: Handicapped height Prior Function Level of Independence: Independent Driving: Yes Vocation: Full time employment Communication Communication: No difficulties    Cognition  Overall Cognitive Status: Appears within functional limits for tasks assessed/performed Arousal/Alertness: Awake/alert Orientation Level: Appears intact for tasks assessed Behavior During Session: [facility] for tasks performed    Extremity/Trunk Assessment Right Upper Extremity Assessment RUE ROM/Strength/Tone: [facility] for tasks assessed Left Upper Extremity Assessment LUE ROM/Strength/Tone: WFL for tasks assessed   Mobility Bed Mobility Bed Mobility: Sit to Supine Sit to Supine: 6: Modified independent (Device/Increase time);Other (comment) (extra time; assisted RLE with UEs)   Exercise    Balance    End of Session OT - End of Session Activity Tolerance: Patient tolerated treatment well Patient left: in bed;with call bell/phone within reach   [facility] 04/14/2012, 10:36  AM Marica Otter, OTR/L [phone] 04/14/2012

## 2012-04-14 NOTE — Progress Notes (Signed)
ANTICOAGULATION CONSULT NOTE - Initial Consult  Pharmacy Consult for Warfarin Indication: VHx of DVT/PE  Allergies  Allergen Reactions  . Sulfonamide Derivatives     REACTION: itching    Patient Measurements: Height: 6' (182.9 cm) Weight: 205 lb (92.987 kg) IBW/kg (Calculated) : 77.6   Vital Signs: Temp: 97.4 F (36.3 C) (05/16 0607) Temp src: Oral (05/16 0607) BP: 154/85 mmHg (05/16 1015) Pulse Rate: 74  (05/16 1015)  Labs:  Basename 04/14/12 0405 04/13/12 2223 04/13/12 1750 04/13/12 1045  HGB -- -- -- 16.3  HCT -- -- -- 47.5  PLT -- -- -- 153  APTT -- -- -- 42*  LABPROT 30.2* -- -- 28.6*  INR 2.83* -- -- 2.64*  HEPARINUNFRC -- -- -- --  CREATININE 1.07 -- -- 1.22  CKTOTAL 50 49 58 --  CKMB 1.5 1.8 2.0 --  TROPONINI <0.30 <0.30 <0.30 --    Estimated Creatinine Clearance: 75.5 ml/min (by C-G formula based on Cr of 1.07).   Medical History: Past Medical History  Diagnosis Date  . Diabetes mellitus   . DVT (deep venous thrombosis)   . PE (pulmonary embolism)   . Prostate ca dx'd 05/2010    Medications:  Scheduled:     . sodium chloride   Intravenous STAT  . atorvastatin  80 mg Oral QHS  . desvenlafaxine  100 mg Oral Daily  . docusate sodium  100 mg Oral BID  . fentaNYL  50 mcg Intravenous Once  . insulin aspart  0-9 Units Subcutaneous TID WC  . metFORMIN  500 mg Oral q1800  . ondansetron  4 mg Intravenous Once  . pneumococcal 23 valent vaccine  0.5 mL Intramuscular Tomorrow-1000  . sodium chloride  1,000 mL Intravenous Once  . sodium chloride  3 mL Intravenous Q12H  . warfarin  7.5 mg Oral ONCE-1800  . Warfarin - Pharmacist Dosing Inpatient   Does not apply q1800  . DISCONTD: sodium chloride  500 mL Intravenous Once   Infusions:     . sodium chloride 125 mL/hr at 04/14/12 0355   PRN: acetaminophen, acetaminophen, albuterol, HYDROcodone-acetaminophen, morphine injection, ondansetron (ZOFRAN) IV, ondansetron, DISCONTD: fentaNYL, DISCONTD:  ondansetron (ZOFRAN) IV  Assessment:  66 yo M with admitted 5/15 after falling and hitting his head and hip. Head CT showed no acute abnormality. 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 Pt noted decreased PO intake.   Home warfarin dose reported as 7.5mg  daily, last taken 04/12/12.  Warfarin dose 7.5mg  from 5/15 not charted on MAR.  No PO meal intake charted.  Current INR therapeutic (2.83)  Goal of Therapy:  INR 2-3   Plan:   Warfarin 7.5mg  PO tonight.  F/U daily INR   Lynann Beaver PharmD, BCPS Pager [phone] 04/14/2012 10:45 AM

## 2012-04-14 NOTE — Progress Notes (Signed)
Subjective:   Patient indicates that his right hip pain is slightly better. Denies any other complaints. No lightheadedness or dizziness on standing.  Objective  Vital signs in last 24 hours: Filed Vitals:   04/14/12 1009 04/14/12 1011 04/14/12 1014 04/14/12 1015  BP: 141/88 117/71 141/79 154/85  Pulse: 80 79 75 74  Temp:      TempSrc:      Resp:      Height:      Weight:      SpO2:       Weight change:   Intake/Output Summary (Last 24 hours) at 04/14/12 1748 Last data filed at 04/14/12 1610  Gross per 24 hour  Intake 3300.5 ml  Output    725 ml  Net 2575.5 ml    Physical Exam:  General Exam: Comfortable.  Respiratory System: Clear. No increased work of breathing.  Cardiovascular System: First and second heart sounds heard. Regular rate and rhythm. No JVD/murmurs.  Gastrointestinal System: Abdomen is non distended, soft and normal bowel sounds heard.  Central Nervous System: Alert and oriented. No focal neurological deficits. Extremities: Able to move his right hip and lower extremity better than he could last night. Peripheral pulses symmetrically well felt.  Labs:  Basic Metabolic Panel:  Lab 04/14/12 2130 04/13/12 1045  NA 138 134*  K 4.4 4.1  CL 106 99  CO2 26 25  GLUCOSE 103* 112*  BUN 16 22  CREATININE 1.07 1.22  CALCIUM 8.8 9.0  ALB -- --  PHOS -- --   Liver Function Tests: No results found for this basename: AST:3,ALT:3,ALKPHOS:3,BILITOT:3,PROT:3,ALBUMIN:3 in the last 168 hours No results found for this basename: LIPASE:3,AMYLASE:3 in the last 168 hours No results found for this basename: AMMONIA:3 in the last 168 hours CBC:  Lab 04/13/12 1045  WBC 7.4  NEUTROABS 5.8  HGB 16.3  HCT 47.5  MCV 93.9  PLT 153   Cardiac Enzymes:  Lab 04/14/12 0405 04/13/12 2223 04/13/12 1750  CKTOTAL 50 49 58  CKMB 1.5 1.8 2.0  CKMBINDEX -- -- --  TROPONINI <0.30 <0.30 <0.30   CBG:  Lab 04/14/12 1155 04/14/12 0745 04/13/12 2209 04/13/12 1711 04/13/12 1044   GLUCAP 86 104* 141* 99 101*    Iron Studies: No results found for this basename: IRON,TIBC,TRANSFERRIN,FERRITIN in the last 72 hours Studies/Results: Dg Chest 2 View  04/13/2012  *RADIOLOGY REPORT*  Clinical Data: History of fall complaining of right-sided hip pain.  CHEST - 2 VIEW  Comparison: Chest x-ray 06/18/2010.  Findings: Lung volumes are normal.  No consolidative airspace disease.  No pleural effusions.  No pneumothorax.  No pulmonary nodule or mass noted.  Pulmonary vasculature and the cardiomediastinal silhouette are within normal limits.  IMPRESSION: 1. No radiographic evidence of acute cardiopulmonary disease.  Original Report Authenticated By: Florencia Reasons, M.D.   Dg Hip Complete Right  04/13/2012  *RADIOLOGY REPORT*  Clinical Data: Fall, right hip pain  RIGHT HIP - COMPLETE 2+ VIEW  Comparison: 07/03/2010  Findings: Three views of the right hip submitted.  No acute fracture or subluxation.  Diffuse osteopenia.  There is bilateral hip prosthesis in anatomic alignment. No evidence of loosening of the right hip prosthesis.  IMPRESSION: No acute fracture or subluxation.  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  Original Report Authenticated By: Natasha Mead, M.D.   Ct Head Wo Contrast  04/13/2012  *RADIOLOGY REPORT*  Clinical Data: Fall.  Head injury.  Prostate cancer.  CT HEAD WITHOUT CONTRAST  Technique:  Contiguous  axial images were obtained from the base of the skull through the vertex without contrast.  Comparison: MRI 12/22/2007  Findings: Generalized atrophy.  Chronic right frontal infarct is unchanged.  No acute infarct.  Negative for hemorrhage. 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  No mass or edema is present.  Negative for skull fracture.  IMPRESSION: Chronic right frontal lobe infarct.  No acute abnormality.  Original Report Authenticated By: Camelia Phenes, M.D.   Ct Hip Right Wo Contrast  04/13/2012  *RADIOLOGY  REPORT*  Clinical Data: Fall.  Right hip pain.  CT OF THE RIGHT HIP WITHOUT CONTRAST  Technique:  Multidetector CT imaging was performed according to the standard protocol. Multiplanar CT image reconstructions were also generated.  Comparison: None.  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  There is no evidence of periprosthetic fracture involving the acetabulum. Partial ankylosis of the right SI joint is incidentally noted.  The right obturator ring appears intact.  Pubic symphysis degenerative disease is present.  There is no periprosthetic fracture in the right femur.  Mild atherosclerosis is present.  Prostatic brachytherapy seeds are incidentally noted.  Visceral pelvis appears normal aside from atherosclerosis.  The femoral stem demonstrates some lateral migration of the tip. There is mild cortical thickening lateral to the femoral stem on scout images and radiography.  This is confirmed on CT.  IMPRESSION: No acute osseous abnormality.  No periprosthetic fracture is identified.  There is beam hardening and scatter artifact associated with the right total hip arthroplasty that degrades the study.  Original Report Authenticated By: Andreas Newport, M.D.   Medications:    . sodium chloride 125 mL/hr (04/14/12 1153)      . sodium chloride   Intravenous STAT  . atorvastatin  80 mg Oral QHS  . desvenlafaxine  100 mg Oral Daily  . docusate sodium  100 mg Oral BID  . insulin aspart  0-9 Units Subcutaneous TID WC  . metFORMIN  500 mg Oral q1800  . pneumococcal 23 valent vaccine  0.5 mL Intramuscular Tomorrow-1000  . sodium chloride  1,000 mL Intravenous Once  . sodium chloride  3 mL Intravenous Q12H  . warfarin  7.5 mg Oral ONCE-1800  . warfarin  7.5 mg Oral ONCE-1800  . Warfarin - Pharmacist Dosing Inpatient   Does not apply q1800    I  have reviewed scheduled and prn medications.     Problem/Plan: Principal Problem:  *Syncope and collapse Active Problems:  DIABETES, TYPE 2   PERSONAL HISTORY, VENOUS THROMBOSIS AND EMBOLISM  TOBACCO ABUSE, HX OF  Orthostatic hypotension  Dehydration  Fall at home  Hip pain, acute, right  1. Syncope and collapse: Possibly secondary to significant orthostatic hypotension. Cardiac enzymes were cycled and negative. Carotid Dopplers negative. Echo showed normal systolic and diastolic function with LVEF of 60-65%.. 2. Orthostatic hypotension:? Secondary to dehydration from poor oral intake. Patient is not on any oral antihypertensive medications. IV fluid hydration, thigh high compression stockings and monitor. This is slightly better than on admission and currently asymptomatic. 3. Dehydration: Secondary to poor oral intake. IV fluid hydration for another day then discontinue. 4. Right hip pain, secondary to fall: No acute findings on CT of the right hip on x-rays. Pain management and physical therapy and occupational therapy evaluation. 5. Type 2 diabetes mellitus: Continue metformin and place on sliding scale insulin. Reasonable inpatient control. 6. History of DVT and pulmonary embolism: Unclear if patient has prothrombotic state.  Patient is on long-term anticoagulation. Will defer management to his primary care physician. Continue Coumadin per pharmacy. 7. History of TIA: No residual deficits. May consider low dose aspirin as an outpatient. 8. History of anxiety and depression: Recently started on Pristiq.  Disposition:? Discharge on 5/17 with home health services.  , 04/14/2012,5:48 PM  LOS: 1 day

## 2012-04-14 NOTE — Progress Notes (Signed)
Echocardiogram 2D Echocardiogram has been performed.  [name] [name] [facility] 04/14/2012, 9:49 AM

## 2012-04-15 DIAGNOSIS — M25559 Pain in unspecified hip: Secondary | ICD-10-CM

## 2012-04-15 DIAGNOSIS — E860 Dehydration: Secondary | ICD-10-CM

## 2012-04-15 DIAGNOSIS — I951 Orthostatic hypotension: Secondary | ICD-10-CM

## 2012-04-15 DIAGNOSIS — R55 Syncope and collapse: Secondary | ICD-10-CM

## 2012-04-15 LAB — GLUCOSE, CAPILLARY: Glucose-Capillary: 98 mg/dL (ref 70–99)

## 2012-04-15 MED ORDER — WARFARIN SODIUM 7.5 MG PO TABS
7.5000 mg | ORAL_TABLET | Freq: Once | ORAL | Status: DC
  Filled 2012-04-15: qty 1

## 2012-04-15 MED ORDER — HYDROCODONE-ACETAMINOPHEN 5-325 MG PO TABS: 1.0000 | ORAL_TABLET | Freq: Four times a day (QID) | ORAL | Status: AC | PRN

## 2012-04-15 NOTE — Discharge Instructions (Addendum)
Referral placed to [facility] - [phone] ( [address])

## 2012-04-15 NOTE — Discharge Summary (Signed)
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 In the emergency department patient was found to be significantly orthostatic and x-ray and CT scan of right hip and CT of the head did not show acute findings. The hospitalist service was requested to admit for further evaluation and management of syncope.   Discharge Medications Current Discharge Medication List    START taking these medications   Details  HYDROcodone-acetaminophen (NORCO) 5-325 MG per tablet Take 1-2 tablets by mouth every 6 (six) hours as needed for pain. Qty: 30 tablet, Refills: 0      CONTINUE these medications which have NOT CHANGED   Details  atorvastatin (LIPITOR) 80 MG tablet Take 80 mg by mouth at bedtime.    desvenlafaxine (PRISTIQ) 100 MG 24 hr tablet Take 1 tablet (100 mg total) by mouth daily. For anxiety and depression.    metFORMIN (GLUCOPHAGE) 500 MG tablet Take 500 mg by mouth at bedtime.    warfarin (COUMADIN) 7.5 MG tablet Take 7.5 mg by mouth daily.        Hospital Course: Syncope and collapse Present on Admission:  .Syncope and collapse .Orthostatic hypotension .Dehydration .Hip pain, acute, right .DIABETES, TYPE 2   1. Syncope and  collapse: Possibly secondary to significant orthostatic hypotension. Cardiac enzymes were cycled and negative. Carotid Dopplers negative. Echo showed normal systolic and diastolic function with LVEF of 60-65%. Physical therapy has evaluated patient and recommend outpatient PT evaluation and treatment and the patient has all DM is at home from prior hip surgeries. 2. Orthostatic hypotension: Possibly Secondary to dehydration from poor oral intake. Patient is not on any oral antihypertensive medications. Patient was hydrated with IV fluids and provided with thigh high compression stockings. He has stood up and ambulated with physical therapy and denies any dizziness or lightheadedness. His orthostatic blood pressures have improved but not completely resolved. He is advised to liberalize of his oral salt and water intake and continue to use the compression stockings with activity. He is also advised to make gradual transition from lying to sitting, sitting to standing and standing to ambulating. He verbalizes understanding. Followup with primary care physician next week. 3. Dehydration: Secondary to poor oral intake. Resolved. Patient has been stressed at work and has had irregular and poor food habits. Encouraged to be regular and hydrate adequately. 4. Right hip pain, secondary to fall: No acute findings on CT of the right hip or x-rays. Discussed with orthopedics/curbside who reviewed patient CT and did not see any fractures or acute findings. Pain is better controlled. Continue with pain management and outpatient physical therapy evaluation and treatment. 5. Type 2 diabetes mellitus: Continue metformin. 6. History of DVT and pulmonary embolism: Unclear if patient has prothrombotic state. Patient is on long-term anticoagulation. Will defer management to his primary care physician. Continue Coumadin. 7. History  of anxiety and depression: Recently started on Pristiq. Stable without any suicidal or homicidal  ideations. 8. 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 May consider low dose aspirin as an outpatient  Day of Discharge  Complaints: Denies any dizziness or lightheadedness or sensation of passing out when he stands. No chest pain or dyspnea or palpitations. Right hip pain is slowly improving.  Physical Exam:  BP 121/81  Pulse 79  Temp(Src) 98.9 F (37.2 C) (Oral)  Resp 18  Ht 6' (1.829 m)  Wt 92.987 kg (205 lb)  BMI 27.80 kg/m2  SpO2 96%  General Exam: Comfortable.  Respiratory System: Clear. No increased work of breathing.  Cardiovascular System: First and second heart sounds heard. Regular rate and rhythm. No JVD/murmurs.  Gastrointestinal System: Abdomen is non distended, soft and normal bowel sounds heard. Nontender. Central Nervous System: Alert and oriented. No focal neurological deficits.  Extremities: Right hip/lower extremity movements continue to improve with improvement in pain control. Peripheral pulses symmetrically well felt.  Basic Metabolic Panel:  Lab 04/14/12 1610 04/13/12 1045  NA 138 134*  K 4.4 4.1  CL 106 99  CO2 26 25  GLUCOSE 103* 112*  BUN 16 22  CREATININE 1.07 1.22  CALCIUM 8.8 9.0  ALB -- --  PHOS -- --   Liver Function Tests: No results found for this basename: AST:3,ALT:3,ALKPHOS:3,BILITOT:3,PROT:3,ALBUMIN:3 in the last 168 hours No results found for this basename: LIPASE:3,AMYLASE:3 in the last 168 hours No results found for this basename: AMMONIA:3 in the last 168 hours CBC:  Lab 04/13/12 1045  WBC 7.4  NEUTROABS 5.8  HGB 16.3  HCT 47.5  MCV 93.9  PLT 153   Cardiac Enzymes:  Lab 04/14/12 0405 04/13/12 2223 04/13/12 1750  CKTOTAL 50 49 58  CKMB 1.5 1.8 2.0  CKMBINDEX -- -- --  TROPONINI <0.30 <0.30 <0.30   CBG:  Lab 04/15/12 1159 04/15/12 0742 04/14/12 1954 04/14/12 1645 04/14/12 1155  GLUCAP 98 85 132* 125* 86   Other lab data:  1. INR: 2.05. 2. Urine analysis and did  not reveal features suggestive of urinary tract infection. 3. Blood alcohol level was less than 11.  Dg Chest 2 View  04/13/2012  *RADIOLOGY REPORT*  Clinical Data: History of fall complaining of right-sided hip pain.  CHEST - 2 VIEW  Comparison: Chest x-ray 06/18/2010.  Findings: Lung volumes are normal.  No consolidative airspace disease.  No pleural effusions.  No pneumothorax.  No pulmonary nodule or mass noted.  Pulmonary vasculature and the cardiomediastinal silhouette are within normal limits.  IMPRESSION: 1. No radiographic evidence of acute cardiopulmonary disease.  Original Report Authenticated By: Florencia Reasons, M.D.   Dg Hip Complete Right  04/13/2012  *RADIOLOGY REPORT*  Clinical Data: Fall, right hip pain  RIGHT HIP - COMPLETE 2+ VIEW  Comparison: 07/03/2010  Findings: Three views of the right hip submitted.  No acute fracture or subluxation.  Diffuse osteopenia.  There is bilateral hip prosthesis in anatomic alignment. No evidence of loosening of the right hip prosthesis.  IMPRESSION: No acute fracture or subluxation.  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  Original Report Authenticated By: Natasha Mead, M.D.   Ct Head Wo Contrast  04/13/2012  *RADIOLOGY REPORT*  Clinical Data: Fall.  Head injury.  Prostate cancer.  CT HEAD WITHOUT CONTRAST  Technique:  Contiguous axial images were obtained from the base of the skull through the vertex without contrast.  Comparison: MRI 12/22/2007  Findings: Generalized atrophy.  Chronic right frontal infarct is unchanged.  No acute infarct.  Negative for hemorrhage. 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  No mass or edema is present.  Negative for skull fracture.  IMPRESSION: Chronic right frontal lobe infarct.  No acute abnormality.  Original Report Authenticated By: Camelia Phenes, M.D.   Ct Hip Right Wo Contrast  04/13/2012  *RADIOLOGY REPORT*  Clinical Data: Fall.  Right hip pain.  CT OF THE  RIGHT HIP WITHOUT CONTRAST  Technique:  Multidetector CT imaging was performed according to the standard protocol. Multiplanar CT image reconstructions were also generated.  Comparison: None.  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  There is no evidence of periprosthetic fracture involving the acetabulum. Partial ankylosis of the right SI joint is incidentally noted.  The right obturator ring appears intact.  Pubic symphysis degenerative disease is present.  There is no periprosthetic fracture in the right femur.  Mild atherosclerosis is present.  Prostatic brachytherapy seeds are incidentally noted.  Visceral pelvis appears normal aside from atherosclerosis.  The femoral stem demonstrates some lateral migration of the tip. There is mild cortical thickening lateral to the femoral stem on scout images and radiography.  This is confirmed on CT.  IMPRESSION: No acute osseous abnormality.  No periprosthetic fracture is identified.  There is beam hardening and scatter artifact associated with the right total hip arthroplasty that degrades the study.  Original Report Authenticated By: Andreas Newport, M.D.     Disposition: Discharged home in stable condition.  Diet: Heart healthy and diabetic.  Activity: Increase activity gradually.   Follow-up Appts: Discharge Orders    Future Orders Please Complete By Expires   Diet - low sodium heart healthy      Diet Carb Modified      Increase activity slowly      Call MD for:  severe uncontrolled pain      Call MD for:  persistant dizziness or light-headedness      Discharge instructions      Comments:   Use both lower extremity thigh high compression stockings with activity.      TESTS THAT NEED FOLLOW-UP Continue outpatient followup of PT/INR as per prior home schedules.  Time spent on discharge, talking to the patient, and coordinating care: 30 mins.   SignedMarcellus Scott, MD 04/15/2012, 12:39 PM

## 2012-04-15 NOTE — Progress Notes (Signed)
PHYSICAL THERAPY NOTE  PT eval completed on 04/14/12. Recommended OP PT follow-up at discharge for pain management, strengthening, maximize independence. Pt has all equipment. Spoke briefly with pt about recommendations-he is okay with it. Pt reports pain a little better today-"able to put more weight through it" and he has been mobilizing unassisted with RW. Encouraged pt to continue to use RW for safety and until pain better controlled. Pt stated he does not need to practice anything prior to d/c-states he remembers how to negotiate steps with RW backwards. Also encouraged pt to follow-up with MD if symptoms persist. Thanks.  Rebeca Alert, PT  [phone]

## 2012-04-15 NOTE — Progress Notes (Signed)
Talked to the patient about outpatient physical therapy referral; patient requested [facility] rehab [phone] [place]; [phone]); clinical information faxed as requested to fax # [phone]; The outpatient rehab with call the patient for a time that he would like to be seen; B Lobbyist, BSN, [place].

## 2012-04-15 NOTE — Progress Notes (Signed)
ANTICOAGULATION CONSULT NOTE - Follow up Consult  Pharmacy Consult for Warfarin Indication: VHx of DVT/PE  Allergies  Allergen Reactions  . Sulfonamide Derivatives     REACTION: itching    Patient Measurements: Height: 6' (182.9 cm) Weight: 205 lb (92.987 kg) IBW/kg (Calculated) : 77.6   Vital Signs: Temp: 98.9 F (37.2 C) (05/17 0515) Temp src: Oral (05/17 0515) BP: 121/81 mmHg (05/17 0519) Pulse Rate: 79  (05/17 0519)  Labs:  Basename 04/15/12 0440 04/14/12 0405 04/13/12 2223 04/13/12 1750 04/13/12 1045  HGB -- -- -- -- 16.3  HCT -- -- -- -- 47.5  PLT -- -- -- -- 153  APTT -- -- -- -- 42*  LABPROT 23.5* 30.2* -- -- 28.6*  INR 2.05* 2.83* -- -- 2.64*  HEPARINUNFRC -- -- -- -- --  CREATININE -- 1.07 -- -- 1.22  CKTOTAL -- 50 49 58 --  CKMB -- 1.5 1.8 2.0 --  TROPONINI -- <0.30 <0.30 <0.30 --    Estimated Creatinine Clearance: 75.5 ml/min (by C-G formula based on Cr of 1.07).   Medical History: Past Medical History  Diagnosis Date  . Diabetes mellitus   . DVT (deep venous thrombosis)   . PE (pulmonary embolism)   . Prostate ca dx'd 05/2010    Medications:  Scheduled:     . atorvastatin  80 mg Oral QHS  . desvenlafaxine  100 mg Oral Daily  . docusate sodium  100 mg Oral BID  . insulin aspart  0-9 Units Subcutaneous TID WC  . metFORMIN  500 mg Oral q1800  . pneumococcal 23 valent vaccine  0.5 mL Intramuscular Tomorrow-1000  . sodium chloride  1,000 mL Intravenous Once  . sodium chloride  3 mL Intravenous Q12H  . warfarin  7.5 mg Oral ONCE-1800  . warfarin  7.5 mg Oral ONCE-1800  . Warfarin - Pharmacist Dosing Inpatient   Does not apply q1800   Infusions:     . sodium chloride 125 mL/hr at 04/15/12 0500    Assessment:  66 yo M with admitted 5/15 after falling and hitting his head and hip. Head CT showed no acute abnormality. 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 Pt noted  decreased PO intake.   Home warfarin dose reported as 7.5mg  daily, last taken 04/12/12.  Warfarin dose 5/15 not charted on MAR, dose 5/16 charted appropriately  Current INR decreased, remains therapeutic (2.05)  Goal of Therapy:  INR 2-3   Plan:   Warfarin 7.5mg  PO tonight.  F/U daily INR   Lynann Beaver PharmD, BCPS Pager [phone] 04/15/2012 11:27 AM

## 2012-07-19 ENCOUNTER — Ambulatory Visit (HOSPITAL_COMMUNITY)
Admission: RE | Admit: 2012-07-19 | Discharge: 2012-07-19 | Disposition: A | Discharge status: Home / Self Care | Payer: Medicare Other | Source: Ambulatory Visit | Attending: Family Medicine | Admitting: Family Medicine

## 2012-07-19 DIAGNOSIS — Z86718 Personal history of other venous thrombosis and embolism: Secondary | ICD-10-CM | POA: Insufficient documentation

## 2012-07-19 DIAGNOSIS — M79606 Pain in leg, unspecified: Secondary | ICD-10-CM

## 2012-07-19 DIAGNOSIS — M79609 Pain in unspecified limb: Secondary | ICD-10-CM

## 2012-07-19 DIAGNOSIS — R29898 Other symptoms and signs involving the musculoskeletal system: Secondary | ICD-10-CM | POA: Insufficient documentation

## 2012-07-19 DIAGNOSIS — M7989 Other specified soft tissue disorders: Secondary | ICD-10-CM

## 2012-07-19 DIAGNOSIS — Z86711 Personal history of pulmonary embolism: Secondary | ICD-10-CM | POA: Insufficient documentation

## 2012-08-04 ENCOUNTER — Other Ambulatory Visit (HOSPITAL_COMMUNITY): Payer: Self-pay | Admitting: Orthopedic Surgery

## 2012-08-04 DIAGNOSIS — M25551 Pain in right hip: Secondary | ICD-10-CM

## 2012-08-16 ENCOUNTER — Encounter (HOSPITAL_COMMUNITY): Payer: Self-pay

## 2012-08-16 ENCOUNTER — Inpatient Hospital Stay (HOSPITAL_COMMUNITY): Admission: RE | Admit: 2012-08-16 | Payer: Self-pay | Source: Ambulatory Visit

## 2012-09-15 ENCOUNTER — Encounter (HOSPITAL_COMMUNITY)
Admission: RE | Admit: 2012-09-15 | Discharge: 2012-09-15 | Disposition: A | Discharge status: Home / Self Care | Payer: Medicare Other | Source: Ambulatory Visit | Attending: Orthopedic Surgery | Admitting: Orthopedic Surgery

## 2012-09-15 DIAGNOSIS — Z96649 Presence of unspecified artificial hip joint: Secondary | ICD-10-CM | POA: Insufficient documentation

## 2012-09-15 DIAGNOSIS — M25559 Pain in unspecified hip: Secondary | ICD-10-CM | POA: Insufficient documentation

## 2012-09-15 DIAGNOSIS — Z9181 History of falling: Secondary | ICD-10-CM | POA: Insufficient documentation

## 2012-09-15 DIAGNOSIS — M25551 Pain in right hip: Secondary | ICD-10-CM

## 2012-09-15 MED ORDER — TECHNETIUM TC 99M MEDRONATE IV KIT
25.0000 | PACK | Freq: Once | INTRAVENOUS | Status: AC | PRN
  Administered 2012-09-15: 25 via INTRAVENOUS

## 2012-10-25 ENCOUNTER — Other Ambulatory Visit: Payer: Self-pay | Admitting: Family Medicine

## 2012-10-25 DIAGNOSIS — Z136 Encounter for screening for cardiovascular disorders: Secondary | ICD-10-CM

## 2012-10-31 ENCOUNTER — Ambulatory Visit
Admission: RE | Admit: 2012-10-31 | Discharge: 2012-10-31 | Disposition: A | Discharge status: Home / Self Care | Payer: Medicare Other | Source: Ambulatory Visit | Attending: Family Medicine | Admitting: Family Medicine

## 2012-10-31 DIAGNOSIS — Z136 Encounter for screening for cardiovascular disorders: Secondary | ICD-10-CM

## 2013-04-25 ENCOUNTER — Telehealth: Payer: Self-pay | Admitting: Radiation Oncology

## 2013-04-25 NOTE — Telephone Encounter (Signed)
Left patient's records at front desk for pickup.  OK per RJM.  NPE 06/26/10, EOT 02/14/11, FUP 03/10/11.

## 2013-05-12 ENCOUNTER — Telehealth: Payer: Self-pay | Admitting: Radiation Oncology

## 2013-05-12 NOTE — Telephone Encounter (Signed)
Pt never picked up his records.  Mailed to him today.

## 2013-09-11 ENCOUNTER — Other Ambulatory Visit: Payer: Self-pay

## 2013-10-15 ENCOUNTER — Encounter: Payer: Self-pay | Admitting: *Deleted

## 2013-10-15 ENCOUNTER — Encounter: Payer: Self-pay | Admitting: Cardiology

## 2013-10-17 ENCOUNTER — Encounter: Payer: Self-pay | Admitting: Cardiology

## 2013-10-17 ENCOUNTER — Ambulatory Visit (INDEPENDENT_AMBULATORY_CARE_PROVIDER_SITE_OTHER): Payer: Medicare Other | Admitting: Cardiology

## 2013-10-17 VITALS — BP 110/90 | HR 96 | Ht 72.0 in | Wt 217.0 lb

## 2013-10-17 DIAGNOSIS — I951 Orthostatic hypotension: Secondary | ICD-10-CM

## 2013-10-17 DIAGNOSIS — R55 Syncope and collapse: Secondary | ICD-10-CM

## 2013-10-17 LAB — BASIC METABOLIC PANEL
CO2: 26 mEq/L (ref 19–32)
Chloride: 103 mEq/L (ref 96–112)
Potassium: 3.7 mEq/L (ref 3.5–5.1)
Sodium: 135 mEq/L (ref 135–145)

## 2013-10-17 NOTE — Progress Notes (Signed)
[address] [place], [place]  [address] Phone: [phone] Fax:  [phone]  Date:  10/17/2013   ID:  [name] Hall, DOB 07/10/1946, MRN [id number]  PCP:  Mickie Hillier, MD   History of Present Illness: [name] Hall is a 67 y.o. 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  He was on a hot roof the day of the incident and may have been dehydrated. He remembers feeling dizzy and eventually passed out and fell on his right hip prosthesis. Workup was done in the hospital which included ECHO (normal EF), carotid Dopplers (normal), head CT. After being IV fluid hydrated he felt better. Compression hose were given. He has not had symptoms since. When seeing Dr. Clarene Duke, orthostatics were positive. Asymptomatic. He has never had HTN he states. +DM. No exercise induced syncope. No angina, no SOB, no fevers, no rashes. RBBB noted on ECG. Tele in hospital unremarkable.   On 07/07/13 - got up chair, walked 10 steps and passed out. BP was 60/40. Ten min later 120/80. At this visit I started Florinef.    Wt Readings from Last 3 Encounters:  04/13/12 205 lb (92.987 kg)  12/18/11 205 lb (92.987 kg)  12/17/11 215 lb (97.523 kg)     Past Medical History  Diagnosis Date  . Diabetes mellitus   . DVT (deep venous thrombosis)   . PE (pulmonary embolism)   . Prostate ca   . Orthostatic hypotension   . PROSTATE CANCER   . PERSONAL HISTORY, VENOUS THROMBOSIS AND EMBOLISM     Past Surgical History  Procedure Laterality Date  . Total hip arthroplasty      Current Outpatient Prescriptions  Medication Sig Dispense Refill  . atorvastatin (LIPITOR) 80 MG tablet Take 80 mg by mouth at bedtime.      Marland Kitchen desvenlafaxine (PRISTIQ) 100 MG 24 hr tablet Take 1 tablet (100 mg total) by mouth daily. For anxiety and depression.      .  metFORMIN (GLUCOPHAGE) 500 MG tablet Take 500 mg by mouth at bedtime.      Marland Kitchen warfarin (COUMADIN) 7.5 MG tablet Take 7.5 mg by mouth daily.       No current facility-administered medications for this visit.    Allergies:    Allergies  Allergen Reactions  . Sulfonamide Derivatives     REACTION: itching    Social History:  The patient  reports that he has been smoking Cigarettes.  He has a 30 pack-year smoking history. He has never used smokeless tobacco. He reports that he drinks alcohol. He reports that he does not use illicit drugs.   ROS:  Please see the history of present illness.   Currently with a head cold. No syncope, no orthopnea, no further dizziness, no collapse, no chest pain   PHYSICAL EXAM: VS:  There were no vitals taken for this visit. Well nourished, well developed, in no acute distress HEENT: normal Neck: no JVD Cardiac:  normal S1, S2; RRR; no murmur Lungs:  clear to auscultation bilaterally, no wheezing, rhonchi or rales Abd: soft, nontender, no hepatomegaly Ext: no edema Skin: warm and dry Neuro: no focal abnormalities noted  EKG:  Right bundle branch block     ASSESSMENT AND PLAN:  1. Syncope/orthostatic hypotension-seems to be doing quite well on low-dose Florinef. We will continue. I will check basic metabolic  profile to ensure proper sodium/potassium balance. Continue with conservative measures as well, compression hose, hydration.  Signed, Donato Schultz, MD [facility]  10/17/2013 9:44 AM

## 2013-10-17 NOTE — Patient Instructions (Signed)
Your physician recommends that you continue on your current medications as directed. Please refer to the Current Medication list given to you today.  Your physician wants you to follow-up in: 6 months with Dr. Skains. You will receive a reminder letter in the mail two months in advance. If you don't receive a letter, please call our office to schedule the follow-up appointment.  

## 2014-08-31 ENCOUNTER — Ambulatory Visit
Admission: RE | Admit: 2014-08-31 | Discharge: 2014-08-31 | Disposition: A | Discharge status: Home / Self Care | Payer: Medicare Other | Source: Ambulatory Visit | Attending: Family Medicine | Admitting: Family Medicine

## 2014-08-31 ENCOUNTER — Ambulatory Visit (HOSPITAL_COMMUNITY)
Admission: EM | Admit: 2014-08-31 | Discharge: 2014-09-01 | Disposition: A | Discharge status: Home / Self Care | Payer: Medicare Other | Source: Home / Self Care | Attending: Emergency Medicine | Admitting: Emergency Medicine

## 2014-08-31 ENCOUNTER — Encounter (HOSPITAL_COMMUNITY): Payer: Self-pay | Admitting: Emergency Medicine

## 2014-08-31 ENCOUNTER — Other Ambulatory Visit: Payer: Self-pay | Admitting: Family Medicine

## 2014-08-31 DIAGNOSIS — W19XXXA Unspecified fall, initial encounter: Secondary | ICD-10-CM

## 2014-08-31 DIAGNOSIS — W010XXA Fall on same level from slipping, tripping and stumbling without subsequent striking against object, initial encounter: Secondary | ICD-10-CM

## 2014-08-31 DIAGNOSIS — Z888 Allergy status to other drugs, medicaments and biological substances status: Secondary | ICD-10-CM

## 2014-08-31 DIAGNOSIS — E119 Type 2 diabetes mellitus without complications: Secondary | ICD-10-CM | POA: Insufficient documentation

## 2014-08-31 DIAGNOSIS — Z23 Encounter for immunization: Secondary | ICD-10-CM | POA: Insufficient documentation

## 2014-08-31 DIAGNOSIS — M25522 Pain in left elbow: Secondary | ICD-10-CM

## 2014-08-31 DIAGNOSIS — Z86711 Personal history of pulmonary embolism: Secondary | ICD-10-CM

## 2014-08-31 DIAGNOSIS — M1990 Unspecified osteoarthritis, unspecified site: Secondary | ICD-10-CM | POA: Diagnosis present

## 2014-08-31 DIAGNOSIS — Z8546 Personal history of malignant neoplasm of prostate: Secondary | ICD-10-CM

## 2014-08-31 DIAGNOSIS — Y92007 Garden or yard of unspecified non-institutional (private) residence as the place of occurrence of the external cause: Secondary | ICD-10-CM | POA: Insufficient documentation

## 2014-08-31 DIAGNOSIS — S06360A Traumatic hemorrhage of cerebrum, unspecified, without loss of consciousness, initial encounter: Secondary | ICD-10-CM

## 2014-08-31 DIAGNOSIS — F0781 Postconcussional syndrome: Principal | ICD-10-CM | POA: Diagnosis present

## 2014-08-31 DIAGNOSIS — S0990XA Unspecified injury of head, initial encounter: Secondary | ICD-10-CM

## 2014-08-31 DIAGNOSIS — Z87891 Personal history of nicotine dependence: Secondary | ICD-10-CM | POA: Insufficient documentation

## 2014-08-31 DIAGNOSIS — E785 Hyperlipidemia, unspecified: Secondary | ICD-10-CM | POA: Insufficient documentation

## 2014-08-31 DIAGNOSIS — Z79899 Other long term (current) drug therapy: Secondary | ICD-10-CM | POA: Insufficient documentation

## 2014-08-31 DIAGNOSIS — S060X9A Concussion with loss of consciousness of unspecified duration, initial encounter: Secondary | ICD-10-CM | POA: Diagnosis present

## 2014-08-31 DIAGNOSIS — Z86718 Personal history of other venous thrombosis and embolism: Secondary | ICD-10-CM

## 2014-08-31 DIAGNOSIS — Y9301 Activity, walking, marching and hiking: Secondary | ICD-10-CM

## 2014-08-31 DIAGNOSIS — Z7901 Long term (current) use of anticoagulants: Secondary | ICD-10-CM

## 2014-08-31 DIAGNOSIS — Z8249 Family history of ischemic heart disease and other diseases of the circulatory system: Secondary | ICD-10-CM

## 2014-08-31 DIAGNOSIS — Z96649 Presence of unspecified artificial hip joint: Secondary | ICD-10-CM | POA: Diagnosis present

## 2014-08-31 DIAGNOSIS — C61 Malignant neoplasm of prostate: Secondary | ICD-10-CM | POA: Diagnosis present

## 2014-08-31 DIAGNOSIS — I10 Essential (primary) hypertension: Secondary | ICD-10-CM | POA: Diagnosis present

## 2014-08-31 DIAGNOSIS — S06350A Traumatic hemorrhage of left cerebrum without loss of consciousness, initial encounter: Secondary | ICD-10-CM | POA: Insufficient documentation

## 2014-08-31 DIAGNOSIS — S06360D Traumatic hemorrhage of cerebrum, unspecified, without loss of consciousness, subsequent encounter: Secondary | ICD-10-CM

## 2014-08-31 DIAGNOSIS — G44319 Acute post-traumatic headache, not intractable: Secondary | ICD-10-CM | POA: Diagnosis present

## 2014-08-31 DIAGNOSIS — Z882 Allergy status to sulfonamides status: Secondary | ICD-10-CM

## 2014-08-31 DIAGNOSIS — I951 Orthostatic hypotension: Secondary | ICD-10-CM | POA: Diagnosis present

## 2014-08-31 DIAGNOSIS — S060XAA Concussion with loss of consciousness status unknown, initial encounter: Secondary | ICD-10-CM | POA: Diagnosis present

## 2014-08-31 LAB — CBC WITH DIFFERENTIAL/PLATELET
BASOS ABS: 0.1 10*3/uL (ref 0.0–0.1)
BASOS PCT: 1 % (ref 0–1)
EOS ABS: 0.1 10*3/uL (ref 0.0–0.7)
Eosinophils Relative: 2 % (ref 0–5)
HEMATOCRIT: 38.2 % — AB (ref 39.0–52.0)
Hemoglobin: 12.9 g/dL — ABNORMAL LOW (ref 13.0–17.0)
Lymphocytes Relative: 13 % (ref 12–46)
Lymphs Abs: 0.8 10*3/uL (ref 0.7–4.0)
MCH: 31.1 pg (ref 26.0–34.0)
MCHC: 33.8 g/dL (ref 30.0–36.0)
MCV: 92 fL (ref 78.0–100.0)
MONO ABS: 0.6 10*3/uL (ref 0.1–1.0)
Monocytes Relative: 10 % (ref 3–12)
NEUTROS ABS: 4.6 10*3/uL (ref 1.7–7.7)
NEUTROS PCT: 74 % (ref 43–77)
Platelets: 140 10*3/uL — ABNORMAL LOW (ref 150–400)
RBC: 4.15 MIL/uL — ABNORMAL LOW (ref 4.22–5.81)
RDW: 13.4 % (ref 11.5–15.5)
WBC: 6.1 10*3/uL (ref 4.0–10.5)

## 2014-08-31 LAB — COMPREHENSIVE METABOLIC PANEL
ALK PHOS: 92 U/L (ref 39–117)
ALT: 23 U/L (ref 0–53)
ANION GAP: 10 (ref 5–15)
AST: 26 U/L (ref 0–37)
Albumin: 3.6 g/dL (ref 3.5–5.2)
BILIRUBIN TOTAL: 1.5 mg/dL — AB (ref 0.3–1.2)
BUN: 13 mg/dL (ref 6–23)
CHLORIDE: 101 meq/L (ref 96–112)
CO2: 28 mEq/L (ref 19–32)
Calcium: 9.2 mg/dL (ref 8.4–10.5)
Creatinine, Ser: 0.99 mg/dL (ref 0.50–1.35)
GFR calc non Af Amer: 83 mL/min — ABNORMAL LOW (ref >90)
GLUCOSE: 153 mg/dL — AB (ref 70–99)
POTASSIUM: 3.9 meq/L (ref 3.7–5.3)
Sodium: 139 mEq/L (ref 137–147)
Total Protein: 6.6 g/dL (ref 6.0–8.3)

## 2014-08-31 LAB — I-STAT TROPONIN, ED: Troponin i, poc: 0.01 ng/mL (ref 0.00–0.08)

## 2014-08-31 LAB — TYPE AND SCREEN
ABO/RH(D): O POS
ANTIBODY SCREEN: NEGATIVE

## 2014-08-31 LAB — PROTIME-INR
INR: 1.75 — ABNORMAL HIGH (ref 0.00–1.49)
Prothrombin Time: 20.4 seconds — ABNORMAL HIGH (ref 11.6–15.2)

## 2014-08-31 LAB — PREPARE FRESH FROZEN PLASMA: UNIT DIVISION: 0

## 2014-08-31 LAB — ABO/RH: ABO/RH(D): O POS

## 2014-08-31 LAB — APTT: aPTT: 31 seconds (ref 24–37)

## 2014-08-31 LAB — PRO B NATRIURETIC PEPTIDE: Pro B Natriuretic peptide (BNP): 340.4 pg/mL — ABNORMAL HIGH (ref 0–125)

## 2014-08-31 MED ORDER — POTASSIUM CHLORIDE IN NACL 20-0.9 MEQ/L-% IV SOLN
INTRAVENOUS | Status: DC
  Administered 2014-08-31: 22:00:00 via INTRAVENOUS
  Filled 2014-08-31: qty 1000

## 2014-08-31 MED ORDER — SODIUM CHLORIDE 0.9 % IJ SOLN: 3.0000 mL | INTRAMUSCULAR | Status: DC | PRN

## 2014-08-31 MED ORDER — PHENOL 1.4 % MT LIQD: 1.0000 | OROMUCOSAL | Status: DC | PRN

## 2014-08-31 MED ORDER — TRAZODONE HCL 100 MG PO TABS
100.0000 mg | ORAL_TABLET | Freq: Every day | ORAL | Status: DC
  Filled 2014-08-31: qty 1

## 2014-08-31 MED ORDER — OXYCODONE-ACETAMINOPHEN 5-325 MG PO TABS
1.0000 | ORAL_TABLET | ORAL | Status: DC | PRN
  Administered 2014-08-31 – 2014-09-01 (×2): 2 via ORAL
  Filled 2014-08-31 (×2): qty 2

## 2014-08-31 MED ORDER — ACETAMINOPHEN 325 MG PO TABS
650.0000 mg | ORAL_TABLET | ORAL | Status: DC | PRN
  Administered 2014-09-01: 650 mg via ORAL
  Filled 2014-08-31: qty 2

## 2014-08-31 MED ORDER — MENTHOL 3 MG MT LOZG: 1.0000 | LOZENGE | OROMUCOSAL | Status: DC | PRN

## 2014-08-31 MED ORDER — LITHIUM CARBONATE 300 MG PO CAPS
300.0000 mg | ORAL_CAPSULE | Freq: Two times a day (BID) | ORAL | Status: DC
  Administered 2014-09-01: 300 mg via ORAL
  Filled 2014-08-31 (×3): qty 1

## 2014-08-31 MED ORDER — ACETAMINOPHEN 650 MG RE SUPP: 650.0000 mg | RECTAL | Status: DC | PRN

## 2014-08-31 MED ORDER — METFORMIN HCL 500 MG PO TABS
500.0000 mg | ORAL_TABLET | Freq: Every day | ORAL | Status: DC
Start: 2014-08-31 — End: 2014-09-01
  Filled 2014-08-31: qty 1

## 2014-08-31 MED ORDER — SODIUM CHLORIDE 0.9 % IJ SOLN
3.0000 mL | Freq: Two times a day (BID) | INTRAMUSCULAR | Status: DC
  Administered 2014-08-31: 3 mL via INTRAVENOUS

## 2014-08-31 MED ORDER — SODIUM CHLORIDE 0.9 % IV SOLN: 250.0000 mL | INTRAVENOUS | Status: DC

## 2014-08-31 MED ORDER — ONDANSETRON HCL 4 MG/2ML IJ SOLN
4.0000 mg | INTRAMUSCULAR | Status: DC | PRN
  Filled 2014-08-31: qty 2

## 2014-08-31 MED ORDER — MORPHINE SULFATE 2 MG/ML IJ SOLN
1.0000 mg | INTRAMUSCULAR | Status: DC | PRN
  Administered 2014-08-31: 2 mg via INTRAVENOUS
  Filled 2014-08-31: qty 1

## 2014-08-31 MED ORDER — PNEUMOCOCCAL VAC POLYVALENT 25 MCG/0.5ML IJ INJ
0.5000 mL | INJECTION | INTRAMUSCULAR | Status: AC
  Administered 2014-09-01: 0.5 mL via INTRAMUSCULAR
  Filled 2014-08-31: qty 0.5

## 2014-08-31 MED ORDER — SODIUM CHLORIDE 0.9 % IV SOLN
Freq: Once | INTRAVENOUS | Status: AC
  Administered 2014-08-31: 15:00:00 via INTRAVENOUS

## 2014-08-31 NOTE — ED Notes (Signed)
Per MD Order change no FFP at this point

## 2014-08-31 NOTE — ED Notes (Signed)
Paging neurosurgery about admission orders

## 2014-08-31 NOTE — ED Notes (Signed)
Patient's spouse Marguerita Merles to leave for awhile.  Updated phone number in chart.

## 2014-08-31 NOTE — ED Notes (Signed)
VSS, alert, NAD, calm, interactive, c/o HA, mentions blurred vision, denies other sx.

## 2014-08-31 NOTE — ED Provider Notes (Signed)
CSN: [id number]     Arrival date & time 08/31/14  1235 History   None    Chief Complaint  Patient presents with  . Head Injury    ICH s/p fall 4 days ago     (Consider location/radiation/quality/duration/timing/severity/associated sxs/prior Treatment) HPI [name] Hall is a 68 year old male with past medical history of DVT, PE, hyperlipidemia, diabetes, hypertension who presents the ED by EMS after being diagnosed with ICH by outpatient imaging center. Patient reports he was walking outside at his house and numbness 4 days ago, "slipped" and fell, hitting his right arm and head. Patient states he then rolled down a hill. Patient states he denies any loss of consciousness, and is able to recall the entire event. Patient was sent to a outpatient imaging center where he had a CT head, neck and x-ray of right arm. CT head was noted to have Foster at that time, and imaging center called EMS due to being unable to reach patient's PCP who ordered the tests. Patient denies any weakness, headache, nausea, vomiting, blurred vision, dizziness, syncope. Past Medical History  Diagnosis Date  . Diabetes mellitus   . DVT (deep venous thrombosis)   . PE (pulmonary embolism)   . Prostate ca   . Orthostatic hypotension   . PROSTATE CANCER   . PERSONAL HISTORY, VENOUS THROMBOSIS AND EMBOLISM   . Hypertension   . Hyperlipidemia   . Osteoarthritis    Past Surgical History  Procedure Laterality Date  . Total hip arthroplasty    . Tonsillectomy and adenoidectomy      AS A CHILD  . Colonoscopy with propofol  10/2003   Family History  Problem Relation Age of Onset  . Heart attack Father   . Depression Sister   . Hyperlipidemia Mother   . Hyperlipidemia Father   . Hypertension Mother   . Hypertension Father   . Sudden death Father    History  Substance Use Topics  . Smoking status: Former Smoker -- 1.00 packs/day for 30 years    Types: Cigarettes  . Smokeless tobacco: Never Used  . Alcohol Use: Yes      Comment: occasionally    Review of Systems  Constitutional: Negative for fever.  HENT: Negative for trouble swallowing.   Eyes: Negative for visual disturbance.  Respiratory: Negative for shortness of breath.   Cardiovascular: Negative for chest pain.  Gastrointestinal: Negative for nausea, vomiting and abdominal pain.  Genitourinary: Negative for dysuria.  Musculoskeletal: Negative for neck pain.  Skin: Negative for rash.  Neurological: Negative for dizziness, weakness, numbness and headaches.  Psychiatric/Behavioral: Negative.       Allergies  Altace and Sulfonamide derivatives  Home Medications   Prior to Admission medications   Medication Sig Start Date End Date Taking? Authorizing Provider  atorvastatin (LIPITOR) 80 MG tablet Take 80 mg by mouth at bedtime.   Yes Historical Provider, MD  lithium carbonate 300 MG capsule Take 300 mg by mouth 2 (two) times daily.   Yes Historical Provider, MD  metFORMIN (GLUCOPHAGE) 500 MG tablet Take 500 mg by mouth at bedtime.   Yes Historical Provider, MD  traZODone (DESYREL) 100 MG tablet Take 100 mg by mouth at bedtime.   Yes Historical Provider, MD  warfarin (COUMADIN) 5 MG tablet Take 5-10 mg by mouth daily. Tuesday and Friday take 2 tablets (10 mg) all other days take 1 tablet (5 mg)   Yes Historical Provider, MD   BP 100/84  Pulse 74  Temp(Src) 99.8 F (37.7  C) (Oral)  Resp 17  Ht 6' (1.829 m)  Wt 197 lb (89.359 kg)  BMI 26.71 kg/m2  SpO2 99% Physical Exam  Constitutional: He appears well-developed and well-nourished. No distress.  HENT:  Head: Normocephalic and atraumatic.  Mouth/Throat: Oropharynx is clear and moist. No oropharyngeal exudate.  Eyes: Right eye exhibits no discharge. Left eye exhibits no discharge. No scleral icterus.  Neck: Normal range of motion.  Cardiovascular: Normal rate, regular rhythm and normal heart sounds.   No murmur heard. Pulmonary/Chest: Effort normal and breath sounds normal. No  respiratory distress.  Abdominal: Soft. There is no tenderness.  Musculoskeletal: Normal range of motion. He exhibits no edema and no tenderness.  Neurological: He is alert. He has normal strength. He is not disoriented. No cranial nerve deficit or sensory deficit. Coordination normal.  Patient fully alert answering questions appropriately in full, clear sentences. Motor strength 5 out of 5 in all major muscle groups of upper and lower extremities. Coordination normal finger-to-nose test.  Skin: Skin is warm and dry. No rash noted. He is not diaphoretic.  Psychiatric: He has a normal mood and affect.    ED Course  Procedures (including critical care time) Labs Review Labs Reviewed  PRO B NATRIURETIC PEPTIDE - Abnormal; Notable for the following:    Pro B Natriuretic peptide (BNP) 340.4 (*)    All other components within normal limits  PROTIME-INR - Abnormal; Notable for the following:    Prothrombin Time 20.4 (*)    INR 1.75 (*)    All other components within normal limits  COMPREHENSIVE METABOLIC PANEL - Abnormal; Notable for the following:    Glucose, Bld 153 (*)    Total Bilirubin 1.5 (*)    GFR calc non Af Amer 83 (*)    All other components within normal limits  CBC WITH DIFFERENTIAL - Abnormal; Notable for the following:    RBC 4.15 (*)    Hemoglobin 12.9 (*)    HCT 38.2 (*)    Platelets 140 (*)    All other components within normal limits  APTT  I-STAT TROPOININ, ED  TYPE AND SCREEN  PREPARE FRESH FROZEN PLASMA  ABO/RH    Imaging Review Dg Elbow Complete Left  08/31/2014   CLINICAL DATA:  Fall on wet timber while hiking 4 days prior, landing on elbow  EXAM: LEFT ELBOW - COMPLETE 3+ VIEW  COMPARISON:  None.  FINDINGS: Frontal, lateral, and bilateral oblique views were obtained. There is no apparent fracture, dislocation, or effusion. There is moderate generalized osteoarthritic change. No erosive change.  IMPRESSION: Generalized osteoarthritic change. No fracture,  dislocation, or effusion.   Electronically Signed   By: Lowella Grip M.D.   On: 08/31/2014 11:35   Ct Head Wo Contrast  08/31/2014   CLINICAL DATA:  Headache following fall 4 days prior. Patient takes Coumadin  EXAM: CT HEAD WITHOUT CONTRAST  TECHNIQUE: Contiguous axial images were obtained from the base of the skull through the vertex without intravenous contrast.  COMPARISON:  Apr 13, 2012  FINDINGS: There is mild diffuse atrophy. There is a hemorrhagic focus in the posterior aspect of the left temporal lobe superiorly measuring 2.5 x 1.6 cm with mild surrounding edema. No other acute hemorrhage is seen. This hemorrhagic focus is not causing significant mass effect.  There are subdural hygromas in each frontal lobe region causing modest impression on the respective frontal lobes. The subdural hygroma on the right measures 7 mm in thickness. The subdural hygroma on the left  measures 5 mm in thickness. No acute hemorrhage is seen in either of these areas.  There is no midline shift. There is mild small vessel disease in the centra semiovale. 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  Bony calvarium appears intact.  The mastoid air cells are clear.  IMPRESSION: Focal area of hemorrhage with mild surrounding edema in the posterior left temporal lobe measuring 2.5 x 1.6 cm.  Nonacute appearing small subdural hygromas in each frontal region.  Mild periventricular small vessel disease.  Attempts at this time to reach the patient's referring physician were unsuccessful. I spoke directly to the patient, informing him that he needed to be transported by ambulance to the nearest emergency department for further evaluation. The patient agreed to this plan, and arrangements were made for ambulance transport from the imaging center office to the nearest emergency department. Further attempts will be made to contact the patient's referring physician about these findings and the patient's  disposition.   Electronically Signed   By: Lowella Grip M.D.   On: 08/31/2014 12:12     EKG Interpretation None      MDM   Final diagnoses:  None    [place], "slipped" and fell, hitting his right arm and head. Patient seen for imaging today at outpatient imaging clinic, diagnosed with ICH and sent to the ER by EMS. Per EMS patient has been alert and oriented denying any complaints. Patient's neuro exam benign, stroke scale negative. Patient on Coumadin with last INR 1.8. Patient states he has been compliant with Coumadin. Patient asymptomatic in the ED, with no neurologic deficits noted on exam.   Neurosurgery consult and agreed to admit patient.The patient appears reasonably stabilized for admission considering the current resources, flow, and capabilities available in the ED at this time, and I doubt any other [facility] requiring further screening and/or treatment in the ED prior to admission.  Signed,  Dahlia Bailiff, PA-C 6:41 PM  This patient seen and discussed with Dr. Artis Delay, MD  [name] Mew, PA-C 08/31/14 [phone]

## 2014-08-31 NOTE — ED Notes (Signed)
Per EMS: patient fell 4 days ago, did not seek healthcare at that time, on coumadin.  Wife states patient just wasn't acting right, patient drove from [place] to here on Tuesday and to his PCP today, had CT scan done and xray of left elbow.  CT scan showed intracerebral hemorrhage.  Patient sent here via [facility].  VSS en route, A&Ox4, PERRLA, GCS 15,  No focal neural deficits noted.

## 2014-08-31 NOTE — ED Notes (Signed)
Pt given [place] and [employer]. Per RN.

## 2014-08-31 NOTE — ED Notes (Signed)
Dr. Wofford at bedside 

## 2014-08-31 NOTE — ED Notes (Addendum)
Neuro surgery at bedside.

## 2014-08-31 NOTE — ED Notes (Signed)
MD at bedside. 

## 2014-08-31 NOTE — H&P (Signed)
Reason for Consult: Closed head injury Referring Physician: EDP  [name] Hall is an 68 y.o. male.   HPI:  68 year old [place] when he slipped on a railroad tie and fell and hit the left side of his head. No loss of consciousness. He has had some progressive headache since that time. No numbness tingling or weakness or seizure activity. Mild blurring of vision when he reads. No nausea and vomiting. He is on Coumadin for history of DVT and PE 4 years ago. Headache is moderate and left-sided. Presented to the emergency department today where CT scan showed a left posterior temporal contusion and neurosurgical evaluation was requested.  Past Medical History  Diagnosis Date  . Diabetes mellitus   . DVT (deep venous thrombosis)   . PE (pulmonary embolism)   . Prostate ca   . Orthostatic hypotension   . PROSTATE CANCER   . PERSONAL HISTORY, VENOUS THROMBOSIS AND EMBOLISM   . Hypertension   . Hyperlipidemia   . Osteoarthritis     Past Surgical History  Procedure Laterality Date  . Total hip arthroplasty    . Tonsillectomy and adenoidectomy      AS A CHILD  . Colonoscopy with propofol  10/2003    Allergies  Allergen Reactions  . Altace [Ramipril]     HIVES   . Sulfonamide Derivatives     REACTION: itching    History  Substance Use Topics  . Smoking status: Former Smoker -- 1.00 packs/day for 30 years    Types: Cigarettes  . Smokeless tobacco: Never Used  . Alcohol Use: Yes     Comment: occasionally    Family History  Problem Relation Age of Onset  . Heart attack Father   . Depression Sister   . Hyperlipidemia Mother   . Hyperlipidemia Father   . Hypertension Mother   . Hypertension Father   . Sudden death Father      Review of Systems  Positive ROS: Negative  All other systems have been reviewed and were otherwise negative with the exception of those mentioned in the HPI and as above.  Objective: Vital signs in last 24  hours: Temp:  [98.9 F (37.2 C)-99.8 F (37.7 C)] 99.8 F (37.7 C) (10/02 1256) Pulse Rate:  [67-81] 69 (10/02 1613) Resp:  [11-21] 21 (10/02 1613) BP: (108-132)/(67-79) 132/74 mmHg (10/02 1430) SpO2:  [95 %-100 %] 96 % (10/02 1613) Weight:  [89.359 kg (197 lb)] 89.359 kg (197 lb) (10/02 1256)  General Appearance: Alert, cooperative, no distress, appears stated age Head: Normocephalic, without obvious abnormality, abrasion and swelling to the left temporal region Eyes: PERRL, conjunctiva/corneas clear, EOM's intact   Throat: benign Neck: Supple, symmetrical, trachea midline,  Lungs:  respirations unlabored Heart: Regular rate and rhythm   NEUROLOGIC:   Mental status: A&O x4, no aphasia, good attention span, Memory and fund of knowledge Motor Exam - grossly normal, normal tone and bulk, no pronator drift Sensory Exam - grossly normal Reflexes: symmetric, no pathologic reflexes, No Hoffman's, No clonus Coordination - grossly normal Gait - not tested Balance -not tested Cranial Nerves: I: smell Not tested  II: visual acuity  OS: na    OD: na  II: visual fields Full to confrontation  II: pupils Equal, round, reactive to light  III,VII: ptosis None  III,IV,VI: extraocular muscles  Full ROM  V: mastication Normal  V: facial light touch sensation  Normal  V,VII: corneal reflex  Present  VII:  facial muscle function - upper  Normal  VII: facial muscle function - lower Normal  VIII: hearing Not tested  IX: soft palate elevation  Normal  IX,X: gag reflex Present  XI: trapezius strength  5/5  XI: sternocleidomastoid strength 5/5  XI: neck flexion strength  5/5  XII: tongue strength  Normal    Data Review Lab Results  Component Value Date   WBC 6.1 08/31/2014   HGB 12.9* 08/31/2014   HCT 38.2* 08/31/2014   MCV 92.0 08/31/2014   PLT 140* 08/31/2014   Lab Results  Component Value Date   NA 139 08/31/2014   K 3.9 08/31/2014   CL 101 08/31/2014   CO2 28 08/31/2014   BUN 13  08/31/2014   CREATININE 0.99 08/31/2014   GLUCOSE 153* 08/31/2014   Lab Results  Component Value Date   INR 1.75* 08/31/2014    Radiology: Dg Elbow Complete Left  08/31/2014   CLINICAL DATA:  Fall on wet timber while hiking 4 days prior, landing on elbow  EXAM: LEFT ELBOW - COMPLETE 3+ VIEW  COMPARISON:  None.  FINDINGS: Frontal, lateral, and bilateral oblique views were obtained. There is no apparent fracture, dislocation, or effusion. There is moderate generalized osteoarthritic change. No erosive change.  IMPRESSION: Generalized osteoarthritic change. No fracture, dislocation, or effusion.   Electronically Signed   By: Lowella Grip M.D.   On: 08/31/2014 11:35   Ct Head Wo Contrast  08/31/2014   CLINICAL DATA:  Headache following fall 4 days prior. Patient takes Coumadin  EXAM: CT HEAD WITHOUT CONTRAST  TECHNIQUE: Contiguous axial images were obtained from the base of the skull through the vertex without intravenous contrast.  COMPARISON:  Apr 13, 2012  FINDINGS: There is mild diffuse atrophy. There is a hemorrhagic focus in the posterior aspect of the left temporal lobe superiorly measuring 2.5 x 1.6 cm with mild surrounding edema. No other acute hemorrhage is seen. This hemorrhagic focus is not causing significant mass effect.  There are subdural hygromas in each frontal lobe region causing modest impression on the respective frontal lobes. The subdural hygroma on the right measures 7 mm in thickness. The subdural hygroma on the left measures 5 mm in thickness. No acute hemorrhage is seen in either of these areas.  There is no midline shift. There is mild small vessel disease in the centra semiovale. 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  Bony calvarium appears intact.  The mastoid air cells are clear.  IMPRESSION: Focal area of hemorrhage with mild surrounding edema in the posterior left temporal lobe measuring 2.5 x 1.6 cm.  Nonacute appearing small subdural  hygromas in each frontal region.  Mild periventricular small vessel disease.  Attempts at this time to reach the patient's referring physician were unsuccessful. I spoke directly to the patient, informing him that he needed to be transported by ambulance to the nearest emergency department for further evaluation. The patient agreed to this plan, and arrangements were made for ambulance transport from the imaging center office to the nearest emergency department. Further attempts will be made to contact the patient's referring physician about these findings and the patient's disposition.   Electronically Signed   By: Lowella Grip M.D.   On: 08/31/2014 12:12     Assessment/Plan:   68 year old gentleman on Coumadin for history of DVT and PE [place] who fell 4 days ago and suffered a mild closed head injury. I think his symptoms are  likely more from a postconcussive syndrome down from the small posterior left temporal contusion. There is some surrounding edema but really minimal mass effect and no shift. I don't believe this represents a underlying lesion. I think this is likely a contusion given his history. I do not believe that he fell because of the lesion but instead the contusion is secondary to the fall. His INR is 1.75. He has not taken Coumadin since last night. We will hold the Coumadin for at least another day, and potentially longer. However since he is been on that for 4 days after the injury and he is not had a bleeding complications secondary to that, I think the likelihood of clinically significant increase in size of the hemorrhage because of the continued use of Coumadin is quite small. Also, the likelihood of development of DVT and PE from being off the Coumadin for 3 or 4 days is also quite small. He will be admitted for observation. Likely discharge tomorrow.   , S 08/31/2014 5:08 PM

## 2014-09-01 ENCOUNTER — Ambulatory Visit (HOSPITAL_COMMUNITY): Payer: Medicare Other

## 2014-09-01 DIAGNOSIS — Z86711 Personal history of pulmonary embolism: Secondary | ICD-10-CM | POA: Diagnosis not present

## 2014-09-01 DIAGNOSIS — Y9301 Activity, walking, marching and hiking: Secondary | ICD-10-CM | POA: Diagnosis not present

## 2014-09-01 DIAGNOSIS — M1990 Unspecified osteoarthritis, unspecified site: Secondary | ICD-10-CM | POA: Diagnosis not present

## 2014-09-01 DIAGNOSIS — I951 Orthostatic hypotension: Secondary | ICD-10-CM | POA: Diagnosis not present

## 2014-09-01 DIAGNOSIS — Z86718 Personal history of other venous thrombosis and embolism: Secondary | ICD-10-CM | POA: Diagnosis not present

## 2014-09-01 DIAGNOSIS — Z23 Encounter for immunization: Secondary | ICD-10-CM | POA: Diagnosis not present

## 2014-09-01 DIAGNOSIS — E119 Type 2 diabetes mellitus without complications: Secondary | ICD-10-CM | POA: Diagnosis not present

## 2014-09-01 DIAGNOSIS — I10 Essential (primary) hypertension: Secondary | ICD-10-CM | POA: Diagnosis not present

## 2014-09-01 DIAGNOSIS — Z87891 Personal history of nicotine dependence: Secondary | ICD-10-CM | POA: Diagnosis not present

## 2014-09-01 DIAGNOSIS — Y92007 Garden or yard of unspecified non-institutional (private) residence as the place of occurrence of the external cause: Secondary | ICD-10-CM | POA: Diagnosis not present

## 2014-09-01 DIAGNOSIS — Z8546 Personal history of malignant neoplasm of prostate: Secondary | ICD-10-CM | POA: Diagnosis not present

## 2014-09-01 DIAGNOSIS — C61 Malignant neoplasm of prostate: Secondary | ICD-10-CM | POA: Diagnosis not present

## 2014-09-01 DIAGNOSIS — E785 Hyperlipidemia, unspecified: Secondary | ICD-10-CM | POA: Diagnosis not present

## 2014-09-01 DIAGNOSIS — W010XXA Fall on same level from slipping, tripping and stumbling without subsequent striking against object, initial encounter: Secondary | ICD-10-CM | POA: Diagnosis not present

## 2014-09-01 DIAGNOSIS — Z7901 Long term (current) use of anticoagulants: Secondary | ICD-10-CM | POA: Diagnosis not present

## 2014-09-01 DIAGNOSIS — Z96649 Presence of unspecified artificial hip joint: Secondary | ICD-10-CM | POA: Diagnosis not present

## 2014-09-01 DIAGNOSIS — F0781 Postconcussional syndrome: Secondary | ICD-10-CM | POA: Diagnosis not present

## 2014-09-01 DIAGNOSIS — Z79899 Other long term (current) drug therapy: Secondary | ICD-10-CM | POA: Diagnosis not present

## 2014-09-01 DIAGNOSIS — Z888 Allergy status to other drugs, medicaments and biological substances status: Secondary | ICD-10-CM | POA: Diagnosis not present

## 2014-09-01 DIAGNOSIS — G44319 Acute post-traumatic headache, not intractable: Secondary | ICD-10-CM | POA: Diagnosis not present

## 2014-09-01 DIAGNOSIS — Z882 Allergy status to sulfonamides status: Secondary | ICD-10-CM | POA: Diagnosis not present

## 2014-09-01 DIAGNOSIS — Z8249 Family history of ischemic heart disease and other diseases of the circulatory system: Secondary | ICD-10-CM | POA: Diagnosis not present

## 2014-09-01 DIAGNOSIS — S06360D Traumatic hemorrhage of cerebrum, unspecified, without loss of consciousness, subsequent encounter: Secondary | ICD-10-CM | POA: Diagnosis not present

## 2014-09-01 LAB — GLUCOSE, CAPILLARY
GLUCOSE-CAPILLARY: 131 mg/dL — AB (ref 70–99)
Glucose-Capillary: 137 mg/dL — ABNORMAL HIGH (ref 70–99)

## 2014-09-01 MED ORDER — OXYCODONE-ACETAMINOPHEN 5-325 MG PO TABS: 1.0000 | ORAL_TABLET | ORAL | Status: DC | PRN

## 2014-09-01 NOTE — ED Provider Notes (Addendum)
Medical screening examination/treatment/procedure(s) were conducted as a shared visit with non-physician practitioner(s) and myself.  I personally evaluated the patient during the encounter.   EKG Interpretation   Date/Time:  Friday August 31 2014 12:42:40 EDT Ventricular Rate:  76 PR Interval:  169 QRS Duration: 145 QT Interval:  424 QTC Calculation: 477 R Axis:   40 Text Interpretation:  Age not entered, assumed to be  68 years old for  purpose of ECG interpretation Sinus rhythm Right bundle branch block  Baseline wander in lead(s) II III aVL aVF No significant change was found  Confirmed by [facility]  MD, TREY ([id number]) on 09/01/2014 7:28:01 AM      CRITICAL CARE Performed by: Serita Grit DAVID III   Total critical care time: 35  Critical care time was exclusive of separately billable procedures and treating other patients.  Critical care was necessary to treat or prevent imminent or life-threatening deterioration.  Critical care was time spent personally by me on the following activities: development of treatment plan with patient and/or surrogate as well as nursing, discussions with consultants, evaluation of patient's response to treatment, examination of patient, obtaining history from patient or surrogate, ordering and performing treatments and interventions, ordering and review of laboratory studies, ordering and review of radiographic studies, pulse oximetry and re-evaluation of patient's condition.   68 yo male who slipped and fell four days ago who presents with worsening headache and malaise.  He reported that his wife has noticed him walking abnormally.  He went to his primary doctor earlier today and had a head CT which showed intracranial hemorrhage.  He subsequently presented to the ED.  On exam, well appearing, nontoxic, not distressed, normal respiratory effort, normal perfusion, alert, oriented, normal grip strength, normal gait.  He takes coumadin for DVT/PE.  I  ordered FFP initially, but after discussion with Dr. Ronnald Ramp ([facility]), we held off on this transfusion.  Dr. Ronnald Ramp will observe in the hospital overnight.    Clinical Impression: 1. Intracranial hemorrhage following injury, unspecified laterality, without loss of consciousness, initial encounter       Artis Delay, MD 09/01/14 [place], MD 09/01/14 [phone]

## 2014-09-01 NOTE — ED Provider Notes (Signed)
Medical screening examination/treatment/procedure(s) were conducted as a shared visit with non-physician practitioner(s) and myself.  I personally evaluated the patient during the encounter.   EKG Interpretation   Date/Time:  Friday August 31 2014 12:42:40 EDT Ventricular Rate:  76 PR Interval:  169 QRS Duration: 145 QT Interval:  424 QTC Calculation: 477 R Axis:   40 Text Interpretation:  Age not entered, assumed to be  68 years old for  purpose of ECG interpretation Sinus rhythm Right bundle branch block  Baseline wander in lead(s) II III aVL aVF No significant change was found  Confirmed by [facility]  MD, TREY ([id number]) on 09/01/2014 7:28:01 AM        Artis Delay, MD 09/01/14 [phone]

## 2014-09-01 NOTE — Progress Notes (Signed)
Patient transferred to 4N10, A/O X4. Patient oriented to room and equipment. Initial assessment completed. C/o headache of 5/10. Pain relief measures implemented. No c/o blurred vision at this time. Will continue to monitor.

## 2014-09-01 NOTE — Progress Notes (Signed)
Patient ID: [name] Hall, male   DOB: September 07, 1946, 68 y.o.   MRN: [id number] Subjective: Patient reports worsening headache without visual changes or new N/T/W. No N/V/   Objective: Vital signs in last 24 hours: Temp:  [97.8 F (36.6 C)-99.8 F (37.7 C)] 97.8 F (36.6 C) (10/03 0550) Pulse Rate:  [67-89] 78 (10/03 0620) Resp:  [11-21] 18 (10/03 0620) BP: (96-161)/(56-84) 97/56 mmHg (10/03 0620) SpO2:  [93 %-100 %] 93 % (10/03 0620) Weight:  [89.359 kg (197 lb)] 89.359 kg (197 lb) (10/02 1256)  Intake/Output from previous day:   Intake/Output this shift:    Neurologic: Grossly normal, awake/alert, pleasant conversant, reading the newspaper, MAEx4  Lab Results: Lab Results  Component Value Date   WBC 6.1 08/31/2014   HGB 12.9* 08/31/2014   HCT 38.2* 08/31/2014   MCV 92.0 08/31/2014   PLT 140* 08/31/2014   Lab Results  Component Value Date   INR 1.75* 08/31/2014   BMET Lab Results  Component Value Date   NA 139 08/31/2014   K 3.9 08/31/2014   CL 101 08/31/2014   CO2 28 08/31/2014   GLUCOSE 153* 08/31/2014   BUN 13 08/31/2014   CREATININE 0.99 08/31/2014   CALCIUM 9.2 08/31/2014    Studies/Results: Dg Elbow Complete Left  08/31/2014   CLINICAL DATA:  Fall on wet timber while hiking 4 days prior, landing on elbow  EXAM: LEFT ELBOW - COMPLETE 3+ VIEW  COMPARISON:  None.  FINDINGS: Frontal, lateral, and bilateral oblique views were obtained. There is no apparent fracture, dislocation, or effusion. There is moderate generalized osteoarthritic change. No erosive change.  IMPRESSION: Generalized osteoarthritic change. No fracture, dislocation, or effusion.   Electronically Signed   By: Lowella Grip M.D.   On: 08/31/2014 11:35   Ct Head Wo Contrast  08/31/2014   CLINICAL DATA:  Headache following fall 4 days prior. Patient takes Coumadin  EXAM: CT HEAD WITHOUT CONTRAST  TECHNIQUE: Contiguous axial images were obtained from the base of the skull through the vertex without intravenous  contrast.  COMPARISON:  Apr 13, 2012  FINDINGS: There is mild diffuse atrophy. There is a hemorrhagic focus in the posterior aspect of the left temporal lobe superiorly measuring 2.5 x 1.6 cm with mild surrounding edema. No other acute hemorrhage is seen. This hemorrhagic focus is not causing significant mass effect.  There are subdural hygromas in each frontal lobe region causing modest impression on the respective frontal lobes. The subdural hygroma on the right measures 7 mm in thickness. The subdural hygroma on the left measures 5 mm in thickness. No acute hemorrhage is seen in either of these areas.  There is no midline shift. There is mild small vessel disease in the centra semiovale. 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  Bony calvarium appears intact.  The mastoid air cells are clear.  IMPRESSION: Focal area of hemorrhage with mild surrounding edema in the posterior left temporal lobe measuring 2.5 x 1.6 cm.  Nonacute appearing small subdural hygromas in each frontal region.  Mild periventricular small vessel disease.  Attempts at this time to reach the patient's referring physician were unsuccessful. I spoke directly to the patient, informing him that he needed to be transported by ambulance to the nearest emergency department for further evaluation. The patient agreed to this plan, and arrangements were made for ambulance transport from the imaging center office to the nearest emergency department. Further attempts will be made to contact the patient's  referring physician about these findings and the patient's disposition.   Electronically Signed   By: Lowella Grip M.D.   On: 08/31/2014 12:12    Assessment/Plan: Repeat head CT, hold coumadin   LOS: 1 day    , S 09/01/2014, 8:04 AM

## 2014-09-01 NOTE — Discharge Summary (Signed)
Physician Discharge Summary  Patient ID: [name] Hall MRN: [id number] DOB/AGE: Jan 15, 1946 68 y.o.  Admit date: 08/31/2014 Discharge date: 09/01/2014  Admission Diagnoses: [place]    Discharge Diagnoses: same   Discharged Condition: good  Hospital Course: The patient was admitted on 08/31/2014 with [facility] after a fall. Coumadin held. He was admitted to the floor in stable condition. The hospital course was routine. There were no complications. Pt had appropriate headache. No complaints of  new N/T/W. Repeat CT of head was stable. The patient remained afebrile with stable vital signs, and tolerated a regular diet. The patient continued to increase activities, and pain was well controlled with oral pain medications. Will hold coumadin for 1 week.  Consults: none  Significant Diagnostic Studies:  Results for orders placed during the hospital encounter of 08/31/14  PRO B NATRIURETIC PEPTIDE      Result Value Ref Range   Pro B Natriuretic peptide (BNP) 340.4 (*) 0 - 125 pg/mL  PROTIME-INR      Result Value Ref Range   Prothrombin Time 20.4 (*) 11.6 - 15.2 seconds   INR 1.75 (*) 0.00 - 1.49  COMPREHENSIVE METABOLIC PANEL      Result Value Ref Range   Sodium 139  137 - 147 mEq/L   Potassium 3.9  3.7 - 5.3 mEq/L   Chloride 101  96 - 112 mEq/L   CO2 28  19 - 32 mEq/L   Glucose, Bld 153 (*) 70 - 99 mg/dL   BUN 13  6 - 23 mg/dL   Creatinine, Ser 0.99  0.50 - 1.35 mg/dL   Calcium 9.2  8.4 - 10.5 mg/dL   Total Protein 6.6  6.0 - 8.3 g/dL   Albumin 3.6  3.5 - 5.2 g/dL   AST 26  0 - 37 U/L   ALT 23  0 - 53 U/L   Alkaline Phosphatase 92  39 - 117 U/L   Total Bilirubin 1.5 (*) 0.3 - 1.2 mg/dL   GFR calc non Af Amer 83 (*) >90 mL/min   GFR calc Af Amer >90  >90 mL/min   Anion gap 10  5 - 15  CBC WITH DIFFERENTIAL      Result Value Ref Range   WBC 6.1  4.0 - 10.5 K/uL   RBC 4.15 (*) 4.22 - 5.81 MIL/uL   Hemoglobin 12.9 (*) 13.0 - 17.0 g/dL   HCT 38.2 (*) 39.0 - 52.0 %   MCV 92.0  78.0 -  100.0 fL   MCH 31.1  26.0 - 34.0 pg   MCHC 33.8  30.0 - 36.0 g/dL   RDW 13.4  11.5 - 15.5 %   Platelets 140 (*) 150 - 400 K/uL   Neutrophils Relative % 74  43 - 77 %   Neutro Abs 4.6  1.7 - 7.7 K/uL   Lymphocytes Relative 13  12 - 46 %   Lymphs Abs 0.8  0.7 - 4.0 K/uL   Monocytes Relative 10  3 - 12 %   Monocytes Absolute 0.6  0.1 - 1.0 K/uL   Eosinophils Relative 2  0 - 5 %   Eosinophils Absolute 0.1  0.0 - 0.7 K/uL   Basophils Relative 1  0 - 1 %   Basophils Absolute 0.1  0.0 - 0.1 K/uL  APTT      Result Value Ref Range   aPTT 31  24 - 37 seconds  GLUCOSE, CAPILLARY      Result Value Ref Range   Glucose-Capillary 131 (*)  70 - 99 mg/dL   Comment 1 Documented in Chart     Comment 2 Notify RN    I-STAT TROPOININ, ED      Result Value Ref Range   Troponin i, poc 0.01  0.00 - 0.08 ng/mL   Comment 3           TYPE AND SCREEN      Result Value Ref Range   ABO/RH(D) O POS     Antibody Screen NEG     Sample Expiration 09/03/2014    PREPARE FRESH FROZEN PLASMA      Result Value Ref Range   Unit Number [id number]     Blood Component Type THAWED PLASMA     Unit division 00     Status of Unit REL FROM [facility]     Transfusion Status OK TO TRANSFUSE    ABO/RH      Result Value Ref Range   ABO/RH(D) O POS      Dg Elbow Complete Left  08/31/2014   CLINICAL DATA:  Fall on wet timber while hiking 4 days prior, landing on elbow  EXAM: LEFT ELBOW - COMPLETE 3+ VIEW  COMPARISON:  None.  FINDINGS: Frontal, lateral, and bilateral oblique views were obtained. There is no apparent fracture, dislocation, or effusion. There is moderate generalized osteoarthritic change. No erosive change.  IMPRESSION: Generalized osteoarthritic change. No fracture, dislocation, or effusion.   Electronically Signed   By: Lowella Grip M.D.   On: 08/31/2014 11:35   Ct Head Wo Contrast  09/01/2014   CLINICAL DATA:  Follow-up intracranial hemorrhage. Worsening headache.  EXAM: CT HEAD WITHOUT CONTRAST   TECHNIQUE: Contiguous axial images were obtained from the base of the skull through the vertex without intravenous contrast.  COMPARISON:  08/31/2014  FINDINGS: Acute parenchymal hemorrhage in the posterior left temporal lobe does not appear significantly changed in size, measuring approximately 2.2 x 1.7 cm. Mild surrounding vasogenic edema does not appear significantly changed. There is no evidence of new intracranial hemorrhage. Small low density subdural collections overlying both frontal lobes do not appear significantly changed and are compatible with small subdural hygromas. 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 Ventricles are normal for age. There is no midline shift.  Orbits are unremarkable. Paranasal sinuses and mastoid air cells are clear. Moderate carotid siphon calcification is noted.  IMPRESSION: Unchanged posterior left temporal parenchymal hematoma and mild surrounding edema. No new hemorrhage or midline shift.   Electronically Signed   By: Logan Bores   On: 09/01/2014 09:42   Ct Head Wo Contrast  08/31/2014   CLINICAL DATA:  Headache following fall 4 days prior. Patient takes Coumadin  EXAM: CT HEAD WITHOUT CONTRAST  TECHNIQUE: Contiguous axial images were obtained from the base of the skull through the vertex without intravenous contrast.  COMPARISON:  Apr 13, 2012  FINDINGS: There is mild diffuse atrophy. There is a hemorrhagic focus in the posterior aspect of the left temporal lobe superiorly measuring 2.5 x 1.6 cm with mild surrounding edema. No other acute hemorrhage is seen. This hemorrhagic focus is not causing significant mass effect.  There are subdural hygromas in each frontal lobe region causing modest impression on the respective frontal lobes. The subdural hygroma on the right measures 7 mm in thickness. The subdural hygroma on the left measures 5 mm in thickness. No acute hemorrhage is seen in either of these areas.  There is no midline shift. There  is mild small vessel disease  in the centra semiovale. 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  Bony calvarium appears intact.  The mastoid air cells are clear.  IMPRESSION: Focal area of hemorrhage with mild surrounding edema in the posterior left temporal lobe measuring 2.5 x 1.6 cm.  Nonacute appearing small subdural hygromas in each frontal region.  Mild periventricular small vessel disease.  Attempts at this time to reach the patient's referring physician were unsuccessful. I spoke directly to the patient, informing him that he needed to be transported by ambulance to the nearest emergency department for further evaluation. The patient agreed to this plan, and arrangements were made for ambulance transport from the imaging center office to the nearest emergency department. Further attempts will be made to contact the patient's referring physician about these findings and the patient's disposition.   Electronically Signed   By: Lowella Grip M.D.   On: 08/31/2014 12:12    Antibiotics:  Anti-infectives   None      Discharge Exam: Blood pressure 122/77, pulse 72, temperature 98.1 F (36.7 C), temperature source Oral, resp. rate 18, height 6' (1.829 m), weight 89.359 kg (197 lb), SpO2 97.00%. Neurologic: Grossly normal   Discharge Medications:     Medication List    STOP taking these medications       warfarin 5 MG tablet  Commonly known as:  COUMADIN      TAKE these medications       atorvastatin 80 MG tablet  Commonly known as:  LIPITOR  Take 80 mg by mouth at bedtime.     lithium carbonate 300 MG capsule  Take 300 mg by mouth 2 (two) times daily.     metFORMIN 500 MG tablet  Commonly known as:  GLUCOPHAGE  Take 500 mg by mouth at bedtime.     oxyCODONE-acetaminophen 5-325 MG per tablet  Commonly known as:  PERCOCET/ROXICET  Take 1-2 tablets by mouth every 4 (four) hours as needed for moderate pain.     traZODone 100 MG tablet  Commonly  known as:  DESYREL  Take 100 mg by mouth at bedtime.        Disposition: home   Final Dx: [facility]I with contusion      Discharge Instructions   Call MD for:  difficulty breathing, headache or visual disturbances    Complete by:  As directed      Call MD for:  hives    Complete by:  As directed      Call MD for:  persistant dizziness or light-headedness    Complete by:  As directed      Call MD for:  persistant nausea and vomiting    Complete by:  As directed      Call MD for:  severe uncontrolled pain    Complete by:  As directed      Call MD for:  temperature >100.4    Complete by:  As directed      Diet - low sodium heart healthy    Complete by:  As directed      Discharge instructions    Complete by:  As directed   No driving,no strenuous activity     Increase activity slowly    Complete by:  As directed            Follow-up Information   Follow up with , S, MD. Schedule an appointment as soon as possible for a visit in 10 days.   Specialty:  Neurosurgery   Contact information:   [address] [place] [address] [phone]        Signed: Eustace Moore 09/01/2014, 10:07 AM

## 2014-09-01 NOTE — Progress Notes (Signed)
Pt is being discharged home with family. Discharge instructions were given and pt understand when to call the doctor

## 2014-09-03 ENCOUNTER — Inpatient Hospital Stay (HOSPITAL_COMMUNITY)
Admission: EM | Admit: 2014-09-03 | Discharge: 2014-09-05 | DRG: 103 | Disposition: A | Discharge status: Home / Self Care | Payer: Medicare Other | Attending: Neurological Surgery | Admitting: Neurological Surgery

## 2014-09-03 ENCOUNTER — Emergency Department (HOSPITAL_COMMUNITY): Payer: Medicare Other

## 2014-09-03 ENCOUNTER — Encounter (HOSPITAL_COMMUNITY): Payer: Self-pay | Admitting: Emergency Medicine

## 2014-09-03 DIAGNOSIS — E871 Hypo-osmolality and hyponatremia: Secondary | ICD-10-CM

## 2014-09-03 DIAGNOSIS — I951 Orthostatic hypotension: Secondary | ICD-10-CM | POA: Diagnosis present

## 2014-09-03 DIAGNOSIS — Z96649 Presence of unspecified artificial hip joint: Secondary | ICD-10-CM | POA: Diagnosis present

## 2014-09-03 DIAGNOSIS — F0781 Postconcussional syndrome: Secondary | ICD-10-CM | POA: Diagnosis present

## 2014-09-03 DIAGNOSIS — C61 Malignant neoplasm of prostate: Secondary | ICD-10-CM | POA: Diagnosis present

## 2014-09-03 DIAGNOSIS — S06369D Traumatic hemorrhage of cerebrum, unspecified, with loss of consciousness of unspecified duration, subsequent encounter: Secondary | ICD-10-CM

## 2014-09-03 DIAGNOSIS — Z23 Encounter for immunization: Secondary | ICD-10-CM | POA: Diagnosis not present

## 2014-09-03 DIAGNOSIS — M1990 Unspecified osteoarthritis, unspecified site: Secondary | ICD-10-CM | POA: Diagnosis present

## 2014-09-03 DIAGNOSIS — Z888 Allergy status to other drugs, medicaments and biological substances status: Secondary | ICD-10-CM | POA: Diagnosis not present

## 2014-09-03 DIAGNOSIS — E119 Type 2 diabetes mellitus without complications: Secondary | ICD-10-CM | POA: Diagnosis present

## 2014-09-03 DIAGNOSIS — Z7901 Long term (current) use of anticoagulants: Secondary | ICD-10-CM | POA: Diagnosis not present

## 2014-09-03 DIAGNOSIS — Z87891 Personal history of nicotine dependence: Secondary | ICD-10-CM | POA: Diagnosis not present

## 2014-09-03 DIAGNOSIS — S06360D Traumatic hemorrhage of cerebrum, unspecified, without loss of consciousness, subsequent encounter: Secondary | ICD-10-CM | POA: Diagnosis not present

## 2014-09-03 DIAGNOSIS — Z882 Allergy status to sulfonamides status: Secondary | ICD-10-CM | POA: Diagnosis not present

## 2014-09-03 DIAGNOSIS — Z8249 Family history of ischemic heart disease and other diseases of the circulatory system: Secondary | ICD-10-CM | POA: Diagnosis not present

## 2014-09-03 DIAGNOSIS — I10 Essential (primary) hypertension: Secondary | ICD-10-CM | POA: Diagnosis present

## 2014-09-03 DIAGNOSIS — Y92007 Garden or yard of unspecified non-institutional (private) residence as the place of occurrence of the external cause: Secondary | ICD-10-CM | POA: Diagnosis not present

## 2014-09-03 DIAGNOSIS — E785 Hyperlipidemia, unspecified: Secondary | ICD-10-CM | POA: Diagnosis present

## 2014-09-03 DIAGNOSIS — Z86711 Personal history of pulmonary embolism: Secondary | ICD-10-CM | POA: Diagnosis not present

## 2014-09-03 DIAGNOSIS — Z8546 Personal history of malignant neoplasm of prostate: Secondary | ICD-10-CM | POA: Diagnosis not present

## 2014-09-03 DIAGNOSIS — S060XAA Concussion with loss of consciousness status unknown, initial encounter: Secondary | ICD-10-CM | POA: Diagnosis present

## 2014-09-03 DIAGNOSIS — S060X9A Concussion with loss of consciousness of unspecified duration, initial encounter: Secondary | ICD-10-CM | POA: Diagnosis present

## 2014-09-03 DIAGNOSIS — S060X9D Concussion with loss of consciousness of unspecified duration, subsequent encounter: Secondary | ICD-10-CM

## 2014-09-03 DIAGNOSIS — W010XXA Fall on same level from slipping, tripping and stumbling without subsequent striking against object, initial encounter: Secondary | ICD-10-CM | POA: Diagnosis present

## 2014-09-03 DIAGNOSIS — Z86718 Personal history of other venous thrombosis and embolism: Secondary | ICD-10-CM | POA: Diagnosis not present

## 2014-09-03 DIAGNOSIS — G44319 Acute post-traumatic headache, not intractable: Secondary | ICD-10-CM | POA: Diagnosis present

## 2014-09-03 DIAGNOSIS — Z79899 Other long term (current) drug therapy: Secondary | ICD-10-CM | POA: Diagnosis not present

## 2014-09-03 DIAGNOSIS — Y9301 Activity, walking, marching and hiking: Secondary | ICD-10-CM | POA: Diagnosis not present

## 2014-09-03 LAB — HEPATIC FUNCTION PANEL
ALK PHOS: 103 U/L (ref 39–117)
ALT: 16 U/L (ref 0–53)
AST: 16 U/L (ref 0–37)
Albumin: 3.7 g/dL (ref 3.5–5.2)
Bilirubin, Direct: 0.4 mg/dL — ABNORMAL HIGH (ref 0.0–0.3)
Indirect Bilirubin: 1.3 mg/dL — ABNORMAL HIGH (ref 0.3–0.9)
Total Bilirubin: 1.7 mg/dL — ABNORMAL HIGH (ref 0.3–1.2)
Total Protein: 7.6 g/dL (ref 6.0–8.3)

## 2014-09-03 LAB — CBC WITH DIFFERENTIAL/PLATELET
BASOS ABS: 0 10*3/uL (ref 0.0–0.1)
Basophils Relative: 0 % (ref 0–1)
EOS ABS: 0 10*3/uL (ref 0.0–0.7)
EOS PCT: 0 % (ref 0–5)
HEMATOCRIT: 39.5 % (ref 39.0–52.0)
Hemoglobin: 13.8 g/dL (ref 13.0–17.0)
Lymphocytes Relative: 7 % — ABNORMAL LOW (ref 12–46)
Lymphs Abs: 0.7 10*3/uL (ref 0.7–4.0)
MCH: 31.2 pg (ref 26.0–34.0)
MCHC: 34.9 g/dL (ref 30.0–36.0)
MCV: 89.4 fL (ref 78.0–100.0)
MONO ABS: 0.7 10*3/uL (ref 0.1–1.0)
Monocytes Relative: 7 % (ref 3–12)
Neutro Abs: 8.9 10*3/uL — ABNORMAL HIGH (ref 1.7–7.7)
Neutrophils Relative %: 86 % — ABNORMAL HIGH (ref 43–77)
Platelets: 164 10*3/uL (ref 150–400)
RBC: 4.42 MIL/uL (ref 4.22–5.81)
RDW: 12.7 % (ref 11.5–15.5)
WBC: 10.4 10*3/uL (ref 4.0–10.5)

## 2014-09-03 LAB — GLUCOSE, CAPILLARY: GLUCOSE-CAPILLARY: 180 mg/dL — AB (ref 70–99)

## 2014-09-03 LAB — URINALYSIS, ROUTINE W REFLEX MICROSCOPIC
Bilirubin Urine: NEGATIVE
Glucose, UA: 100 mg/dL — AB
Hgb urine dipstick: NEGATIVE
Ketones, ur: 15 mg/dL — AB
Leukocytes, UA: NEGATIVE
Nitrite: NEGATIVE
Protein, ur: NEGATIVE mg/dL
Specific Gravity, Urine: 1.016 (ref 1.005–1.030)
Urobilinogen, UA: 1 mg/dL (ref 0.0–1.0)
pH: 8 (ref 5.0–8.0)

## 2014-09-03 LAB — BASIC METABOLIC PANEL
ANION GAP: 12 (ref 5–15)
BUN: 10 mg/dL (ref 6–23)
CALCIUM: 9.2 mg/dL (ref 8.4–10.5)
CO2: 24 mEq/L (ref 19–32)
Chloride: 92 mEq/L — ABNORMAL LOW (ref 96–112)
Creatinine, Ser: 0.73 mg/dL (ref 0.50–1.35)
GFR calc Af Amer: >90 mL/min (ref >90)
GFR calc non Af Amer: >90 mL/min (ref >90)
Glucose, Bld: 160 mg/dL — ABNORMAL HIGH (ref 70–99)
Potassium: 4.2 mEq/L (ref 3.7–5.3)
Sodium: 128 mEq/L — ABNORMAL LOW (ref 137–147)

## 2014-09-03 LAB — LITHIUM LEVEL: Lithium Lvl: 0.29 mEq/L — ABNORMAL LOW (ref 0.80–1.40)

## 2014-09-03 LAB — PROTIME-INR
INR: 1.21 (ref 0.00–1.49)
Prothrombin Time: 15.3 seconds — ABNORMAL HIGH (ref 11.6–15.2)

## 2014-09-03 LAB — CBG MONITORING, ED: GLUCOSE-CAPILLARY: 126 mg/dL — AB (ref 70–99)

## 2014-09-03 MED ORDER — DEXAMETHASONE SODIUM PHOSPHATE 4 MG/ML IJ SOLN
4.0000 mg | Freq: Four times a day (QID) | INTRAMUSCULAR | Status: DC
Start: 2014-09-03 — End: 2014-09-04
  Administered 2014-09-03 – 2014-09-04 (×2): 4 mg via INTRAVENOUS
  Filled 2014-09-03 (×3): qty 1

## 2014-09-03 MED ORDER — DEXAMETHASONE SODIUM PHOSPHATE 10 MG/ML IJ SOLN: 10.0000 mg | Freq: Once | INTRAMUSCULAR | Status: DC

## 2014-09-03 MED ORDER — LITHIUM CARBONATE 300 MG PO CAPS
600.0000 mg | ORAL_CAPSULE | Freq: Every day | ORAL | Status: DC
  Administered 2014-09-03 – 2014-09-04 (×2): 600 mg via ORAL
  Filled 2014-09-03 (×4): qty 2

## 2014-09-03 MED ORDER — ACETAMINOPHEN 650 MG RE SUPP: 650.0000 mg | RECTAL | Status: DC | PRN

## 2014-09-03 MED ORDER — OXYCODONE-ACETAMINOPHEN 5-325 MG PO TABS
1.0000 | ORAL_TABLET | ORAL | Status: DC | PRN
  Administered 2014-09-03 – 2014-09-04 (×3): 1 via ORAL
  Administered 2014-09-04 (×2): 2 via ORAL
  Administered 2014-09-04: 1 via ORAL
  Administered 2014-09-04 – 2014-09-05 (×3): 2 via ORAL
  Filled 2014-09-03: qty 2
  Filled 2014-09-03: qty 1
  Filled 2014-09-03: qty 2
  Filled 2014-09-03 (×2): qty 1
  Filled 2014-09-03 (×3): qty 2
  Filled 2014-09-03: qty 1

## 2014-09-03 MED ORDER — HYDROMORPHONE HCL 1 MG/ML IJ SOLN
1.0000 mg | INTRAMUSCULAR | Status: AC
  Administered 2014-09-03: 1 mg via INTRAVENOUS
  Filled 2014-09-03: qty 1

## 2014-09-03 MED ORDER — POTASSIUM CHLORIDE IN NACL 20-0.9 MEQ/L-% IV SOLN
INTRAVENOUS | Status: DC
Start: 2014-09-03 — End: 2014-09-05
  Administered 2014-09-03: 19:00:00 via INTRAVENOUS
  Filled 2014-09-03: qty 1000

## 2014-09-03 MED ORDER — ONDANSETRON HCL 4 MG/2ML IJ SOLN
4.0000 mg | Freq: Once | INTRAMUSCULAR | Status: AC
  Administered 2014-09-03: 4 mg via INTRAVENOUS
  Filled 2014-09-03: qty 2

## 2014-09-03 MED ORDER — DEXAMETHASONE SODIUM PHOSPHATE 4 MG/ML IJ SOLN
4.0000 mg | Freq: Once | INTRAMUSCULAR | Status: AC
  Administered 2014-09-03: 4 mg via INTRAVENOUS
  Filled 2014-09-03 (×2): qty 1

## 2014-09-03 MED ORDER — SODIUM CHLORIDE 0.9 % IV SOLN
Freq: Once | INTRAVENOUS | Status: AC
  Administered 2014-09-03: 15:00:00 via INTRAVENOUS

## 2014-09-03 MED ORDER — ACETAMINOPHEN 325 MG PO TABS
650.0000 mg | ORAL_TABLET | ORAL | Status: DC | PRN
  Administered 2014-09-05: 650 mg via ORAL
  Filled 2014-09-03: qty 2

## 2014-09-03 MED ORDER — INSULIN ASPART 100 UNIT/ML ~~LOC~~ SOLN
0.0000 [IU] | Freq: Three times a day (TID) | SUBCUTANEOUS | Status: DC
  Administered 2014-09-04: 2 [IU] via SUBCUTANEOUS
  Administered 2014-09-04 (×2): 3 [IU] via SUBCUTANEOUS
  Administered 2014-09-05: 2 [IU] via SUBCUTANEOUS

## 2014-09-03 MED ORDER — HYDROMORPHONE HCL 1 MG/ML IJ SOLN: 0.5000 mg | INTRAMUSCULAR | Status: DC | PRN

## 2014-09-03 MED ORDER — METFORMIN HCL 500 MG PO TABS
500.0000 mg | ORAL_TABLET | Freq: Every day | ORAL | Status: DC
  Administered 2014-09-03 – 2014-09-04 (×2): 500 mg via ORAL
  Filled 2014-09-03 (×2): qty 1

## 2014-09-03 MED ORDER — ONDANSETRON HCL 4 MG/2ML IJ SOLN
4.0000 mg | INTRAMUSCULAR | Status: DC | PRN
  Administered 2014-09-03: 4 mg via INTRAVENOUS
  Filled 2014-09-03: qty 2

## 2014-09-03 MED ORDER — DEXAMETHASONE 4 MG PO TABS
4.0000 mg | ORAL_TABLET | Freq: Four times a day (QID) | ORAL | Status: DC
  Administered 2014-09-04 (×2): 4 mg via ORAL
  Filled 2014-09-03 (×3): qty 1

## 2014-09-03 MED ORDER — POLYETHYLENE GLYCOL 3350 17 G PO PACK: 17.0000 g | PACK | Freq: Every day | ORAL | Status: DC

## 2014-09-03 MED ORDER — DEXAMETHASONE SODIUM PHOSPHATE 4 MG/ML IJ SOLN
10.0000 mg | Freq: Once | INTRAMUSCULAR | Status: DC
  Filled 2014-09-03: qty 3

## 2014-09-03 MED ORDER — SODIUM CHLORIDE 0.9 % IV BOLUS (SEPSIS)
1000.0000 mL | Freq: Once | INTRAVENOUS | Status: AC
  Administered 2014-09-03: 1000 mL via INTRAVENOUS

## 2014-09-03 NOTE — ED Notes (Signed)
Out of decadron in [facility]. Called pharmacy to have med sent to ED.

## 2014-09-03 NOTE — ED Provider Notes (Signed)
CSN: [id number]     Arrival date & time 09/03/14  1218 History   First MD Initiated Contact with Patient 09/03/14 1247     Chief Complaint  Patient presents with  . Fall     (Consider location/radiation/quality/duration/timing/severity/associated sxs/prior Treatment) The history is provided by the patient and a significant other.    Pt with recent hx ICH on coumadin, fall 1 week ago, developed headache 5 days ago, seen in ED with ICH, admitted by neurosurgery.  Pt states he has been home for two days, has increased headache involving his entire head, lightheadedness, generalized weakness, gait imbalance (holding onto walls, though he can walk a few steps unassisted).  Has had 2-3 episodes of vomiting since discharge.  Denies diplopia, focal weakness or numbness of the extremities.  Denies fevers, CP, SOB, leg swelling.  Denies bowel or bladder retention or incontinence.  Taking percocet at home without relief.    Past Medical History  Diagnosis Date  . Diabetes mellitus   . DVT (deep venous thrombosis)   . PE (pulmonary embolism)   . Prostate ca   . Orthostatic hypotension   . PROSTATE CANCER   . PERSONAL HISTORY, VENOUS THROMBOSIS AND EMBOLISM   . Hypertension   . Hyperlipidemia   . Osteoarthritis    Past Surgical History  Procedure Laterality Date  . Total hip arthroplasty    . Tonsillectomy and adenoidectomy      AS A CHILD  . Colonoscopy with propofol  10/2003   Family History  Problem Relation Age of Onset  . Heart attack Father   . Depression Sister   . Hyperlipidemia Mother   . Hyperlipidemia Father   . Hypertension Mother   . Hypertension Father   . Sudden death Father    History  Substance Use Topics  . Smoking status: Former Smoker -- 1.00 packs/day for 30 years    Types: Cigarettes  . Smokeless tobacco: Never Used  . Alcohol Use: Yes     Comment: occasionally    Review of Systems  All other systems reviewed and are negative.     Allergies    Altace and Sulfonamide derivatives  Home Medications   Prior to Admission medications   Medication Sig Start Date End Date Taking? Authorizing Provider  atorvastatin (LIPITOR) 80 MG tablet Take 80 mg by mouth at bedtime.   Yes Historical Provider, MD  lithium carbonate 300 MG capsule Take 600 mg by mouth at bedtime.    Yes Historical Provider, MD  metFORMIN (GLUCOPHAGE) 500 MG tablet Take 500 mg by mouth at bedtime.   Yes Historical Provider, MD  oxyCODONE-acetaminophen (PERCOCET/ROXICET) 5-325 MG per tablet Take 1-2 tablets by mouth every 4 (four) hours as needed for moderate pain. 09/01/14  Yes Eustace Moore, MD  traZODone (DESYREL) 100 MG tablet Take 100 mg by mouth at bedtime.   Yes Historical Provider, MD   BP 157/86  Pulse 77  Temp(Src) 97.8 F (36.6 C) (Oral)  Resp 24  SpO2 100% Physical Exam  Nursing note and vitals reviewed. Constitutional: He appears well-developed and well-nourished. No distress.  HENT:  Head: Normocephalic and atraumatic.  Neck: Neck supple.  Cardiovascular: Normal rate and regular rhythm.   Pulmonary/Chest: Effort normal and breath sounds normal. No respiratory distress. He has no wheezes. He has no rales.  Abdominal: Soft. He exhibits no distension. There is no tenderness. There is no rebound and no guarding.  Musculoskeletal:       Back:  Neurological: He is  alert. He has normal strength. No cranial nerve deficit or sensory deficit. He exhibits normal muscle tone. Coordination normal. GCS eye subscore is 4. GCS verbal subscore is 5. GCS motor subscore is 6.  Gait testing deferred due to patient's report of imbalance, weakness  Skin: He is not diaphoretic.    ED Course  Procedures (including critical care time) Labs Review Labs Reviewed  CBC WITH DIFFERENTIAL - Abnormal; Notable for the following:    Neutrophils Relative % 86 (*)    Neutro Abs 8.9 (*)    Lymphocytes Relative 7 (*)    All other components within normal limits  BASIC METABOLIC  PANEL - Abnormal; Notable for the following:    Sodium 128 (*)    Chloride 92 (*)    Glucose, Bld 160 (*)    All other components within normal limits  PROTIME-INR - Abnormal; Notable for the following:    Prothrombin Time 15.3 (*)    All other components within normal limits  URINALYSIS, ROUTINE W REFLEX MICROSCOPIC - Abnormal; Notable for the following:    APPearance CLOUDY (*)    Glucose, UA 100 (*)    Ketones, ur 15 (*)    All other components within normal limits  HEPATIC FUNCTION PANEL - Abnormal; Notable for the following:    Total Bilirubin 1.7 (*)    Bilirubin, Direct 0.4 (*)    Indirect Bilirubin 1.3 (*)    All other components within normal limits  LITHIUM LEVEL - Abnormal; Notable for the following:    Lithium Lvl 0.29 (*)    All other components within normal limits    Imaging Review Dg Lumbar Spine Complete  09/03/2014   CLINICAL DATA:  Status post fall last week.  Left low back pain.  EXAM: LUMBAR SPINE - COMPLETE 4+ VIEW  COMPARISON:  CT abdomen and pelvis 0 8/clear 01/2010.  FINDINGS: There is no fracture or malalignment. Multilevel anterior endplate spurring is unchanged. Intervertebral disc space height is maintained. Paraspinous structures demonstrate bilateral hip replacements.  IMPRESSION: No acute abnormality.   Electronically Signed   By: Inge Rise M.D.   On: 09/03/2014 14:31   Ct Head Wo Contrast  09/03/2014   CLINICAL DATA:  [place] almost a week ago, headache, dizziness, neck pain, low back pain, recent intracranial hemorrhage, symptoms not better ; personal history of diabetes mellitus, pulmonary embolism, prostate cancer, hypertension  EXAM: CT HEAD WITHOUT CONTRAST  CT CERVICAL SPINE WITHOUT CONTRAST  TECHNIQUE: Multidetector CT imaging of the head and cervical spine was performed following the standard protocol without intravenous contrast. Multiplanar CT image reconstructions of the cervical spine were also generated.  COMPARISON:  CT head 09/01/2014,  08/31/2014  FINDINGS: CT HEAD FINDINGS  Intra cerebral hematoma again identified in the posterior LEFT temporal lobe, 2.2 x 1.5 cm previously 2.2 x 1.7 cm.  Mild adjacent vasogenic edema.  No new areas of intracranial hemorrhage or acute infarction identified.  No new mass lesion.  Prominent CSF collections at the frontal regions question small hygromas unchanged.  No midline shift, mass effect or hydrocephalus.  Atherosclerotic calcifications at skullbase.  Osseous structures intact with visualized paranasal sinuses and mastoid air cells clear.  CT CERVICAL SPINE FINDINGS  Visualized skullbase intact.  Scattered atherosclerotic calcifications.  Multilevel degenerative disc and facet disease changes.  Vertebral body heights maintained without fracture or subluxation.  No bone destruction or spondylolysis.  Soft tissues unremarkable.  IMPRESSION: Stable appearance of intercerebral hematoma at posterior LEFT parietal lobe 2.2 x 1.5  cm with surrounding vasogenic edema.  No new intracranial abnormalities identified.  Degenerative disc and facet disease changes of the cervical spine.  No acute cervical spine abnormalities.   Electronically Signed   By: Lavonia Dana M.D.   On: 09/03/2014 15:05   Ct Cervical Spine Wo Contrast  09/03/2014   CLINICAL DATA:  [place] almost a week ago, headache, dizziness, neck pain, low back pain, recent intracranial hemorrhage, symptoms not better ; personal history of diabetes mellitus, pulmonary embolism, prostate cancer, hypertension  EXAM: CT HEAD WITHOUT CONTRAST  CT CERVICAL SPINE WITHOUT CONTRAST  TECHNIQUE: Multidetector CT imaging of the head and cervical spine was performed following the standard protocol without intravenous contrast. Multiplanar CT image reconstructions of the cervical spine were also generated.  COMPARISON:  CT head 09/01/2014, 08/31/2014  FINDINGS: CT HEAD FINDINGS  Intra cerebral hematoma again identified in the posterior LEFT temporal lobe, 2.2 x 1.5 cm  previously 2.2 x 1.7 cm.  Mild adjacent vasogenic edema.  No new areas of intracranial hemorrhage or acute infarction identified.  No new mass lesion.  Prominent CSF collections at the frontal regions question small hygromas unchanged.  No midline shift, mass effect or hydrocephalus.  Atherosclerotic calcifications at skullbase.  Osseous structures intact with visualized paranasal sinuses and mastoid air cells clear.  CT CERVICAL SPINE FINDINGS  Visualized skullbase intact.  Scattered atherosclerotic calcifications.  Multilevel degenerative disc and facet disease changes.  Vertebral body heights maintained without fracture or subluxation.  No bone destruction or spondylolysis.  Soft tissues unremarkable.  IMPRESSION: Stable appearance of intercerebral hematoma at posterior LEFT parietal lobe 2.2 x 1.5 cm with surrounding vasogenic edema.  No new intracranial abnormalities identified.  Degenerative disc and facet disease changes of the cervical spine.  No acute cervical spine abnormalities.   Electronically Signed   By: Lavonia Dana M.D.   On: 09/03/2014 15:05     EKG Interpretation None      MDM   Final diagnoses:  Traumatic intracerebral hemorrhage, unspecified laterality, subsequent encounter    Pt with fall last week on coumadin with ICH, d/c from hospital two days ago, now with worsening headache, weakness, lightheadedness, vomiting, anorexia, gait imbalance.  CT head shows stable intracerebral hematoma with edema that is unchanged.  Labs show hyponatremia.  IVF given, dilaudid given.  Discussed with Dr Ronnald Ramp, neurosurgery, who agrees with decadron, will admit for pain control and hydration.  Does note this is likely expected symptoms of postconcussive syndrome.      [place], PA-C 09/03/14 1609

## 2014-09-03 NOTE — H&P (Signed)
  This patient is well-known to me as he was recently discharged his 48 hours ago from the hospital after he suffered a concussion and a small brain contusion [place]  He was anticoagulated prior to the fall.  He has been off of his anticoagulation.  He was discharged home with instructions about postconcussive symptoms.  He indeed has those symptoms in the form of dizziness, nausea and vomiting, and headache.  He is unable to eat or drink much at home cause of how he feels.  He presented to the emergency department today where CT scan was quite stable.  Because of his failure to thrive and his inability to hydrate and his continued uncontrolled headache he will be re-admitted for pain control and hydration.

## 2014-09-03 NOTE — Progress Notes (Signed)
59 - report recd from [place], [place]. Will monitor for pt's arrival to unit   Angeline Slim I 09/03/2014 5:39 PM

## 2014-09-03 NOTE — Progress Notes (Signed)
1807 - pt arrived to unit per bed with belongings and family member at side from ED with ED tech. No distress noted. Will monitor closely  Angeline Slim I 09/03/2014 7:45 PM

## 2014-09-03 NOTE — ED Notes (Addendum)
Sats of 91% on RA with good pleth.  Placed on 2L [place] and sats improved to 95L.  Dr Ashok Cordia notified and at bedside.

## 2014-09-03 NOTE — ED Notes (Signed)
Per pt sts fall almost a week ago and not better. sts he was seen here and admitted over night. sts dizzy, headache, neck pain and lower back.

## 2014-09-04 LAB — GLUCOSE, CAPILLARY
Glucose-Capillary: 146 mg/dL — ABNORMAL HIGH (ref 70–99)
Glucose-Capillary: 180 mg/dL — ABNORMAL HIGH (ref 70–99)
Glucose-Capillary: 164 mg/dL — ABNORMAL HIGH (ref 70–99)

## 2014-09-04 NOTE — ED Provider Notes (Signed)
Medical screening examination/treatment/procedure(s) were conducted as a shared visit with non-physician practitioner(s) and myself.  I personally evaluated the patient during the encounter.  Pt with recent fall and intracerebral hemorrhage/hematoma. Since d/c c/o intractable headaches, nausea, and gen malaise. Repeat ct w edema around initial hemorrhage.  Discussed w ns, will add decadron for symptom relief, and admit for supportive tx.  Spine nt. abd soft nt.    Mirna Mires, MD 09/04/14 [phone]

## 2014-09-04 NOTE — Progress Notes (Signed)
UR complete.    RN, MSN 

## 2014-09-04 NOTE — Progress Notes (Signed)
Patient ID: [name] Hall, male   DOB: [birth year], 68 y.o.   MRN: [id number] Much better, minimal headache. No NTW. No N/V. Walking ok. Neuro non-focal.

## 2014-09-05 LAB — GLUCOSE, CAPILLARY
GLUCOSE-CAPILLARY: 165 mg/dL — AB (ref 70–99)
GLUCOSE-CAPILLARY: 139 mg/dL — AB (ref 70–99)

## 2014-09-05 MED ORDER — METHYLPREDNISOLONE (PAK) 4 MG PO TABS: ORAL_TABLET | ORAL | Status: DC

## 2014-09-05 MED ORDER — DEXAMETHASONE SODIUM PHOSPHATE 4 MG/ML IJ SOLN
4.0000 mg | Freq: Four times a day (QID) | INTRAMUSCULAR | Status: DC
  Administered 2014-09-05: 4 mg via INTRAVENOUS
  Filled 2014-09-05: qty 1

## 2014-09-05 NOTE — Progress Notes (Signed)
Discharge orders received. Pt for discharge home today. IV d/c'd. Dressing clean, dry, intact to left elbow. Pt given discharge instructions with verbalized understanding. Family will be here to pick up pt. Staff brought pt downstairs via wheelchair.

## 2014-09-05 NOTE — Discharge Summary (Signed)
Physician Discharge Summary  Patient ID: [name] Hall MRN: [id number] DOB/AGE: 1946/10/22 68 y.o.  Admit date: 09/03/2014 Discharge date: 09/05/2014  Admission Diagnoses: Concussion/ cerebral contusion    Discharge Diagnoses: Same   Discharged Condition: stable  Hospital Course: The patient was admitted on 09/03/2014 with worsening of headache and dizziness related to concussion/cerebral contusion. he improved with steroids. The hospital course was routine. There were no complications. Pt had appropriate mild headache. No complaints of arm pain or new N/T/W. he did have back pain and plain films did not show a fracture.The patient remained afebrile with stable vital signs, and tolerated a regular diet. The patient continued to increase activities, and pain was well controlled with oral pain medications.   Consults: None  Significant Diagnostic Studies:  Results for orders placed during the hospital encounter of 09/03/14  CBC WITH DIFFERENTIAL      Result Value Ref Range   WBC 10.4  4.0 - 10.5 K/uL   RBC 4.42  4.22 - 5.81 MIL/uL   Hemoglobin 13.8  13.0 - 17.0 g/dL   HCT 39.5  39.0 - 52.0 %   MCV 89.4  78.0 - 100.0 fL   MCH 31.2  26.0 - 34.0 pg   MCHC 34.9  30.0 - 36.0 g/dL   RDW 12.7  11.5 - 15.5 %   Platelets 164  150 - 400 K/uL   Neutrophils Relative % 86 (*) 43 - 77 %   Neutro Abs 8.9 (*) 1.7 - 7.7 K/uL   Lymphocytes Relative 7 (*) 12 - 46 %   Lymphs Abs 0.7  0.7 - 4.0 K/uL   Monocytes Relative 7  3 - 12 %   Monocytes Absolute 0.7  0.1 - 1.0 K/uL   Eosinophils Relative 0  0 - 5 %   Eosinophils Absolute 0.0  0.0 - 0.7 K/uL   Basophils Relative 0  0 - 1 %   Basophils Absolute 0.0  0.0 - 0.1 K/uL  BASIC METABOLIC PANEL      Result Value Ref Range   Sodium 128 (*) 137 - 147 mEq/L   Potassium 4.2  3.7 - 5.3 mEq/L   Chloride 92 (*) 96 - 112 mEq/L   CO2 24  19 - 32 mEq/L   Glucose, Bld 160 (*) 70 - 99 mg/dL   BUN 10  6 - 23 mg/dL   Creatinine, Ser 0.73  0.50 - 1.35 mg/dL    Calcium 9.2  8.4 - 10.5 mg/dL   GFR calc non Af Amer >90  >90 mL/min   GFR calc Af Amer >90  >90 mL/min   Anion gap 12  5 - 15  PROTIME-INR      Result Value Ref Range   Prothrombin Time 15.3 (*) 11.6 - 15.2 seconds   INR 1.21  0.00 - 1.49  URINALYSIS, ROUTINE W REFLEX MICROSCOPIC      Result Value Ref Range   Color, Urine YELLOW  YELLOW   APPearance CLOUDY (*) CLEAR   Specific Gravity, Urine 1.016  1.005 - 1.030   pH 8.0  5.0 - 8.0   Glucose, UA 100 (*) NEGATIVE mg/dL   Hgb urine dipstick NEGATIVE  NEGATIVE   Bilirubin Urine NEGATIVE  NEGATIVE   Ketones, ur 15 (*) NEGATIVE mg/dL   Protein, ur NEGATIVE  NEGATIVE mg/dL   Urobilinogen, UA 1.0  0.0 - 1.0 mg/dL   Nitrite NEGATIVE  NEGATIVE   Leukocytes, UA NEGATIVE  NEGATIVE  HEPATIC FUNCTION PANEL  Result Value Ref Range   Total Protein 7.6  6.0 - 8.3 g/dL   Albumin 3.7  3.5 - 5.2 g/dL   AST 16  0 - 37 U/L   ALT 16  0 - 53 U/L   Alkaline Phosphatase 103  39 - 117 U/L   Total Bilirubin 1.7 (*) 0.3 - 1.2 mg/dL   Bilirubin, Direct 0.4 (*) 0.0 - 0.3 mg/dL   Indirect Bilirubin 1.3 (*) 0.3 - 0.9 mg/dL  LITHIUM LEVEL      Result Value Ref Range   Lithium Lvl 0.29 (*) 0.80 - 1.40 mEq/L  GLUCOSE, CAPILLARY      Result Value Ref Range   Glucose-Capillary 180 (*) 70 - 99 mg/dL   Comment 1 Documented in Chart     Comment 2 Notify RN    GLUCOSE, CAPILLARY      Result Value Ref Range   Glucose-Capillary 146 (*) 70 - 99 mg/dL  GLUCOSE, CAPILLARY      Result Value Ref Range   Glucose-Capillary 180 (*) 70 - 99 mg/dL  GLUCOSE, CAPILLARY      Result Value Ref Range   Glucose-Capillary 164 (*) 70 - 99 mg/dL  GLUCOSE, CAPILLARY      Result Value Ref Range   Glucose-Capillary 165 (*) 70 - 99 mg/dL   Comment 1 Documented in Chart     Comment 2 Notify RN    GLUCOSE, CAPILLARY      Result Value Ref Range   Glucose-Capillary 139 (*) 70 - 99 mg/dL   Comment 1 Documented in Chart     Comment 2 Notify RN    CBG MONITORING, ED       Result Value Ref Range   Glucose-Capillary 126 (*) 70 - 99 mg/dL    Dg Lumbar Spine Complete  09/03/2014   CLINICAL DATA:  Status post fall last week.  Left low back pain.  EXAM: LUMBAR SPINE - COMPLETE 4+ VIEW  COMPARISON:  CT abdomen and pelvis 0 8/clear 01/2010.  FINDINGS: There is no fracture or malalignment. Multilevel anterior endplate spurring is unchanged. Intervertebral disc space height is maintained. Paraspinous structures demonstrate bilateral hip replacements.  IMPRESSION: No acute abnormality.   Electronically Signed   By: Inge Rise M.D.   On: 09/03/2014 14:31   Dg Elbow Complete Left  08/31/2014   CLINICAL DATA:  Fall on wet timber while hiking 4 days prior, landing on elbow  EXAM: LEFT ELBOW - COMPLETE 3+ VIEW  COMPARISON:  None.  FINDINGS: Frontal, lateral, and bilateral oblique views were obtained. There is no apparent fracture, dislocation, or effusion. There is moderate generalized osteoarthritic change. No erosive change.  IMPRESSION: Generalized osteoarthritic change. No fracture, dislocation, or effusion.   Electronically Signed   By: Lowella Grip M.D.   On: 08/31/2014 11:35   Ct Head Wo Contrast  09/03/2014   CLINICAL DATA:  [place] almost a week ago, headache, dizziness, neck pain, low back pain, recent intracranial hemorrhage, symptoms not better ; personal history of diabetes mellitus, pulmonary embolism, prostate cancer, hypertension  EXAM: CT HEAD WITHOUT CONTRAST  CT CERVICAL SPINE WITHOUT CONTRAST  TECHNIQUE: Multidetector CT imaging of the head and cervical spine was performed following the standard protocol without intravenous contrast. Multiplanar CT image reconstructions of the cervical spine were also generated.  COMPARISON:  CT head 09/01/2014, 08/31/2014  FINDINGS: CT HEAD FINDINGS  Intra cerebral hematoma again identified in the posterior LEFT temporal lobe, 2.2 x 1.5 cm previously 2.2 x 1.7 cm.  Mild adjacent vasogenic edema.  No new areas of intracranial  hemorrhage or acute infarction identified.  No new mass lesion.  Prominent CSF collections at the frontal regions question small hygromas unchanged.  No midline shift, mass effect or hydrocephalus.  Atherosclerotic calcifications at skullbase.  Osseous structures intact with visualized paranasal sinuses and mastoid air cells clear.  CT CERVICAL SPINE FINDINGS  Visualized skullbase intact.  Scattered atherosclerotic calcifications.  Multilevel degenerative disc and facet disease changes.  Vertebral body heights maintained without fracture or subluxation.  No bone destruction or spondylolysis.  Soft tissues unremarkable.  IMPRESSION: Stable appearance of intercerebral hematoma at posterior LEFT parietal lobe 2.2 x 1.5 cm with surrounding vasogenic edema.  No new intracranial abnormalities identified.  Degenerative disc and facet disease changes of the cervical spine.  No acute cervical spine abnormalities.   Electronically Signed   By: Lavonia Dana M.D.   On: 09/03/2014 15:05   Ct Head Wo Contrast  09/01/2014   CLINICAL DATA:  Follow-up intracranial hemorrhage. Worsening headache.  EXAM: CT HEAD WITHOUT CONTRAST  TECHNIQUE: Contiguous axial images were obtained from the base of the skull through the vertex without intravenous contrast.  COMPARISON:  08/31/2014  FINDINGS: Acute parenchymal hemorrhage in the posterior left temporal lobe does not appear significantly changed in size, measuring approximately 2.2 x 1.7 cm. Mild surrounding vasogenic edema does not appear significantly changed. There is no evidence of new intracranial hemorrhage. Small low density subdural collections overlying both frontal lobes do not appear significantly changed and are compatible with small subdural hygromas. 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 Ventricles are normal for age. There is no midline shift.  Orbits are unremarkable. Paranasal sinuses and mastoid air cells are clear. Moderate carotid siphon  calcification is noted.  IMPRESSION: Unchanged posterior left temporal parenchymal hematoma and mild surrounding edema. No new hemorrhage or midline shift.   Electronically Signed   By: Logan Bores   On: 09/01/2014 09:42   Ct Head Wo Contrast  08/31/2014   CLINICAL DATA:  Headache following fall 4 days prior. Patient takes Coumadin  EXAM: CT HEAD WITHOUT CONTRAST  TECHNIQUE: Contiguous axial images were obtained from the base of the skull through the vertex without intravenous contrast.  COMPARISON:  Apr 13, 2012  FINDINGS: There is mild diffuse atrophy. There is a hemorrhagic focus in the posterior aspect of the left temporal lobe superiorly measuring 2.5 x 1.6 cm with mild surrounding edema. No other acute hemorrhage is seen. This hemorrhagic focus is not causing significant mass effect.  There are subdural hygromas in each frontal lobe region causing modest impression on the respective frontal lobes. The subdural hygroma on the right measures 7 mm in thickness. The subdural hygroma on the left measures 5 mm in thickness. No acute hemorrhage is seen in either of these areas.  There is no midline shift. There is mild small vessel disease in the centra semiovale. 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  Bony calvarium appears intact.  The mastoid air cells are clear.  IMPRESSION: Focal area of hemorrhage with mild surrounding edema in the posterior left temporal lobe measuring 2.5 x 1.6 cm.  Nonacute appearing small subdural hygromas in each frontal region.  Mild periventricular small vessel disease.  Attempts at this time to reach the patient's referring physician were unsuccessful. I spoke directly to the patient, informing him that he needed to be transported by ambulance to the nearest emergency department  for further evaluation. The patient agreed to this plan, and arrangements were made for ambulance transport from the imaging center office to the nearest emergency  department. Further attempts will be made to contact the patient's referring physician about these findings and the patient's disposition.   Electronically Signed   By: Lowella Grip M.D.   On: 08/31/2014 12:12   Ct Cervical Spine Wo Contrast  09/03/2014   CLINICAL DATA:  [place] almost a week ago, headache, dizziness, neck pain, low back pain, recent intracranial hemorrhage, symptoms not better ; personal history of diabetes mellitus, pulmonary embolism, prostate cancer, hypertension  EXAM: CT HEAD WITHOUT CONTRAST  CT CERVICAL SPINE WITHOUT CONTRAST  TECHNIQUE: Multidetector CT imaging of the head and cervical spine was performed following the standard protocol without intravenous contrast. Multiplanar CT image reconstructions of the cervical spine were also generated.  COMPARISON:  CT head 09/01/2014, 08/31/2014  FINDINGS: CT HEAD FINDINGS  Intra cerebral hematoma again identified in the posterior LEFT temporal lobe, 2.2 x 1.5 cm previously 2.2 x 1.7 cm.  Mild adjacent vasogenic edema.  No new areas of intracranial hemorrhage or acute infarction identified.  No new mass lesion.  Prominent CSF collections at the frontal regions question small hygromas unchanged.  No midline shift, mass effect or hydrocephalus.  Atherosclerotic calcifications at skullbase.  Osseous structures intact with visualized paranasal sinuses and mastoid air cells clear.  CT CERVICAL SPINE FINDINGS  Visualized skullbase intact.  Scattered atherosclerotic calcifications.  Multilevel degenerative disc and facet disease changes.  Vertebral body heights maintained without fracture or subluxation.  No bone destruction or spondylolysis.  Soft tissues unremarkable.  IMPRESSION: Stable appearance of intercerebral hematoma at posterior LEFT parietal lobe 2.2 x 1.5 cm with surrounding vasogenic edema.  No new intracranial abnormalities identified.  Degenerative disc and facet disease changes of the cervical spine.  No acute cervical spine  abnormalities.   Electronically Signed   By: Lavonia Dana M.D.   On: 09/03/2014 15:05    Antibiotics:  Anti-infectives   None      Discharge Exam: Blood pressure 137/86, pulse 69, temperature 98.1 F (36.7 C), temperature source Oral, resp. rate 18, height 6\' 6"  (1.981 m), weight 89.812 kg (198 lb), SpO2 99.00%. Neurologic: Grossly normal   Discharge Medications:     Medication List         atorvastatin 80 MG tablet  Commonly known as:  LIPITOR  Take 80 mg by mouth at bedtime.     lithium carbonate 300 MG capsule  Take 600 mg by mouth at bedtime.     metFORMIN 500 MG tablet  Commonly known as:  GLUCOPHAGE  Take 500 mg by mouth at bedtime.     methylPREDNIsolone 4 MG tablet  Commonly known as:  MEDROL DOSPACK  follow package directions     oxyCODONE-acetaminophen 5-325 MG per tablet  Commonly known as:  PERCOCET/ROXICET  Take 1-2 tablets by mouth every 4 (four) hours as needed for moderate pain.     traZODone 100 MG tablet  Commonly known as:  DESYREL  Take 100 mg by mouth at bedtime.        Disposition: Home   Final Dx: Cerebral contusion/concussion      Discharge Instructions   Call MD for:  difficulty breathing, headache or visual disturbances    Complete by:  As directed      Call MD for:  persistant dizziness or light-headedness    Complete by:  As directed  Call MD for:  persistant nausea and vomiting    Complete by:  As directed      Call MD for:  severe uncontrolled pain    Complete by:  As directed      Call MD for:  temperature >100.4    Complete by:  As directed      Diet - low sodium heart healthy    Complete by:  As directed      Increase activity slowly    Complete by:  As directed            Follow-up Information   Follow up with , S, MD. Schedule an appointment as soon as possible for a visit in 2 weeks.   Specialty:  Neurosurgery   Contact information:   [address] [place]  [address] [phone]        Signed: Eustace Moore 09/05/2014, 3:44 PM

## 2014-09-11 ENCOUNTER — Emergency Department (HOSPITAL_COMMUNITY)
Admission: EM | Admit: 2014-09-11 | Discharge: 2014-09-11 | Discharge status: Against Medical Advice | Payer: Medicare Other

## 2014-09-11 ENCOUNTER — Encounter (HOSPITAL_COMMUNITY): Payer: Self-pay | Admitting: Emergency Medicine

## 2014-09-11 ENCOUNTER — Emergency Department (HOSPITAL_COMMUNITY)
Admission: EM | Admit: 2014-09-11 | Discharge: 2014-09-12 | Disposition: A | Discharge status: Home / Self Care | Payer: Medicare Other | Attending: Emergency Medicine | Admitting: Emergency Medicine

## 2014-09-11 DIAGNOSIS — E860 Dehydration: Secondary | ICD-10-CM | POA: Diagnosis not present

## 2014-09-11 DIAGNOSIS — E785 Hyperlipidemia, unspecified: Secondary | ICD-10-CM | POA: Diagnosis not present

## 2014-09-11 DIAGNOSIS — Z8546 Personal history of malignant neoplasm of prostate: Secondary | ICD-10-CM | POA: Diagnosis not present

## 2014-09-11 DIAGNOSIS — Z87891 Personal history of nicotine dependence: Secondary | ICD-10-CM | POA: Insufficient documentation

## 2014-09-11 DIAGNOSIS — I10 Essential (primary) hypertension: Secondary | ICD-10-CM | POA: Insufficient documentation

## 2014-09-11 DIAGNOSIS — Z86718 Personal history of other venous thrombosis and embolism: Secondary | ICD-10-CM | POA: Diagnosis not present

## 2014-09-11 DIAGNOSIS — Z79899 Other long term (current) drug therapy: Secondary | ICD-10-CM | POA: Insufficient documentation

## 2014-09-11 DIAGNOSIS — Z0000 Encounter for general adult medical examination without abnormal findings: Secondary | ICD-10-CM | POA: Diagnosis not present

## 2014-09-11 DIAGNOSIS — E119 Type 2 diabetes mellitus without complications: Secondary | ICD-10-CM | POA: Diagnosis not present

## 2014-09-11 DIAGNOSIS — Z86711 Personal history of pulmonary embolism: Secondary | ICD-10-CM | POA: Diagnosis not present

## 2014-09-11 DIAGNOSIS — M1990 Unspecified osteoarthritis, unspecified site: Secondary | ICD-10-CM | POA: Diagnosis not present

## 2014-09-11 LAB — COMPREHENSIVE METABOLIC PANEL
ALBUMIN: 3 g/dL — AB (ref 3.5–5.2)
ALT: 17 U/L (ref 0–53)
ANION GAP: 13 (ref 5–15)
AST: 11 U/L (ref 0–37)
Alkaline Phosphatase: 114 U/L (ref 39–117)
BUN: 18 mg/dL (ref 6–23)
CALCIUM: 9 mg/dL (ref 8.4–10.5)
CO2: 23 mEq/L (ref 19–32)
CREATININE: 1.03 mg/dL (ref 0.50–1.35)
Chloride: 98 mEq/L (ref 96–112)
GFR calc Af Amer: 85 mL/min — ABNORMAL LOW (ref >90)
GFR calc non Af Amer: 73 mL/min — ABNORMAL LOW (ref >90)
Glucose, Bld: 187 mg/dL — ABNORMAL HIGH (ref 70–99)
Potassium: 3.8 mEq/L (ref 3.7–5.3)
Sodium: 134 mEq/L — ABNORMAL LOW (ref 137–147)
TOTAL PROTEIN: 6.5 g/dL (ref 6.0–8.3)
Total Bilirubin: 0.7 mg/dL (ref 0.3–1.2)

## 2014-09-11 LAB — CBC WITH DIFFERENTIAL/PLATELET
Basophils Absolute: 0 10*3/uL (ref 0.0–0.1)
Basophils Relative: 0 % (ref 0–1)
EOS PCT: 1 % (ref 0–5)
Eosinophils Absolute: 0.1 10*3/uL (ref 0.0–0.7)
HEMATOCRIT: 38.8 % — AB (ref 39.0–52.0)
HEMOGLOBIN: 13.2 g/dL (ref 13.0–17.0)
LYMPHS ABS: 1.4 10*3/uL (ref 0.7–4.0)
LYMPHS PCT: 14 % (ref 12–46)
MCH: 30.9 pg (ref 26.0–34.0)
MCHC: 34 g/dL (ref 30.0–36.0)
MCV: 90.9 fL (ref 78.0–100.0)
MONO ABS: 0.7 10*3/uL (ref 0.1–1.0)
Monocytes Relative: 8 % (ref 3–12)
NEUTROS ABS: 7.4 10*3/uL (ref 1.7–7.7)
Neutrophils Relative %: 77 % (ref 43–77)
Platelets: 217 10*3/uL (ref 150–400)
RBC: 4.27 MIL/uL (ref 4.22–5.81)
RDW: 13.4 % (ref 11.5–15.5)
WBC: 9.7 10*3/uL (ref 4.0–10.5)

## 2014-09-11 LAB — URINALYSIS, ROUTINE W REFLEX MICROSCOPIC
BILIRUBIN URINE: NEGATIVE
GLUCOSE, UA: NEGATIVE mg/dL
Hgb urine dipstick: NEGATIVE
Ketones, ur: NEGATIVE mg/dL
Leukocytes, UA: NEGATIVE
Nitrite: NEGATIVE
PH: 5.5 (ref 5.0–8.0)
Protein, ur: NEGATIVE mg/dL
Specific Gravity, Urine: 1.029 (ref 1.005–1.030)
Urobilinogen, UA: 1 mg/dL (ref 0.0–1.0)

## 2014-09-11 LAB — PROTIME-INR
INR: 0.95 (ref 0.00–1.49)
Prothrombin Time: 12.7 seconds (ref 11.6–15.2)

## 2014-09-11 NOTE — ED Provider Notes (Signed)
CSN: [id number]     Arrival date & time 09/11/14  2038 History   First MD Initiated Contact with Patient 09/11/14 2256     Chief Complaint  Patient presents with  . Dehydration     (Consider location/radiation/quality/duration/timing/severity/associated sxs/prior Treatment) HPI [name] Hall is a 68 y.o. male with past medical history of recent intracranial hemorrhage coming in for dehydration. Patient was at a followup appointment with his primary care physician, who stated that he appears dehydrated. He took orthostatic vital signs are for him to the emergency department for IV fluids. Patient states she recently fell 2 weeks ago and was on anticoagulation for pulmonary embolism. He had a stable CT of his head and was discharged approximately one week ago. He does have residual headache and back pain both of which have improved in the last week. He's had normal appetite and denies any nausea or vomiting. He states he only had 3 loose stools today. He does not feel dehydrated, he states he only presents emergency department because he was sent here by his primary care physician.   He's denying any neurological symptoms, blurry vision, muscle weakness, numbness or tingling. Patient has no further complaints.  10 Systems reviewed and are negative for acute change except as noted in the HPI.     Past Medical History  Diagnosis Date  . Diabetes mellitus   . DVT (deep venous thrombosis)   . PE (pulmonary embolism)   . Prostate ca   . Orthostatic hypotension   . PROSTATE CANCER   . PERSONAL HISTORY, VENOUS THROMBOSIS AND EMBOLISM   . Hypertension   . Hyperlipidemia   . Osteoarthritis    Past Surgical History  Procedure Laterality Date  . Total hip arthroplasty    . Tonsillectomy and adenoidectomy      AS A CHILD  . Colonoscopy with propofol  10/2003   Family History  Problem Relation Age of Onset  . Heart attack Father   . Depression Sister   . Hyperlipidemia Mother   .  Hyperlipidemia Father   . Hypertension Mother   . Hypertension Father   . Sudden death Father    History  Substance Use Topics  . Smoking status: Former Smoker -- 1.00 packs/day for 30 years    Types: Cigarettes  . Smokeless tobacco: Never Used  . Alcohol Use: Yes     Comment: occasionally    Review of Systems    Allergies  Altace and Sulfonamide derivatives  Home Medications   Prior to Admission medications   Medication Sig Start Date End Date Taking? Authorizing Provider  acetaminophen (TYLENOL) 500 MG tablet Take 500-1,000 mg by mouth every 6 (six) hours as needed for mild pain or moderate pain.   Yes Historical Provider, MD  atorvastatin (LIPITOR) 80 MG tablet Take 80 mg by mouth at bedtime.   Yes Historical Provider, MD  lithium carbonate 300 MG capsule Take 300 mg by mouth 2 (two) times daily.    Yes Historical Provider, MD  metFORMIN (GLUCOPHAGE) 500 MG tablet Take 500 mg by mouth at bedtime.   Yes Historical Provider, MD  oxyCODONE-acetaminophen (PERCOCET/ROXICET) 5-325 MG per tablet Take 1-2 tablets by mouth every 4 (four) hours as needed for moderate pain. 09/01/14  Yes Eustace Moore, MD  traZODone (DESYREL) 100 MG tablet Take 100 mg by mouth at bedtime.   Yes Historical Provider, MD  methylPREDNIsolone (MEDROL DOSPACK) 4 MG tablet follow package directions 09/05/14   Eustace Moore, MD   BP  101/66  Pulse 70  Temp(Src) 97.8 F (36.6 C) (Oral)  Resp 14  SpO2 99% Physical Exam  Nursing note and vitals reviewed. Constitutional: He is oriented to person, place, and time. Vital signs are normal. He appears well-developed and well-nourished.  Non-toxic appearance. He does not appear ill. No distress.  HENT:  Head: Normocephalic and atraumatic.  Nose: Nose normal.  Mouth/Throat: Oropharynx is clear and moist. No oropharyngeal exudate.  Eyes: Conjunctivae and EOM are normal. Pupils are equal, round, and reactive to light. No scleral icterus.  Neck: Normal range of  motion. Neck supple. No tracheal deviation, no edema, no erythema and normal range of motion present. No mass and no thyromegaly present.  Cardiovascular: Normal rate, regular rhythm, S1 normal, S2 normal, normal heart sounds, intact distal pulses and normal pulses.  Exam reveals no gallop and no friction rub.   No murmur heard. Pulses:      Radial pulses are 2+ on the right side, and 2+ on the left side.       Dorsalis pedis pulses are 2+ on the right side, and 2+ on the left side.  Pulmonary/Chest: Effort normal and breath sounds normal. No respiratory distress. He has no wheezes. He has no rhonchi. He has no rales.  Abdominal: Soft. Normal appearance and bowel sounds are normal. He exhibits no distension, no ascites and no mass. There is no hepatosplenomegaly. There is no tenderness. There is no rebound, no guarding and no CVA tenderness.  Musculoskeletal: Normal range of motion. He exhibits no edema and no tenderness.  Lymphadenopathy:    He has no cervical adenopathy.  Neurological: He is alert and oriented to person, place, and time. He has normal strength. No cranial nerve deficit or sensory deficit. He exhibits normal muscle tone. Coordination normal. GCS eye subscore is 4. GCS verbal subscore is 5. GCS motor subscore is 6.  Normal strength and sensation x4 extremities. Normal cerebellar testing.  Skin: Skin is warm, dry and intact. No petechiae and no rash noted. He is not diaphoretic. No erythema. No pallor.  Psychiatric: He has a normal mood and affect. His behavior is normal. Judgment normal.    ED Course  Procedures (including critical care time) Labs Review Labs Reviewed  COMPREHENSIVE METABOLIC PANEL - Abnormal; Notable for the following:    Sodium 134 (*)    Glucose, Bld 187 (*)    Albumin 3.0 (*)    GFR calc non Af Amer 73 (*)    GFR calc Af Amer 85 (*)    All other components within normal limits  CBC WITH DIFFERENTIAL - Abnormal; Notable for the following:    HCT 38.8  (*)    All other components within normal limits  URINALYSIS, ROUTINE W REFLEX MICROSCOPIC - Abnormal; Notable for the following:    Color, Urine AMBER (*)    All other components within normal limits    Imaging Review No results found.   EKG Interpretation None      MDM   Final diagnoses:  None    Patient presents emergency department sent by primary care physician for dehydration. Patient's history is not consistent with this, he had normal appetite without any nausea vomiting or diarrhea. Laboratory studies here did not reflect dehydration. He was already giving 1 L of fluid when I walked in the room. Will finish this bolus and patient will be safe for discharge.   Patient was advised to followup at his regular scheduled appointment.    Everlene Balls,  MD 09/11/14 2322

## 2014-09-11 NOTE — ED Notes (Signed)
Bed: ML54 Expected date:  Expected time:  Means of arrival:  Comments: Sickle cell

## 2014-09-11 NOTE — ED Notes (Signed)
Pt being sent by Jacelyn Grip MD.  C/o dizziness x 6 days.  MD reports Pt had a fall and intracranial bleeding x 2 weeks ago and has been admitted to [facility] twice.  Pt does not think dizziness is related to bleed.  Sts "I think, it's just fluid depletion."

## 2014-09-11 NOTE — ED Notes (Addendum)
Pt fell 2 weeks ago. Has been seen several times since then. Initially admitted for ICH. Went to PCP today for pain control of lower back. Admits to continued HA. While at PCP today pt was informed that he was dehydrated and needed to come to ED. Alert, NAD, calm, interactive. Describes orthostasis. Here with wife. No labwork done today.  Was taken off of coumadin at time of fall, not sure when it will be restarted or changed. C/o back pain, HA, dizziness and some sob. Denies nausea. Last ate PTA. Last BM today (3 small), taking percocet, no stool softener, also h/o DM, last CBG 136 at ~1500. Admits to voiding a lot today.

## 2014-09-11 NOTE — Discharge Instructions (Signed)
Concussion [name] Hall, [place] were seen today for evaluation of dehydration. Your history does not support dehydration in your lab values are normal. Continue to maintain a healthy appetite and followup with her primary care physician within the next week. If any of her symptoms worsen or he develop muscle weakness or numbness return to emergency department immediately for repeat evaluation. Thank you. A concussion is a brain injury. It is caused by:  A hit to the head.  A quick and sudden movement (jolt) of the head or neck. A concussion is usually not life threatening. Even so, it can cause serious problems. If you had a concussion before, you may have concussion-like problems after a hit to your head. HOME CARE General Instructions  Follow your doctor's directions carefully.  Take medicines only as told by your doctor.  Only take medicines your doctor says are safe.  Do not drink alcohol until your doctor says it is okay. Alcohol and some drugs can slow down healing. They can also put you at risk for further injury.  If you are having trouble remembering things, write them down.  Try to do one thing at a time if you get distracted easily. For example, do not watch TV while making dinner.  Talk to your family members or close friends when making important decisions.  Follow up with your doctor as told.  Watch your symptoms. Tell others to do the same. Serious problems can sometimes happen after a concussion. Older adults are more likely to have these problems.  Tell your teachers, school nurse, school counselor, coach, Product/process development scientist, or work Freight forwarder about your concussion. Tell them about what you can or cannot do. They should watch to see if:  It gets even harder for you to pay attention or concentrate.  It gets even harder for you to remember things or learn new things.  You need more time than normal to finish things.  You become annoyed (irritable) more than before.  You  are not able to deal with stress as well.  You have more problems than before.  Rest. Make sure you:  Get plenty of sleep at night.  Go to sleep early.  Go to bed at the same time every day. Try to wake up at the same time.  Rest during the day.  Take naps when you feel tired.  Limit activities where you have to think a lot or concentrate. These include:  Doing homework.  Doing work related to a job.  Watching TV.  Using the computer. Returning To Your Regular Activities Return to your normal activities slowly, not all at once. You must give your body and brain enough time to heal.   Do not play sports or do other athletic activities until your doctor says it is okay.  Ask your doctor when you can drive, ride a bicycle, or work other vehicles or machines. Never do these things if you feel dizzy.  Ask your doctor about when you can return to work or school. Preventing Another Concussion It is very important to avoid another brain injury, especially before you have healed. In rare cases, another injury can lead to permanent brain damage, brain swelling, or death. The risk of this is greatest during the first 7-10 days after your injury. Avoid injuries by:   Wearing a seat belt when riding in a car.  Not drinking too much alcohol.  Avoiding activities that could lead to a second concussion (such as contact sports).  Wearing  a helmet when doing activities like:  Biking.  Skiing.  Skateboarding.  Skating.  Making your home safer by:  Removing things from the floor or stairways that could make you trip.  Using grab bars in bathrooms and handrails by stairs.  Placing non-slip mats on floors and in bathtubs.  Improve lighting in dark areas. GET HELP IF:  It gets even harder for you to pay attention or concentrate.  It gets even harder for you to remember things or learn new things.  You need more time than normal to finish things.  You become annoyed  (irritable) more than before.  You are not able to deal with stress as well.  You have more problems than before.  You have problems keeping your balance.  You are not able to react quickly when you should. Get help if you have any of these problems for more than 2 weeks:   Lasting (chronic) headaches.  Dizziness or trouble balancing.  Feeling sick to your stomach (nausea).  Seeing (vision) problems.  Being affected by noises or light more than normal.  Feeling sad, low, down in the dumps, blue, gloomy, or empty (depressed).  Mood changes (mood swings).  Feeling of fear or nervousness about what may happen (anxiety).  Feeling annoyed.  Memory problems.  Problems concentrating or paying attention.  Sleep problems.  Feeling tired all the time. GET HELP RIGHT AWAY IF:   You have bad headaches or your headaches get worse.  You have weakness (even if it is in one hand, leg, or part of the face).  You have loss of feeling (numbness).  You feel off balance.  You keep throwing up (vomiting).  You feel tired.  One black center of your eye (pupil) is larger than the other.  You twitch or shake violently (convulse).  Your speech is not clear (slurred).  You are more confused, easily angered (agitated), or annoyed than before.  You have more trouble resting than before.  You are unable to recognize people or places.  You have neck pain.  It is difficult to wake you up.  You have unusual behavior changes.  You pass out (lose consciousness). MAKE SURE YOU:   Understand these instructions.  Will watch your condition.  Will get help right away if you are not doing well or get worse. Document Released: 11/04/2009 Document Revised: 04/02/2014 Document Reviewed: 06/08/2013 [facility] Patient Information 2015 [place], [place]. This information is not intended to replace advice given to you by your health care provider. Make sure you discuss any questions you have  with your health care provider.

## 2014-09-24 ENCOUNTER — Other Ambulatory Visit: Payer: Self-pay | Admitting: Family Medicine

## 2014-09-24 DIAGNOSIS — I629 Nontraumatic intracranial hemorrhage, unspecified: Secondary | ICD-10-CM

## 2014-09-25 ENCOUNTER — Other Ambulatory Visit: Payer: Self-pay | Admitting: Family Medicine

## 2014-09-25 DIAGNOSIS — Z87891 Personal history of nicotine dependence: Secondary | ICD-10-CM

## 2014-09-26 ENCOUNTER — Ambulatory Visit
Admission: RE | Admit: 2014-09-26 | Discharge: 2014-09-26 | Disposition: A | Discharge status: Home / Self Care | Payer: Medicare Other | Source: Ambulatory Visit | Attending: Family Medicine | Admitting: Family Medicine

## 2014-09-26 DIAGNOSIS — Z87891 Personal history of nicotine dependence: Secondary | ICD-10-CM

## 2014-09-26 DIAGNOSIS — I629 Nontraumatic intracranial hemorrhage, unspecified: Secondary | ICD-10-CM

## 2014-09-27 ENCOUNTER — Other Ambulatory Visit: Payer: Self-pay | Admitting: Family Medicine

## 2014-09-27 DIAGNOSIS — Z87891 Personal history of nicotine dependence: Secondary | ICD-10-CM

## 2014-09-28 ENCOUNTER — Ambulatory Visit
Admission: RE | Admit: 2014-09-28 | Discharge: 2014-09-28 | Disposition: A | Discharge status: Home / Self Care | Payer: Medicare Other | Source: Ambulatory Visit | Attending: Family Medicine | Admitting: Family Medicine

## 2014-09-28 DIAGNOSIS — Z87891 Personal history of nicotine dependence: Secondary | ICD-10-CM

## 2014-11-14 ENCOUNTER — Inpatient Hospital Stay (HOSPITAL_COMMUNITY)
Admission: EM | Admit: 2014-11-14 | Discharge: 2014-11-16 | DRG: 301 | Disposition: A | Discharge status: Home / Self Care | Payer: Medicare Other | Attending: Internal Medicine | Admitting: Internal Medicine

## 2014-11-14 ENCOUNTER — Encounter (HOSPITAL_COMMUNITY): Payer: Self-pay | Admitting: Emergency Medicine

## 2014-11-14 DIAGNOSIS — Z792 Long term (current) use of antibiotics: Secondary | ICD-10-CM | POA: Diagnosis not present

## 2014-11-14 DIAGNOSIS — Z86711 Personal history of pulmonary embolism: Secondary | ICD-10-CM

## 2014-11-14 DIAGNOSIS — Z87828 Personal history of other (healed) physical injury and trauma: Secondary | ICD-10-CM | POA: Diagnosis not present

## 2014-11-14 DIAGNOSIS — M79609 Pain in unspecified limb: Secondary | ICD-10-CM

## 2014-11-14 DIAGNOSIS — M1990 Unspecified osteoarthritis, unspecified site: Secondary | ICD-10-CM | POA: Diagnosis present

## 2014-11-14 DIAGNOSIS — E785 Hyperlipidemia, unspecified: Secondary | ICD-10-CM | POA: Diagnosis present

## 2014-11-14 DIAGNOSIS — I82409 Acute embolism and thrombosis of unspecified deep veins of unspecified lower extremity: Secondary | ICD-10-CM | POA: Diagnosis present

## 2014-11-14 DIAGNOSIS — Z8546 Personal history of malignant neoplasm of prostate: Secondary | ICD-10-CM

## 2014-11-14 DIAGNOSIS — E663 Overweight: Secondary | ICD-10-CM | POA: Diagnosis present

## 2014-11-14 DIAGNOSIS — S060XAA Concussion with loss of consciousness status unknown, initial encounter: Secondary | ICD-10-CM | POA: Diagnosis present

## 2014-11-14 DIAGNOSIS — Z79899 Other long term (current) drug therapy: Secondary | ICD-10-CM | POA: Diagnosis not present

## 2014-11-14 DIAGNOSIS — E119 Type 2 diabetes mellitus without complications: Secondary | ICD-10-CM | POA: Diagnosis present

## 2014-11-14 DIAGNOSIS — Z888 Allergy status to other drugs, medicaments and biological substances status: Secondary | ICD-10-CM | POA: Diagnosis not present

## 2014-11-14 DIAGNOSIS — Z96643 Presence of artificial hip joint, bilateral: Secondary | ICD-10-CM | POA: Diagnosis present

## 2014-11-14 DIAGNOSIS — Z87891 Personal history of nicotine dependence: Secondary | ICD-10-CM

## 2014-11-14 DIAGNOSIS — M7989 Other specified soft tissue disorders: Secondary | ICD-10-CM | POA: Diagnosis not present

## 2014-11-14 DIAGNOSIS — I82402 Acute embolism and thrombosis of unspecified deep veins of left lower extremity: Principal | ICD-10-CM

## 2014-11-14 DIAGNOSIS — I10 Essential (primary) hypertension: Secondary | ICD-10-CM | POA: Diagnosis present

## 2014-11-14 DIAGNOSIS — Z882 Allergy status to sulfonamides status: Secondary | ICD-10-CM

## 2014-11-14 DIAGNOSIS — Z6826 Body mass index (BMI) 26.0-26.9, adult: Secondary | ICD-10-CM

## 2014-11-14 DIAGNOSIS — R609 Edema, unspecified: Secondary | ICD-10-CM

## 2014-11-14 DIAGNOSIS — Z86718 Personal history of other venous thrombosis and embolism: Secondary | ICD-10-CM

## 2014-11-14 DIAGNOSIS — C61 Malignant neoplasm of prostate: Secondary | ICD-10-CM | POA: Diagnosis present

## 2014-11-14 DIAGNOSIS — S060X9A Concussion with loss of consciousness of unspecified duration, initial encounter: Secondary | ICD-10-CM | POA: Diagnosis present

## 2014-11-14 LAB — CBC WITH DIFFERENTIAL/PLATELET
BASOS ABS: 0 10*3/uL (ref 0.0–0.1)
Basophils Relative: 0 % (ref 0–1)
Eosinophils Absolute: 0.1 10*3/uL (ref 0.0–0.7)
Eosinophils Relative: 2 % (ref 0–5)
HEMATOCRIT: 37.2 % — AB (ref 39.0–52.0)
Hemoglobin: 11.7 g/dL — ABNORMAL LOW (ref 13.0–17.0)
Lymphocytes Relative: 13 % (ref 12–46)
Lymphs Abs: 0.9 10*3/uL (ref 0.7–4.0)
MCH: 29.8 pg (ref 26.0–34.0)
MCHC: 31.5 g/dL (ref 30.0–36.0)
MCV: 94.7 fL (ref 78.0–100.0)
MONO ABS: 1 10*3/uL (ref 0.1–1.0)
Monocytes Relative: 15 % — ABNORMAL HIGH (ref 3–12)
NEUTROS ABS: 4.7 10*3/uL (ref 1.7–7.7)
Neutrophils Relative %: 70 % (ref 43–77)
PLATELETS: 172 10*3/uL (ref 150–400)
RBC: 3.93 MIL/uL — ABNORMAL LOW (ref 4.22–5.81)
RDW: 12.6 % (ref 11.5–15.5)
WBC: 6.7 10*3/uL (ref 4.0–10.5)

## 2014-11-14 LAB — PROTIME-INR
INR: 1.08 (ref 0.00–1.49)
Prothrombin Time: 14.1 seconds (ref 11.6–15.2)

## 2014-11-14 LAB — BASIC METABOLIC PANEL
ANION GAP: 12 (ref 5–15)
BUN: 12 mg/dL (ref 6–23)
CHLORIDE: 99 meq/L (ref 96–112)
CO2: 25 meq/L (ref 19–32)
Calcium: 10 mg/dL (ref 8.4–10.5)
Creatinine, Ser: 1.02 mg/dL (ref 0.50–1.35)
GFR calc Af Amer: 85 mL/min — ABNORMAL LOW (ref >90)
GFR calc non Af Amer: 73 mL/min — ABNORMAL LOW (ref >90)
Glucose, Bld: 110 mg/dL — ABNORMAL HIGH (ref 70–99)
Potassium: 4.1 mEq/L (ref 3.7–5.3)
SODIUM: 136 meq/L — AB (ref 137–147)

## 2014-11-14 LAB — GLUCOSE, CAPILLARY: GLUCOSE-CAPILLARY: 137 mg/dL — AB (ref 70–99)

## 2014-11-14 LAB — HEPARIN LEVEL (UNFRACTIONATED): Heparin Unfractionated: <0.1 IU/mL — ABNORMAL LOW (ref 0.30–0.70)

## 2014-11-14 LAB — CBG MONITORING, ED: Glucose-Capillary: 159 mg/dL — ABNORMAL HIGH (ref 70–99)

## 2014-11-14 LAB — APTT: aPTT: 35 seconds (ref 24–37)

## 2014-11-14 MED ORDER — SODIUM CHLORIDE 0.9 % IV SOLN
INTRAVENOUS | Status: DC
  Administered 2014-11-14 – 2014-11-15 (×2): via INTRAVENOUS

## 2014-11-14 MED ORDER — ALUM & MAG HYDROXIDE-SIMETH 200-200-20 MG/5ML PO SUSP: 30.0000 mL | Freq: Four times a day (QID) | ORAL | Status: DC | PRN

## 2014-11-14 MED ORDER — ONDANSETRON HCL 4 MG/2ML IJ SOLN: 4.0000 mg | Freq: Four times a day (QID) | INTRAMUSCULAR | Status: DC | PRN

## 2014-11-14 MED ORDER — HYDROMORPHONE HCL 1 MG/ML IJ SOLN: 0.5000 mg | INTRAMUSCULAR | Status: DC | PRN

## 2014-11-14 MED ORDER — INSULIN ASPART 100 UNIT/ML ~~LOC~~ SOLN: 0.0000 [IU] | Freq: Every day | SUBCUTANEOUS | Status: DC

## 2014-11-14 MED ORDER — HEPARIN BOLUS VIA INFUSION
2800.0000 [IU] | Freq: Once | INTRAVENOUS | Status: AC
  Administered 2014-11-14: 2800 [IU] via INTRAVENOUS
  Filled 2014-11-14: qty 2800

## 2014-11-14 MED ORDER — OXYCODONE HCL 5 MG PO TABS
5.0000 mg | ORAL_TABLET | ORAL | Status: DC | PRN
  Administered 2014-11-14 – 2014-11-16 (×3): 5 mg via ORAL
  Filled 2014-11-14 (×3): qty 1

## 2014-11-14 MED ORDER — ATORVASTATIN CALCIUM 80 MG PO TABS
80.0000 mg | ORAL_TABLET | Freq: Every day | ORAL | Status: DC
  Administered 2014-11-14 – 2014-11-15 (×2): 80 mg via ORAL
  Filled 2014-11-14 (×2): qty 1

## 2014-11-14 MED ORDER — ACETAMINOPHEN 650 MG RE SUPP: 650.0000 mg | Freq: Four times a day (QID) | RECTAL | Status: DC | PRN

## 2014-11-14 MED ORDER — LITHIUM CARBONATE 300 MG PO CAPS
300.0000 mg | ORAL_CAPSULE | Freq: Two times a day (BID) | ORAL | Status: DC
  Administered 2014-11-14 – 2014-11-16 (×4): 300 mg via ORAL
  Filled 2014-11-14 (×4): qty 1

## 2014-11-14 MED ORDER — HEPARIN (PORCINE) IN NACL 100-0.45 UNIT/ML-% IJ SOLN
1600.0000 [IU]/h | INTRAMUSCULAR | Status: DC
  Administered 2014-11-14 – 2014-11-15 (×2): 1600 [IU]/h via INTRAVENOUS
  Filled 2014-11-14 (×3): qty 250

## 2014-11-14 MED ORDER — ONDANSETRON HCL 4 MG PO TABS: 4.0000 mg | ORAL_TABLET | Freq: Four times a day (QID) | ORAL | Status: DC | PRN

## 2014-11-14 MED ORDER — ACETAMINOPHEN 325 MG PO TABS: 650.0000 mg | ORAL_TABLET | Freq: Four times a day (QID) | ORAL | Status: DC | PRN

## 2014-11-14 MED ORDER — INSULIN ASPART 100 UNIT/ML ~~LOC~~ SOLN
0.0000 [IU] | Freq: Three times a day (TID) | SUBCUTANEOUS | Status: DC
  Administered 2014-11-16: 2 [IU] via SUBCUTANEOUS

## 2014-11-14 MED ORDER — TRAZODONE HCL 100 MG PO TABS
100.0000 mg | ORAL_TABLET | Freq: Every day | ORAL | Status: DC
  Administered 2014-11-14 – 2014-11-15 (×2): 100 mg via ORAL
  Filled 2014-11-14 (×2): qty 1
  Filled 2014-11-14 (×2): qty 2

## 2014-11-14 MED ORDER — INFLUENZA VAC SPLIT QUAD 0.5 ML IM SUSY
0.5000 mL | PREFILLED_SYRINGE | INTRAMUSCULAR | Status: AC
  Administered 2014-11-15: 0.5 mL via INTRAMUSCULAR
  Filled 2014-11-14 (×2): qty 0.5

## 2014-11-14 MED ORDER — SODIUM CHLORIDE 0.9 % IJ SOLN: 3.0000 mL | Freq: Two times a day (BID) | INTRAMUSCULAR | Status: DC

## 2014-11-14 NOTE — ED Notes (Signed)
Pt alert, arrives from home, c/o swelling to left leg, onset a few days ago, pt has hx of DVT, was recently taken of Warfarin d/t concussion, pt ambulates to triage

## 2014-11-14 NOTE — Progress Notes (Signed)
.  VASCULAR LAB PRELIMINARY  PRELIMINARY  PRELIMINARY  PRELIMINARY  Left lower extremity venous duplex completed.    Preliminary report:  Extensive occlusive deep vein thrombosis of the left lower extremity coursing from the distal popliteal vein through the femoral, and common femoral veins. No obvious evidence of  Left lower extremity superficial thrombosis or Baker's cyst. There is no propagation to the right side at this time.  , , RVS 11/14/2014, 8:30 PM

## 2014-11-14 NOTE — ED Provider Notes (Signed)
CSN: [id number]     Arrival date & time 11/14/14  1836 History   First MD Initiated Contact with Patient 11/14/14 1915     Chief Complaint  Patient presents with  . Leg Swelling    Left, onset was a few days ago, hx blood clot     (Consider location/radiation/quality/duration/timing/severity/associated sxs/prior Treatment) HPI Comments: Patient presents to the ER for evaluation of pain and swelling of the left leg. Patient reports that he started to noticed pain in the left leg while he was walking yesterday. Since then he has had progressive worsening swelling and some pain of the left calf area. 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 He was on chronic anticoagulation until he had a fall 2 months ago and had a small head bleed. At that time he was taken off of the anticoagulation and has not been put back on it. Patient not experiencing any chest pain or shortness of breath.   Past Medical History  Diagnosis Date  . Diabetes mellitus   . DVT (deep venous thrombosis)   . PE (pulmonary embolism)   . Prostate ca   . Orthostatic hypotension   . PROSTATE CANCER   . PERSONAL HISTORY, VENOUS THROMBOSIS AND EMBOLISM   . Hypertension   . Hyperlipidemia   . Osteoarthritis    Past Surgical History  Procedure Laterality Date  . Total hip arthroplasty    . Tonsillectomy and adenoidectomy      AS A CHILD  . Colonoscopy with propofol  10/2003   Family History  Problem Relation Age of Onset  . Heart attack Father   . Depression Sister   . Hyperlipidemia Mother   . Hyperlipidemia Father   . Hypertension Mother   . Hypertension Father   . Sudden death Father    History  Substance Use Topics  . Smoking status: Former Smoker -- 1.00 packs/day for 30 years    Types: Cigarettes  . Smokeless tobacco: Never Used  . Alcohol Use: Yes     Comment: occasionally    Review of Systems  Musculoskeletal:       Leg pain      Allergies  Altace and Sulfonamide  derivatives  Home Medications   Prior to Admission medications   Medication Sig Start Date End Date Taking? Authorizing Provider  amoxicillin (AMOXIL) 500 MG tablet Take 500 mg by mouth 2 (two) times daily.   Yes Historical Provider, MD  atorvastatin (LIPITOR) 80 MG tablet Take 80 mg by mouth at bedtime.   Yes Historical Provider, MD  ibuprofen (ADVIL,MOTRIN) 200 MG tablet Take 400 mg by mouth every 6 (six) hours as needed for moderate pain (pain).   Yes Historical Provider, MD  lithium carbonate 300 MG capsule Take 300 mg by mouth 2 (two) times daily.    Yes Historical Provider, MD  metFORMIN (GLUCOPHAGE) 500 MG tablet Take 500 mg by mouth daily.    Yes Historical Provider, MD  traZODone (DESYREL) 100 MG tablet Take 100 mg by mouth at bedtime.   Yes Historical Provider, MD  acetaminophen (TYLENOL) 500 MG tablet Take 500-1,000 mg by mouth every 6 (six) hours as needed for mild pain or moderate pain.    Historical Provider, MD  methylPREDNIsolone (MEDROL DOSPACK) 4 MG tablet follow package directions Patient not taking: Reported on 11/14/2014 09/05/14   Eustace Moore, MD  oxyCODONE-acetaminophen (PERCOCET/ROXICET) 5-325 MG per tablet Take 1-2 tablets by mouth every 4 (four) hours as needed  for moderate pain. 09/01/14   Eustace Moore, MD   BP 117/70 mmHg  Pulse 93  Temp(Src) 98 F (36.7 C) (Oral)  Resp 16  Wt 198 lb (89.812 kg)  SpO2 99% Physical Exam  Constitutional: He is oriented to person, place, and time. He appears well-developed and well-nourished. No distress.  HENT:  Head: Normocephalic and atraumatic.  Right Ear: Hearing normal.  Left Ear: Hearing normal.  Nose: Nose normal.  Mouth/Throat: Oropharynx is clear and moist and mucous membranes are normal.  Eyes: Conjunctivae and EOM are normal. Pupils are equal, round, and reactive to light.  Neck: Normal range of motion. Neck supple.  Cardiovascular: Regular rhythm, S1 normal and S2 normal.  Exam reveals no gallop and no friction  rub.   No murmur heard. Pulmonary/Chest: Effort normal and breath sounds normal. No respiratory distress. He exhibits no tenderness.  Abdominal: Soft. Normal appearance and bowel sounds are normal. There is no hepatosplenomegaly. There is no tenderness. There is no rebound, no guarding, no tenderness at McBurney's point and negative Murphy's sign. No hernia.  Musculoskeletal: Normal range of motion.       Legs: Neurological: He is alert and oriented to person, place, and time. He has normal strength. No cranial nerve deficit or sensory deficit. Coordination normal. GCS eye subscore is 4. GCS verbal subscore is 5. GCS motor subscore is 6.  Skin: Skin is warm, dry and intact. No rash noted. No cyanosis.  Psychiatric: He has a normal mood and affect. His speech is normal and behavior is normal. Thought content normal.  Nursing note and vitals reviewed.   ED Course  Procedures (including critical care time) Labs Review Labs Reviewed  CBC WITH DIFFERENTIAL  BASIC METABOLIC PANEL  PROTIME-INR  APTT    Imaging Review No results found.   EKG Interpretation None      MDM   Final diagnoses:  Personal history of venous thrombosis and embolism  DVT left leg  Patient presents to the ER for evaluation of pain and swelling of the left leg. Patient first noticed it yesterday, symptoms have worsened over the course of today. Examination reveals swollen and diffusely tender left leg with increased warmth but no erythema. With his history of DVT and recent cessation of anticoagulation, DVT was suspected. Venous duplex was performed and shows extensive clot through the left leg with minimal blood flow through the venous system.  Based on the fact that the patient has such extensive clot, as well as the fact that he recently had an intracerebral bleed, do not feel the patient is reasonable for initiation of outpatient treatment. I did briefly discuss his care with Dr. Ronnald Ramp, neurosurgery. He did  confirm that we could initiate anticoagulation this far out from the initial head bleed. Patient will be initiated on IV heparin which can be quickly reversed if there is any complication. He will be admitted to medicine.    Orpah Greek, MD 11/14/14 2111

## 2014-11-14 NOTE — Progress Notes (Signed)
ANTICOAGULATION CONSULT NOTE - Initial Consult  Pharmacy Consult for Heparin Indication: DVT  Allergies  Allergen Reactions  . Altace [Ramipril] Hives  . Sulfonamide Derivatives Hives and Itching    Patient Measurements: Height: 78 inches Weight: 198 lb (89.812 kg) Heparin Dosing Weight: 89.8 kg  Vital Signs: Temp: 98 F (36.7 C) (12/16 1857) Temp Source: Oral (12/16 1857) BP: 117/70 mmHg (12/16 1857) Pulse Rate: 93 (12/16 1857)  Labs: No results for input(s): HGB, HCT, PLT, APTT, LABPROT, INR, HEPARINUNFRC, CREATININE, CKTOTAL, CKMB, TROPONINI in the last 72 hours.  Estimated Creatinine Clearance: 87.2 mL/min (by C-G formula based on Cr of 1.03).   Medical History: Past Medical History  Diagnosis Date  . Diabetes mellitus   . DVT (deep venous thrombosis)   . PE (pulmonary embolism)   . Prostate ca   . Orthostatic hypotension   . PROSTATE CANCER   . PERSONAL HISTORY, VENOUS THROMBOSIS AND EMBOLISM   . Hypertension   . Hyperlipidemia   . Osteoarthritis     Medications:  Scheduled:  Infusions:  PRN:     Assessment: Pharmacy is consulted to dose IV heparin for this 68 year old male with an extensive DVT in the LLE.  Patient has a history of a DVT in the RLE for which he was on chronic anticoagulation with warfarin until approximately 2 months ago when he fell and had a small head bleed.  He had not been restarted anticoagulation.  Goal of Therapy:  Heparin level 0.3-0.7 units/ml Monitor platelets by anticoagulation protocol: Yes   Plan:   Heparin 2800 units IV bolus x 1  Heparin 1600 units/hr  Heparin level 6 hours after starting  Daily heparin level, CBC  Monitor closely for signs/symptoms of bleeding  Peggyann Juba, PharmD, BCPS Pager: [phone] 11/14/2014,9:23 PM

## 2014-11-14 NOTE — H&P (Signed)
[facility] Admission History and Physical       Deangleo Passage NKN:[id number] DOB: 08-21-1946 DOA: 11/14/2014  Referring physician: EDP PCP: Gennette Pac, MD  Specialists:   Chief Complaint: Left Leg Pain and Swelling  HPI: [name] Hall is a 68 y.o. male with a history of PE/DVTof RLE in the past and had been on Coumadin until he sustained a closed head injury and concussion 10 weeks ago and was discontinued form Coumadin Rx who  presents to the ED with complaints of LLE pain and swelling  And pain with increased warmth from the area x 2 days.   He reports that on Sunday 3 days ago he was walking up a hill and heard a "pop" in his  Left leg.   He noticed the pain and swelling the next day.   He denies having any Chest pain or SOB.  He was evaluated in the ED and an Venous duplex US was performed and revealed a DVT in the LLE.   The EDP discussed his case with the Neurosurgery and he was started on an IV heparin drip and referred for admission.     Review of Systems:  Constitutional: No Weight Loss, No Weight Gain, Night Sweats, Fevers, Chills, Dizziness, Fatigue, or Generalized Weakness HEENT: No Headaches, Difficulty Swallowing,Tooth/Dental Problems,Sore Throat,  No Sneezing, Rhinitis, Ear Ache, Nasal Congestion, or Post Nasal Drip,  Cardio-vascular:  No Chest pain, Orthopnea, PND, Edema in Lower Extremities, Anasarca, Dizziness, Palpitations  Resp: No Dyspnea, No DOE, No Productive Cough, No Non-Productive Cough, No Hemoptysis, No Wheezing.    GI: No Heartburn, Indigestion, Abdominal Pain, Nausea, Vomiting, Diarrhea, Hematemesis, Hematochezia, Melena, Change in Bowel Habits,  Loss of Appetite  GU: No Dysuria, Change in Color of Urine, No Urgency or Frequency, No Flank pain.  Musculoskeletal: +LLE  Pain or Swelling, No Decreased Range of Motion, No Back Pain.  Neurologic: No Syncope, No Seizures, Muscle Weakness, Paresthesia, Vision Disturbance or Loss, No Diplopia, No  Vertigo, No Difficulty Walking,  Skin: No Rash or Lesions. Psych: No Change in Mood or Affect, No Depression or Anxiety, No Memory loss, No Confusion, or Hallucinations   Past Medical History  Diagnosis Date  . Diabetes mellitus   . DVT (deep venous thrombosis)   . PE (pulmonary embolism)   . Prostate ca   . Orthostatic hypotension   . PROSTATE CANCER   . PERSONAL HISTORY, VENOUS THROMBOSIS AND EMBOLISM   . Hypertension   . Hyperlipidemia   . Osteoarthritis       Past Surgical History  Procedure Laterality Date  . Total hip arthroplasty      Left and right hip replacements  . Tonsillectomy and adenoidectomy      AS A CHILD  . Colonoscopy with propofol  10/2003       Prior to Admission medications   Medication Sig Start Date End Date Taking? Authorizing Provider  amoxicillin (AMOXIL) 500 MG tablet Take 500 mg by mouth 2 (two) times daily.   Yes Historical Provider, MD  atorvastatin (LIPITOR) 80 MG tablet Take 80 mg by mouth at bedtime.   Yes Historical Provider, MD  ibuprofen (ADVIL,MOTRIN) 200 MG tablet Take 400 mg by mouth every 6 (six) hours as needed for moderate pain (pain).   Yes Historical Provider, MD  lithium carbonate 300 MG capsule Take 300 mg by mouth 2 (two) times daily.    Yes Historical Provider, MD  metFORMIN (GLUCOPHAGE) 500 MG tablet Take 500 mg by mouth daily.  Yes Historical Provider, MD  traZODone (DESYREL) 100 MG tablet Take 100 mg by mouth at bedtime.   Yes Historical Provider, MD  acetaminophen (TYLENOL) 500 MG tablet Take 500-1,000 mg by mouth every 6 (six) hours as needed for mild pain or moderate pain.    Historical Provider, MD  methylPREDNIsolone (MEDROL DOSPACK) 4 MG tablet follow package directions Patient not taking: Reported on 11/14/2014 09/05/14   Eustace Moore, MD  oxyCODONE-acetaminophen (PERCOCET/ROXICET) 5-325 MG per tablet Take 1-2 tablets by mouth every 4 (four) hours as needed for moderate pain. 09/01/14   Eustace Moore, MD       Allergies  Allergen Reactions  . Altace [Ramipril] Hives  . Sulfonamide Derivatives Hives and Itching     Social History:  reports that he quit smoking about 8 months ago. His smoking use included Cigarettes. He has a 30 pack-year smoking history. He has never used smokeless tobacco. He reports that he drinks alcohol. He reports that he does not use illicit drugs.     Family History  Problem Relation Age of Onset  . Heart attack Father   . Depression Sister   . Hyperlipidemia Mother   . Hyperlipidemia Father   . Hypertension Mother   . Hypertension Father   . Sudden death Father        Physical Exam:  GEN:  Pleasant  68 y.o. male  examined  and in no acute distress; cooperative with exam Filed Vitals:   11/14/14 1857  BP: 117/70  Pulse: 93  Temp: 98 F (36.7 C)  TempSrc: Oral  Resp: 16  Weight: 89.812 kg (198 lb)  SpO2: 99%   Blood pressure 117/70, pulse 93, temperature 98 F (36.7 C), temperature source Oral, resp. rate 16, weight 89.812 kg (198 lb), SpO2 99 %. PSYCH: He is alert and oriented x4; does not appear anxious does not appear depressed; affect is normal HEENT: Normocephalic and Atraumatic, Mucous membranes pink; PERRLA; EOM intact; Fundi:  Benign;  No scleral icterus, Nares: Patent, Oropharynx: Clear, Fair Dentition,    Neck:  FROM, No Cervical Lymphadenopathy nor Thyromegaly or Carotid Bruit; No JVD; Breasts:: Not examined CHEST WALL: No tenderness CHEST: Normal respiration, clear to auscultation bilaterally HEART: Regular rate and rhythm; no murmurs rubs or gallops BACK: No kyphosis or scoliosis; No CVA tenderness ABDOMEN: Positive Bowel Sounds, Soft Non-Tender; No Masses, No Organomegaly. Rectal Exam: Not done EXTREMITIES: RLE: No Cyanosis, Clubbing, or Edema; No Ulcerations.  LLE + Calor and 2+ EDEMA and + Homan's sign Genitalia: not examined PULSES: 2+ and symmetric SKIN: Normal hydration no rash or ulceration CNS:  Alert and Oriented x 4,  Non Focal Deficits Vascular: pulses palpable throughout    Labs on Admission:  Basic Metabolic Panel:  Recent Labs Lab 11/14/14 2127  NA 136*  K 4.1  CL 99  CO2 25  GLUCOSE 110*  BUN 12  CREATININE 1.02  CALCIUM 10.0   Liver Function Tests: No results for input(s): AST, ALT, ALKPHOS, BILITOT, PROT, ALBUMIN in the last 168 hours. No results for input(s): LIPASE, AMYLASE in the last 168 hours. No results for input(s): AMMONIA in the last 168 hours. CBC:  Recent Labs Lab 11/14/14 2127  WBC 6.7  NEUTROABS 4.7  HGB 11.7*  HCT 37.2*  MCV 94.7  PLT 172   Cardiac Enzymes: No results for input(s): CKTOTAL, CKMB, CKMBINDEX, TROPONINI in the last 168 hours.  BNP (last 3 results)  Recent Labs  08/31/14 1256  PROBNP 340.4*  CBG: No results for input(s): GLUCAP in the last 168 hours.  Radiological Exams on Admission: No results found.   EKG: Independently reviewed.    Assessment/Plan:   68 y.o. male with   Principal Problem:   1.   DVT (deep venous thrombosis)   IV Heparin   Hypercoagulabvle Panel Pending   Active Problems:   2.   Closed head injury with concussion- Recent hx  10 weeks ago, was taken off Coumadin then     3.    Diabetes mellitus type 2 in Non-Obese   Cehck HbA1C   Hold Metformin Rx   SSI coverage PRN     4.    Hypertension   Monitor BPs       5.    Hyperlipidemia   Continue Atorvastatin Rx     6.    PROSTATE CANCER   Hx     7.    DVT Prophylaxis    Covered with IV Heparin at this time          Code Status:     FULL CODE  Family Communication:   No Family Present  Disposition Plan:       Inpatient/ Telemetry  Time spent:  [place] [facility] Pager [phone]   If [place] Please Contact the Day Rounding Team MD for [facility]  If 7PM-7AM, Please Contact Night-Floor Coverage  [email or web address] Password [email or web address] 11/14/2014, 10:44 PM

## 2014-11-15 DIAGNOSIS — I82402 Acute embolism and thrombosis of unspecified deep veins of left lower extremity: Secondary | ICD-10-CM | POA: Diagnosis not present

## 2014-11-15 LAB — CBC
HCT: 32.5 % — ABNORMAL LOW (ref 39.0–52.0)
Hemoglobin: 10.3 g/dL — ABNORMAL LOW (ref 13.0–17.0)
MCH: 30 pg (ref 26.0–34.0)
MCHC: 31.7 g/dL (ref 30.0–36.0)
MCV: 94.8 fL (ref 78.0–100.0)
Platelets: 162 10*3/uL (ref 150–400)
RBC: 3.43 MIL/uL — AB (ref 4.22–5.81)
RDW: 12.9 % (ref 11.5–15.5)
WBC: 6.5 10*3/uL (ref 4.0–10.5)

## 2014-11-15 LAB — BASIC METABOLIC PANEL
Anion gap: 11 (ref 5–15)
BUN: 14 mg/dL (ref 6–23)
CO2: 24 mEq/L (ref 19–32)
CREATININE: 1.09 mg/dL (ref 0.50–1.35)
Calcium: 9 mg/dL (ref 8.4–10.5)
Chloride: 101 mEq/L (ref 96–112)
GFR, EST AFRICAN AMERICAN: 79 mL/min — AB (ref >90)
GFR, EST NON AFRICAN AMERICAN: 68 mL/min — AB (ref >90)
GLUCOSE: 149 mg/dL — AB (ref 70–99)
POTASSIUM: 3.8 meq/L (ref 3.7–5.3)
Sodium: 136 mEq/L — ABNORMAL LOW (ref 137–147)

## 2014-11-15 LAB — PROTEIN C ACTIVITY: PROTEIN C ACTIVITY: 106 % (ref 75–133)

## 2014-11-15 LAB — GLUCOSE, CAPILLARY
GLUCOSE-CAPILLARY: 103 mg/dL — AB (ref 70–99)
GLUCOSE-CAPILLARY: 108 mg/dL — AB (ref 70–99)
GLUCOSE-CAPILLARY: 136 mg/dL — AB (ref 70–99)
Glucose-Capillary: 107 mg/dL — ABNORMAL HIGH (ref 70–99)

## 2014-11-15 LAB — HEMOGLOBIN A1C
Hgb A1c MFr Bld: 5.6 % (ref <5.7)
MEAN PLASMA GLUCOSE: 114 mg/dL (ref <117)

## 2014-11-15 LAB — HEPARIN LEVEL (UNFRACTIONATED)
HEPARIN UNFRACTIONATED: 0.49 [IU]/mL (ref 0.30–0.70)
HEPARIN UNFRACTIONATED: 0.43 [IU]/mL (ref 0.30–0.70)

## 2014-11-15 LAB — PROTEIN S ACTIVITY: Protein S Activity: 82 % (ref 69–129)

## 2014-11-15 LAB — PROTEIN C, TOTAL: Protein C, Total: 81 % (ref 72–160)

## 2014-11-15 LAB — ANTITHROMBIN III: AntiThromb III Func: 127 % — ABNORMAL HIGH (ref 75–120)

## 2014-11-15 MED ORDER — WARFARIN - PHARMACIST DOSING INPATIENT
Freq: Every day | Status: DC
Start: 2014-11-15 — End: 2014-11-16

## 2014-11-15 MED ORDER — ENOXAPARIN SODIUM 100 MG/ML ~~LOC~~ SOLN
1.0000 mg/kg | Freq: Once | SUBCUTANEOUS | Status: AC
  Administered 2014-11-15: 90 mg via SUBCUTANEOUS
  Filled 2014-11-15: qty 1

## 2014-11-15 MED ORDER — ENOXAPARIN SODIUM 100 MG/ML ~~LOC~~ SOLN
1.0000 mg/kg | Freq: Two times a day (BID) | SUBCUTANEOUS | Status: DC
  Administered 2014-11-16: 90 mg via SUBCUTANEOUS
  Filled 2014-11-15: qty 1

## 2014-11-15 MED ORDER — WARFARIN SODIUM 7.5 MG PO TABS
7.5000 mg | ORAL_TABLET | Freq: Once | ORAL | Status: AC
  Administered 2014-11-15: 7.5 mg via ORAL
  Filled 2014-11-15: qty 1

## 2014-11-15 NOTE — Progress Notes (Signed)
[facility] PROGRESS NOTE  [name] Hall VCB:[id number] DOB: Sep 02, 1946 DOA: 11/14/2014 PCP: Gennette Pac, MD  Assessment/Plan:  1. LLE DVT (deep venous thrombosis) IV Heparin; well tolerated                         Will start transition to lovenox and coumadin Hypercoagulable Panel Pending   2. Closed head injury with concussion- Recent hx 10 weeks ago, was taken off Coumadin at that time. ED discussed with neurosurgery doctor and given presentation of DVT and resolution of hemorrhagic contusion on follow CT head during 10/15; ok by neurosurgery to resume anticoagulation.    3. Diabetes mellitus type 2 in Non-Obese HbA1C 5.6 Hold Metformin while inpatient continue SSI    4. Hypertension -stable. Will continue current antihypertensive agents    5. Hyperlipidemia Continue Atorvastatin Rx    6. PROSTATE CANCER: continue outpatient follow up /surveillance by urology   Code Status:FULL Family Communication: no family at bedside Disposition Plan: home when medically stable   Consultants:  None   Procedures:  LE duplex: demonstrated acute LLE DVT.  Antibiotics:  None   HPI/Subjective: No CP, no SOB, no fever. Complaining of LLE pain.  Objective: Filed Vitals:   11/15/14 1408  BP: 126/70  Pulse: 86  Temp: 98.2 F (36.8 C)  Resp: 18    Intake/Output Summary (Last 24 hours) at 11/15/14 2102 Last data filed at 11/15/14 1941  Gross per 24 hour  Intake 1265.8 ml  Output   1150 ml  Net  115.8 ml   Filed Weights   11/14/14 1857 11/14/14 2315  Weight: 89.812 kg (198 lb) 88.27 kg (194 lb 9.6 oz)    Exam:   General:  NAD, afebrile, no CP or SOB. Patient reports LLE pain  Cardiovascular: regular rate, no rubs  or gallops  Respiratory: CTA bilaterally  Abdomen: soft, NT, ND, positive BS  Musculoskeletal: LLE swelling and pain with palpation   Data Reviewed: Basic Metabolic Panel:  Recent Labs Lab 11/14/14 2127 11/15/14 0410  NA 136* 136*  K 4.1 3.8  CL 99 101  CO2 25 24  GLUCOSE 110* 149*  BUN 12 14  CREATININE 1.02 1.09  CALCIUM 10.0 9.0   CBC:  Recent Labs Lab 11/14/14 2127 11/15/14 0410  WBC 6.7 6.5  NEUTROABS 4.7  --   HGB 11.7* 10.3*  HCT 37.2* 32.5*  MCV 94.7 94.8  PLT 172 162   BNP (last 3 results)  Recent Labs  08/31/14 1256  PROBNP 340.4*   CBG:  Recent Labs Lab 11/14/14 2245 11/14/14 2315 11/15/14 0750 11/15/14 1208 11/15/14 1706  GLUCAP 159* 137* 103* 107* 108*    Studies: No results found.  Scheduled Meds: . atorvastatin  80 mg Oral QHS  . insulin aspart  0-5 Units Subcutaneous QHS  . insulin aspart  0-9 Units Subcutaneous TID WC  . lithium carbonate  300 mg Oral BID  . sodium chloride  3 mL Intravenous Q12H  . traZODone  100 mg Oral QHS   Continuous Infusions: . sodium chloride 50 mL/hr at 11/15/14 1659  . heparin 1,600 Units/hr (11/15/14 1152)    Principal Problem:   DVT (deep venous thrombosis) Active Problems:   PROSTATE CANCER   Hyperlipidemia   Closed head injury with concussion   Diabetes mellitus type 2 in nonobese   Hypertension    Time spent: 30 minutes    Barton Dubois  [facility] Pager [phone]. If 7PM-7AM, please contact night-coverage  at [email or web address], password [facility] [address] 11/15/2014, 9:02 PM  LOS: 1 day

## 2014-11-15 NOTE — Progress Notes (Signed)
CARE MANAGEMENT NOTE 11/15/2014  Patient:  [name] Hall,[name] Hall   Account Number:  [id number]  Date Initiated:  11/15/2014  Documentation initiated by:  Dessa Phi  Subjective/Objective Assessment:   68 y/o m admitted w/l leg dvt.     Action/Plan:   From home.   Anticipated DC Date:  11/19/2014   Anticipated DC Plan:  [place]  CM consult      Choice offered to / List presented to:             Status of service:  In process, will continue to follow Medicare Important Message given?   (If response is "NO", the following Medicare IM given date fields will be blank) Date Medicare IM given:   Medicare IM given by:   Date Additional Medicare IM given:   Additional Medicare IM given by:    Discharge Disposition:    Per UR Regulation:  Reviewed for med. necessity/level of care/duration of stay  If discussed at [place] of Stay Meetings, dates discussed:    Comments:  11/15/14 Dessa Phi RN BSN NCM [phone] No anticipated d/c needs.

## 2014-11-15 NOTE — Progress Notes (Signed)
Nutrition Brief Note  Patient identified on the Malnutrition Screening Tool (MST) Report  Wt Readings from Last 15 Encounters:  11/14/14 194 lb 9.6 oz (88.27 kg)  09/03/14 198 lb (89.812 kg)  08/31/14 197 lb (89.359 kg)  10/17/13 217 lb (98.431 kg)  04/13/12 205 lb (92.987 kg)  12/18/11 205 lb (92.987 kg)  12/17/11 215 lb (97.523 kg)  10/07/10 220 lb 4 oz (99.905 kg)  09/02/10 225 lb (102.059 kg)    Body mass index is 26.39 kg/(m^2). Patient meets criteria for Overweight based on current BMI.   Current diet order is Heart Healthy/Carb Mod, patient is consuming approximately >50% of meals at this time. Labs and medications reviewed.   Pt reported hx of 20 lb unintentional wt loss that start around 02/2014. Denied any changes in appetite or PO intake. Has regained 6-8 lbs back. Noted some decreased appetite today, but normally eats very well at home. Does not utilize supplements or snacks.  No nutrition interventions warranted at this time. If nutrition issues arise, please consult RD.   [name] Abide MS RD LDN Clinical Dietitian [phone]

## 2014-11-15 NOTE — Progress Notes (Addendum)
ANTICOAGULATION CONSULT NOTE - Follow Up Consult  Pharmacy Consult for IV Heparin --> Lovenox / Coumadin bridge Indication: DVT  Allergies  Allergen Reactions  . Altace [Ramipril] Hives  . Sulfonamide Derivatives Hives and Itching    Patient Measurements: Height: 6' (182.9 cm) Weight: 194 lb 9.6 oz (88.27 kg) IBW/kg (Calculated) : 77.6 Heparin Dosing Weight:   Vital Signs: Temp: 98.2 F (36.8 C) (12/17 1408) Temp Source: Oral (12/17 1408) BP: 126/70 mmHg (12/17 1408) Pulse Rate: 86 (12/17 1408)  Labs:  Recent Labs  11/14/14 2127 11/14/14 2128 11/15/14 0410 11/15/14 0725 11/15/14 1408  HGB 11.7*  --  10.3*  --   --   HCT 37.2*  --  32.5*  --   --   PLT 172  --  162  --   --   APTT 35  --   --   --   --   LABPROT 14.1  --   --   --   --   INR 1.08  --   --   --   --   HEPARINUNFRC  --  <0.10*  --  0.49 0.43  CREATININE 1.02  --  1.09  --   --     Estimated Creatinine Clearance: 71.2 mL/min (by C-G formula based on Cr of 1.09).  Assessment: 32 yoM with extensive DVT in LLE on IV heparin.  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  Hypercoaguable panel in process. Neurosurgery ok with resuming chronic anticoagulation.  Pharmacy consulted to transition patient to lovenox / warfarin bridge.    Previous warfarin dose per medication history records: 5mg  daily except 10mg  on Tuesday and Friday (October 2015)  Baseline PT/INR: 14.1 / 1.08  CBC: Hgb low @ 10.3, plts WNL.  No s/sxs bleeding per RN.   Renal: SCr 1.09, stable, CrCl ~ 70 Wt = 88.3kg  Goal of Therapy:  INR 2-3 Monitor platelets by anticoagulation protocol: Yes   Plan:  Stop IV heparin and after 1 hour, start Lovenox 1mg /kg q12h  Warfarin 7.5mg  po x1 tonight Daily PT/INR, CBC  Ralene Bathe, PharmD, BCPS 11/15/2014, 9:36 PM  Pager: [phone]

## 2014-11-15 NOTE — Progress Notes (Signed)
ANTICOAGULATION CONSULT NOTE - Initial Consult  Pharmacy Consult for Heparin Indication: DVT  Allergies  Allergen Reactions  . Altace [Ramipril] Hives  . Sulfonamide Derivatives Hives and Itching    Patient Measurements: Height: 78 inches Height: 6' (182.9 cm) Weight: 194 lb 9.6 oz (88.27 kg) IBW/kg (Calculated) : 77.6 Heparin Dosing Weight: 89.8 kg  Vital Signs: Temp: 98.2 F (36.8 C) (12/17 1408) Temp Source: Oral (12/17 1408) BP: 126/70 mmHg (12/17 1408) Pulse Rate: 86 (12/17 1408)  Labs:  Recent Labs  11/14/14 2127 11/14/14 2128 11/15/14 0410 11/15/14 0725 11/15/14 1408  HGB 11.7*  --  10.3*  --   --   HCT 37.2*  --  32.5*  --   --   PLT 172  --  162  --   --   APTT 35  --   --   --   --   LABPROT 14.1  --   --   --   --   INR 1.08  --   --   --   --   HEPARINUNFRC  --  <0.10*  --  0.49 0.43  CREATININE 1.02  --  1.09  --   --     Estimated Creatinine Clearance: 71.2 mL/min (by C-G formula based on Cr of 1.09).   Medical History: Past Medical History  Diagnosis Date  . Diabetes mellitus   . DVT (deep venous thrombosis)   . PE (pulmonary embolism)   . Prostate ca   . Orthostatic hypotension   . PROSTATE CANCER   . PERSONAL HISTORY, VENOUS THROMBOSIS AND EMBOLISM   . Hypertension   . Hyperlipidemia   . Osteoarthritis     Medications:  Scheduled:  Infusions:  PRN: acetaminophen **OR** acetaminophen, alum & mag hydroxide-simeth, HYDROmorphone (DILAUDID) injection, ondansetron **OR** ondansetron (ZOFRAN) IV, oxyCODONE  Assessment: Pharmacy is consulted to dose IV heparin for this 68 year old male with an extensive DVT in the LLE.  Patient has a history of a DVT in the RLE for which he was on chronic anticoagulation with warfarin until approximately 2 months ago when he fell and had a small head bleed.  He had not been restarted anticoagulation.  2nd heparin level 0.43-therapeutic  Goal of Therapy:  Heparin level 0.3-0.7 units/ml Monitor  platelets by anticoagulation protocol: Yes   Plan:   Continue Heparin 1600 units/hr  Daily heparin level, CBC  Monitor closely for signs/symptoms of bleeding  Dolly Rias RPh 11/15/2014, 2:36 PM Pager [phone]

## 2014-11-16 DIAGNOSIS — I10 Essential (primary) hypertension: Secondary | ICD-10-CM | POA: Insufficient documentation

## 2014-11-16 DIAGNOSIS — S060X0D Concussion without loss of consciousness, subsequent encounter: Secondary | ICD-10-CM

## 2014-11-16 DIAGNOSIS — S069X0D Unspecified intracranial injury without loss of consciousness, subsequent encounter: Secondary | ICD-10-CM

## 2014-11-16 LAB — BASIC METABOLIC PANEL
ANION GAP: 10 (ref 5–15)
BUN: 11 mg/dL (ref 6–23)
CO2: 25 mEq/L (ref 19–32)
CREATININE: 1 mg/dL (ref 0.50–1.35)
Calcium: 9.1 mg/dL (ref 8.4–10.5)
Chloride: 104 mEq/L (ref 96–112)
GFR, EST AFRICAN AMERICAN: 87 mL/min — AB (ref >90)
GFR, EST NON AFRICAN AMERICAN: 75 mL/min — AB (ref >90)
Glucose, Bld: 106 mg/dL — ABNORMAL HIGH (ref 70–99)
Potassium: 4.2 mEq/L (ref 3.7–5.3)
Sodium: 139 mEq/L (ref 137–147)

## 2014-11-16 LAB — CBC
HCT: 31.7 % — ABNORMAL LOW (ref 39.0–52.0)
HEMOGLOBIN: 10.1 g/dL — AB (ref 13.0–17.0)
MCH: 29.9 pg (ref 26.0–34.0)
MCHC: 31.9 g/dL (ref 30.0–36.0)
MCV: 93.8 fL (ref 78.0–100.0)
PLATELETS: 149 10*3/uL — AB (ref 150–400)
RBC: 3.38 MIL/uL — ABNORMAL LOW (ref 4.22–5.81)
RDW: 12.7 % (ref 11.5–15.5)
WBC: 6.2 10*3/uL (ref 4.0–10.5)

## 2014-11-16 LAB — GLUCOSE, CAPILLARY
Glucose-Capillary: 169 mg/dL — ABNORMAL HIGH (ref 70–99)
Glucose-Capillary: 107 mg/dL — ABNORMAL HIGH (ref 70–99)

## 2014-11-16 LAB — PROTIME-INR
INR: 1.07 (ref 0.00–1.49)
Prothrombin Time: 14.1 seconds (ref 11.6–15.2)

## 2014-11-16 LAB — HEPARIN LEVEL (UNFRACTIONATED): HEPARIN UNFRACTIONATED: 0.36 [IU]/mL (ref 0.30–0.70)

## 2014-11-16 LAB — APC RESISTANCE: Activated Protein C Resistance: 1.6 ratio — ABNORMAL LOW (ref >=2.1)

## 2014-11-16 MED ORDER — WARFARIN SODIUM 7.5 MG PO TABS
7.5000 mg | ORAL_TABLET | Freq: Once | ORAL | Status: DC
  Filled 2014-11-16: qty 1

## 2014-11-16 MED ORDER — ENOXAPARIN SODIUM 100 MG/ML ~~LOC~~ SOLN: 1.0000 mg/kg | Freq: Two times a day (BID) | SUBCUTANEOUS | Status: DC

## 2014-11-16 MED ORDER — WARFARIN SODIUM 7.5 MG PO TABS: 7.5000 mg | ORAL_TABLET | Freq: Once | ORAL | Status: DC

## 2014-11-16 NOTE — Care Management Note (Signed)
    Page 1 of 1   11/16/2014     5:24:35 PM CARE MANAGEMENT NOTE 11/16/2014  Patient:  [name] Hall,[name] Hall   Account Number:  [id number]  Date Initiated:  11/15/2014  Documentation initiated by:  Dessa Phi  Subjective/Objective Assessment:   68 y/o m admitted w/l leg dvt.     Action/Plan:   From home.   Anticipated DC Date:  11/16/2014   Anticipated DC Plan:  [place]  CM consult      Choice offered to / List presented to:             Status of service:  Completed, signed off Medicare Important Message given?  YES (If response is "NO", the following Medicare IM given date fields will be blank) Date Medicare IM given:  11/16/2014 Medicare IM given by:  [facility] Date Additional Medicare IM given:   Additional Medicare IM given by:    Discharge Disposition:  HOME/SELF CARE  Per UR Regulation:  Reviewed for med. necessity/level of care/duration of stay  If discussed at [place] of Stay Meetings, dates discussed:    Comments:  11/16/14 Dessa Phi RN BSN NCM [phone] benefits checked for lovenox/generic-co pay $95.Md informed of pruor auth needed.  11/15/14 Dessa Phi RN BSN NCM [phone] No anticipated d/c needs.

## 2014-11-16 NOTE — Discharge Summary (Signed)
Physician Discharge Summary  [name] Hall PJA:[id number] DOB: [birth year] DOA: 11/14/2014  PCP: Gennette Pac, MD  Admit date: 11/14/2014 Discharge date: 11/16/2014  Time spent: >30 minutes  Recommendations for Outpatient Follow-up:  Check CBC to follow on Hgb level and platelets Check BMET to follow renal function Check INR and adjust coumadin dose. Patient will finish mandatory bridging therapy on 12/22 if coumadin level therapeutic.   Discharge Diagnoses:  Principal Problem:   DVT (deep venous thrombosis) Active Problems:   PROSTATE CANCER   Hyperlipidemia   Closed head injury with concussion   Diabetes mellitus type 2 in nonobese   Hypertension   Discharge Condition: stable and improved. Patient discharge home with prescription for lovenox and coumadin. Will follow with PCP on 12/21 for INR check up and further coumadin dose adjustments (goal INR 2-3)  Diet recommendation: heart healthy diet  Filed Weights   11/14/14 1857 11/14/14 2315  Weight: 89.812 kg (198 lb) 88.27 kg (194 lb 9.6 oz)    History of present illness:  68 y.o. male with a history of PE/DVTof RLE in the past and had been on Coumadin until he sustained a closed head injury and concussion 10 weeks ago and was discontinued form Coumadin Rx who presents to the ED with complaints of LLE pain and swelling And pain with increased warmth from the area x 2 days. He reports that on Sunday 3 days PTA he was walking up a hill and heard a "pop" in his Left leg. He noticed pain and swelling the next day. He denies having any Chest pain or SOB. He was evaluated in the ED and a Venous duplex US was performed revealing DVT in the LLE. The EDP discussed his case with the Neurosurgery and he was started on an IV heparin drip and referred for admission.    Hospital Course:  1. LLE DVT (deep venous thrombosis) IV Heparin given during first 24 hours; well tolerated and no signs of  bleeding  Transition to lovenox and coumadin Hypercoagulable Panel Pending at discharge; but patient will need long term anticoagulation                          Follow up with PCP for INR check up and coumadin dose adjustments    2. Closed head injury with concussion- Recent hx 10 weeks ago, was taken off Coumadin at that time. ED discussed with neurosurgery doctor on call (Dr. Ronnald Ramp) and given presentation of DVT and resolution of hemorrhagic contusion on follow CT head during 10/15; ok by neurosurgery to resume anticoagulation.    3. Diabetes mellitus type 2 in Non-Obese HbA1C 5.6  continue metformin and low carb diet. Excellent control and no hypoglycemic events.   4. Hypertension  stable. Will continue current antihypertensive agents. Patient advise to follow low sodium diet    5. Hyperlipidemia Continue Atorvastatin     6. PROSTATE CANCER: continue outpatient follow up /surveillance by urology   Procedures:  LE duplex: positive LLE DVT  Consultations:  None   Discharge Exam: Filed Vitals:   11/16/14 0509  BP: 110/61  Pulse: 77  Temp: 98.3 F (36.8 C)  Resp: 20    General: NAD, afebrile, no CP or SOB. Patient reports LLE pain  Cardiovascular: regular rate, no rubs or gallops  Respiratory: CTA bilaterally  Abdomen: soft, NT, ND, positive BS  Musculoskeletal: LLE swelling and pain with palpation   Discharge Instructions You were cared for by a hospitalist during  your hospital stay. If you have any questions about your discharge medications or the care you received while you were in the hospital after you are discharged, you can call the unit and asked to speak with the hospitalist on call if the hospitalist that took care of you is not available. Once you  are discharged, your primary care physician will handle any further medical issues. Please note that NO REFILLS for any discharge medications will be authorized once you are discharged, as it is imperative that you return to your primary care physician (or establish a relationship with a primary care physician if you do not have one) for your aftercare needs so that they can reassess your need for medications and monitor your lab values.  Discharge Instructions    Diet - low sodium heart healthy    Complete by:  As directed      Discharge instructions    Complete by:  As directed   Follow up with PCP in 2 days (11/19/14) Take medications as prescribed Avoid the use of NSAID's (advil, ASA, aleve, excedrin, motrin, ibuprofen and goody powders) Limit amount of greens (as can affect coumadin level) Maintain good hydration          Current Discharge Medication List    START taking these medications   Details  enoxaparin (LOVENOX) 100 MG/ML injection Inject 0.9 mLs (90 mg total) into the skin every 12 (twelve) hours. Qty: 10 Syringe, Refills: 0    warfarin (COUMADIN) 7.5 MG tablet Take 1 tablet (7.5 mg total) by mouth one time only at 6 PM. Qty: 20 tablet, Refills: 0      CONTINUE these medications which have NOT CHANGED   Details  atorvastatin (LIPITOR) 80 MG tablet Take 80 mg by mouth at bedtime.    lithium carbonate 300 MG capsule Take 300 mg by mouth 2 (two) times daily.     metFORMIN (GLUCOPHAGE) 500 MG tablet Take 500 mg by mouth daily.     traZODone (DESYREL) 100 MG tablet Take 100 mg by mouth at bedtime.    acetaminophen (TYLENOL) 500 MG tablet Take 500-1,000 mg by mouth every 6 (six) hours as needed for mild pain or moderate pain.    oxyCODONE-acetaminophen (PERCOCET/ROXICET) 5-325 MG per tablet Take 1-2 tablets by mouth every 4 (four) hours as needed for moderate pain. Qty: 90 tablet, Refills: 0      STOP taking these medications     amoxicillin (AMOXIL) 500 MG tablet       ibuprofen (ADVIL,MOTRIN) 200 MG tablet      methylPREDNIsolone (MEDROL DOSPACK) 4 MG tablet        Allergies  Allergen Reactions  . Altace [Ramipril] Hives  . Sulfonamide Derivatives Hives and Itching   Follow-up Information    Follow up with Gennette Pac, MD On 11/19/2014.   Specialty:  Family Medicine   Why:  contact office to set up that appointment    Contact information:   [address] [place] [address] [phone]       The results of significant diagnostics from this hospitalization (including imaging, microbiology, ancillary and laboratory) are listed below for reference.     Labs: Basic Metabolic Panel:  Recent Labs Lab 11/14/14 2127 11/15/14 0410 11/16/14 0441  NA 136* 136* 139  K 4.1 3.8 4.2  CL 99 101 104  CO2 25 24 25   GLUCOSE 110* 149* 106*  BUN 12 14 11   CREATININE 1.02 1.09 1.00  CALCIUM 10.0 9.0 9.1  CBC:  Recent Labs Lab 11/14/14 2127 11/15/14 0410 11/16/14 0441  WBC 6.7 6.5 6.2  NEUTROABS 4.7  --   --   HGB 11.7* 10.3* 10.1*  HCT 37.2* 32.5* 31.7*  MCV 94.7 94.8 93.8  PLT 172 162 149*   BNP: BNP (last 3 results)  Recent Labs  08/31/14 1256  PROBNP 340.4*   CBG:  Recent Labs Lab 11/15/14 1208 11/15/14 1706 11/15/14 2137 11/16/14 0744 11/16/14 1136  GLUCAP 107* 108* 136* 169* 107*   Signed:  Barton Dubois  [facility] 11/16/2014, 2:28 PM

## 2014-11-16 NOTE — Progress Notes (Signed)
ANTICOAGULATION CONSULT NOTE - Follow Up Consult  Pharmacy Consult for IV Heparin --> Lovenox / Coumadin bridge Indication: DVT  Allergies  Allergen Reactions  . Altace [Ramipril] Hives  . Sulfonamide Derivatives Hives and Itching    Patient Measurements: Height: 6' (182.9 cm) Weight: 194 lb 9.6 oz (88.27 kg) IBW/kg (Calculated) : 77.6 Heparin Dosing Weight:   Vital Signs: Temp: 98.3 F (36.8 C) (12/18 0509) Temp Source: Oral (12/18 0509) BP: 110/61 mmHg (12/18 0509) Pulse Rate: 77 (12/18 0509)  Labs:  Recent Labs  11/14/14 2127  11/15/14 0410 11/15/14 0725 11/15/14 1408 11/16/14 0441  HGB 11.7*  --  10.3*  --   --  10.1*  HCT 37.2*  --  32.5*  --   --  31.7*  PLT 172  --  162  --   --  149*  APTT 35  --   --   --   --   --   LABPROT 14.1  --   --   --   --  14.1  INR 1.08  --   --   --   --  1.07  HEPARINUNFRC  --   < >  --  0.49 0.43 0.36  CREATININE 1.02  --  1.09  --   --  1.00  < > = values in this interval not displayed.  Estimated Creatinine Clearance: 77.6 mL/min (by C-G formula based on Cr of 1).  Assessment: 39 yoM with extensive DVT in LLE on IV heparin.  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  Hypercoaguable panel in process. Neurosurgery ok with resuming chronic anticoagulation.  Pharmacy consulted to transition patient to lovenox / warfarin bridge.    Previous warfarin dose per medication history records: 5mg  daily except 10mg  on Tuesday and Friday (October 2015)  Today: INR 1.07 CBC: Hgb low @ 10.1, plts WNL.  No s/sxs bleeding per RN.   Renal: SCr 1.0, stable, CrCl ~ 77 Wt = 88.3kg Day 2 of overlap  Goal of Therapy:  INR 2-3 Monitor platelets by anticoagulation protocol: Yes   Plan:   Continue Lovenox 90mg  (1mg /kg) q12h for a minimum 5 day overlap or 2 consecutive days of INR  >2-whichever is longer  Warfarin 7.5mg  po x1 tonight  Daily PT/INR, CBC  Dolly Rias RPh 11/16/2014, 10:26 AM Pager [phone]

## 2014-11-17 LAB — FACTOR 5 LEIDEN

## 2014-11-17 LAB — PROTHROMBIN GENE MUTATION

## 2015-04-12 ENCOUNTER — Other Ambulatory Visit: Payer: Self-pay | Admitting: Family Medicine

## 2015-04-12 DIAGNOSIS — R935 Abnormal findings on diagnostic imaging of other abdominal regions, including retroperitoneum: Secondary | ICD-10-CM

## 2015-04-22 ENCOUNTER — Other Ambulatory Visit (HOSPITAL_COMMUNITY): Payer: Self-pay | Admitting: Orthopedic Surgery

## 2015-04-22 DIAGNOSIS — M25551 Pain in right hip: Secondary | ICD-10-CM

## 2015-04-23 ENCOUNTER — Other Ambulatory Visit: Payer: Self-pay | Admitting: Family Medicine

## 2015-04-23 ENCOUNTER — Ambulatory Visit
Admission: RE | Admit: 2015-04-23 | Discharge: 2015-04-23 | Disposition: A | Discharge status: Home / Self Care | Payer: PPO | Source: Ambulatory Visit | Attending: Family Medicine | Admitting: Family Medicine

## 2015-04-23 DIAGNOSIS — R935 Abnormal findings on diagnostic imaging of other abdominal regions, including retroperitoneum: Secondary | ICD-10-CM

## 2015-05-01 ENCOUNTER — Encounter (HOSPITAL_COMMUNITY)
Admission: RE | Admit: 2015-05-01 | Discharge: 2015-05-01 | Disposition: A | Discharge status: Home / Self Care | Payer: PPO | Source: Ambulatory Visit | Attending: Orthopedic Surgery | Admitting: Orthopedic Surgery

## 2015-05-01 DIAGNOSIS — M79651 Pain in right thigh: Secondary | ICD-10-CM | POA: Diagnosis not present

## 2015-05-01 DIAGNOSIS — Z96643 Presence of artificial hip joint, bilateral: Secondary | ICD-10-CM | POA: Diagnosis not present

## 2015-05-01 DIAGNOSIS — M25551 Pain in right hip: Secondary | ICD-10-CM | POA: Diagnosis present

## 2015-05-01 MED ORDER — TECHNETIUM TC 99M MEDRONATE IV KIT
26.9000 | PACK | Freq: Once | INTRAVENOUS | Status: AC | PRN
  Administered 2015-05-01: 26.9 via INTRAVENOUS

## 2016-04-15 ENCOUNTER — Other Ambulatory Visit: Payer: Self-pay | Admitting: Family Medicine

## 2016-04-15 DIAGNOSIS — I6389 Other cerebral infarction: Secondary | ICD-10-CM

## 2016-04-15 DIAGNOSIS — I714 Abdominal aortic aneurysm, without rupture, unspecified: Secondary | ICD-10-CM

## 2016-04-23 ENCOUNTER — Other Ambulatory Visit: Payer: PPO

## 2016-04-29 ENCOUNTER — Ambulatory Visit
Admission: RE | Admit: 2016-04-29 | Discharge: 2016-04-29 | Disposition: A | Discharge status: Home / Self Care | Payer: Medicare Other | Source: Ambulatory Visit | Attending: Family Medicine | Admitting: Family Medicine

## 2016-04-29 DIAGNOSIS — I6389 Other cerebral infarction: Secondary | ICD-10-CM

## 2016-04-29 DIAGNOSIS — I714 Abdominal aortic aneurysm, without rupture, unspecified: Secondary | ICD-10-CM

## 2016-10-01 IMAGING — CT CT CHEST NODULE FOLLOW UP LOW DOSE W/O CM
1 of 4 series · 15 of 36 positions shown, 19 images · non-contrast
Comparison: 09/03/2010.

CLINICAL DATA: Ex-smoker, 40 pack-year history, quit 7 months ago.

EXAM:
CT CHEST WITHOUT CONTRAST
TECHNIQUE: Multidetector CT imaging of the chest was performed following the
standard protocol without IV contrast..

[Series 3: lung windows · axial · 0.78mm/px · z∈[-246,+25]mm · 15 of 241 slices shown, 19 images]
[im 12/241  mediastinal]
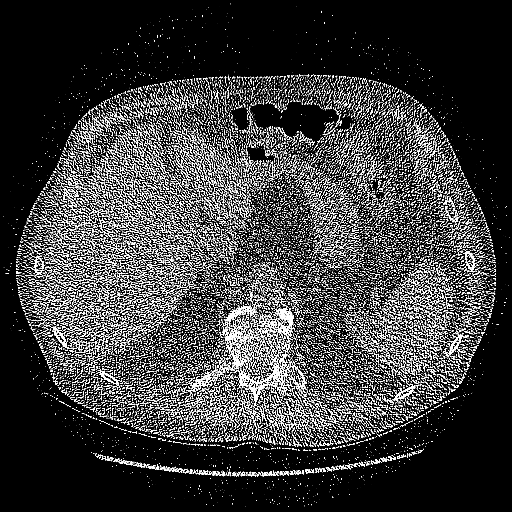
[im 12/241  lung]
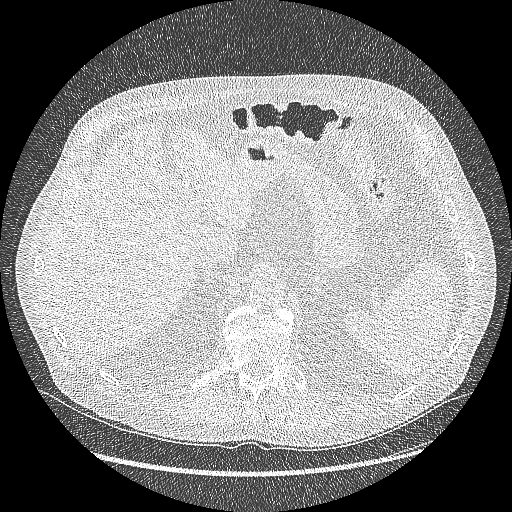
[im 35/241  lung]
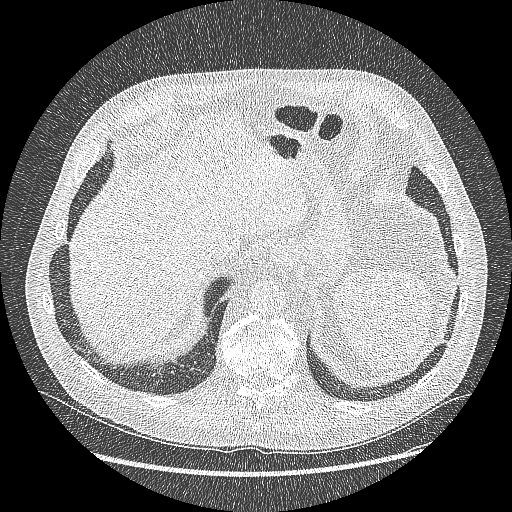
[im 46/241  lung]
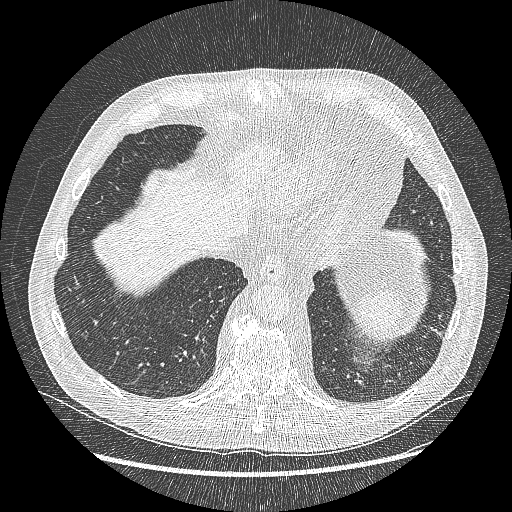
[im 58/241  lung]
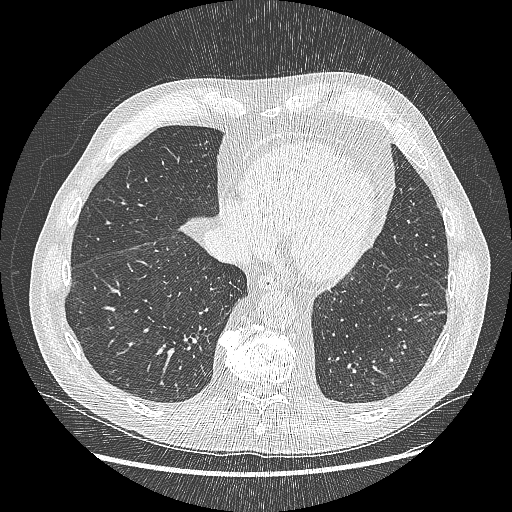
[im 81/241  mediastinal]
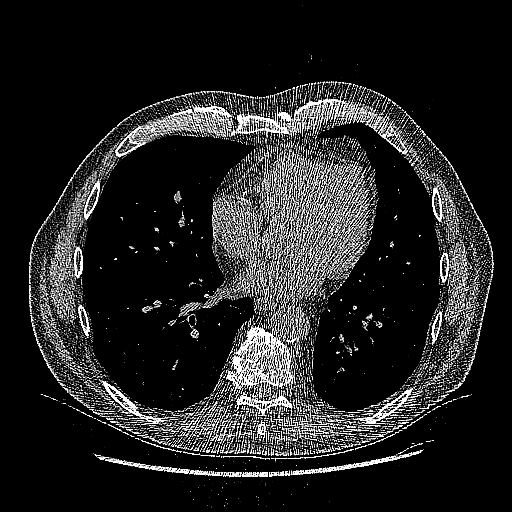
[im 81/241  lung]
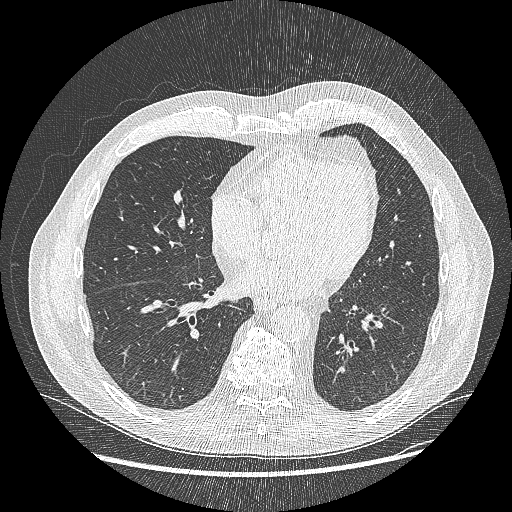
[im 92/241  lung]
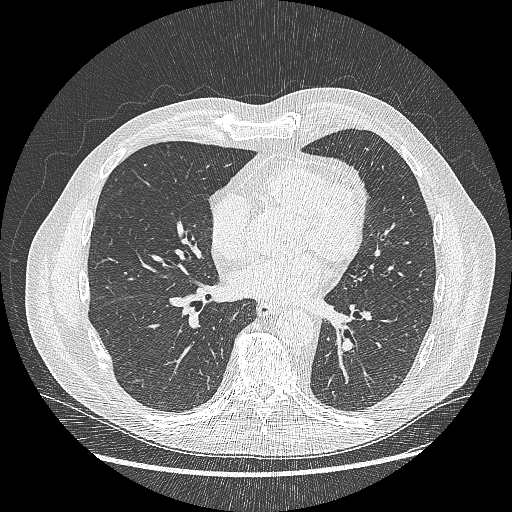
[im 103/241  lung]
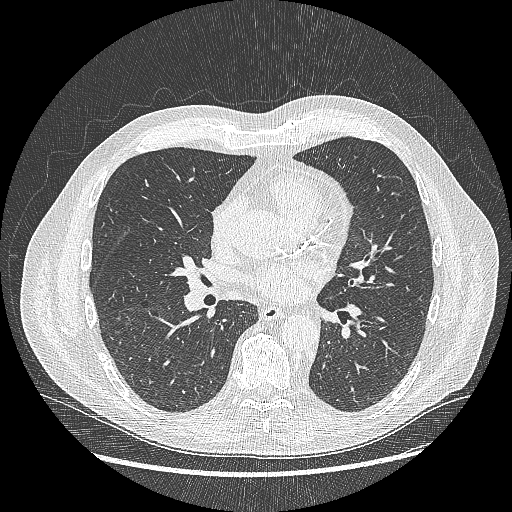
[im 126/241  lung]
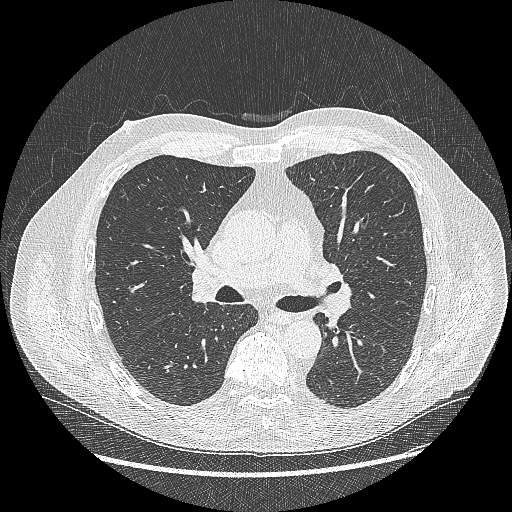
[im 138/241  mediastinal]
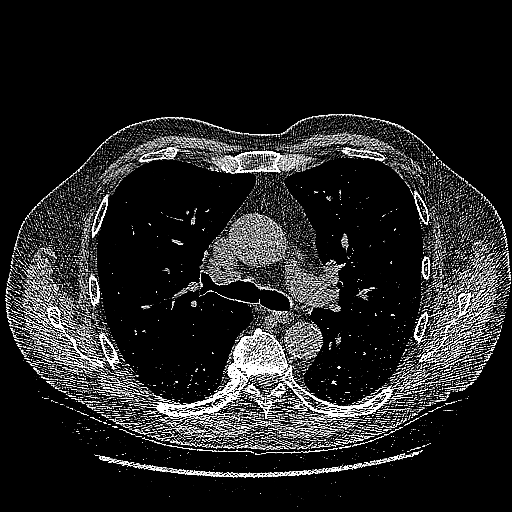
[im 138/241  lung]
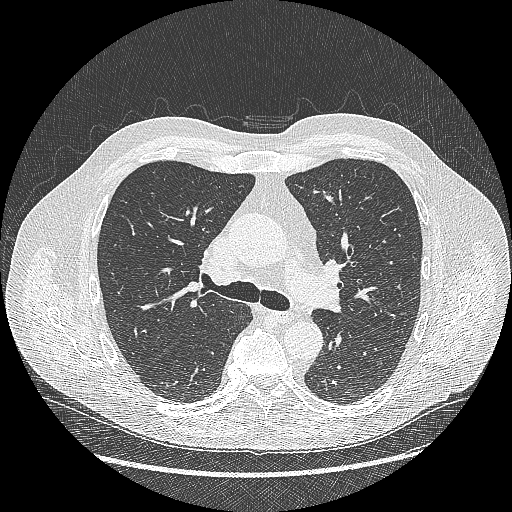
[im 149/241  lung]
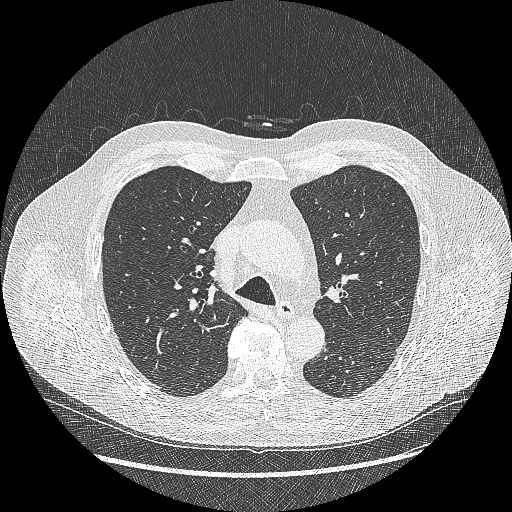
[im 172/241  lung]
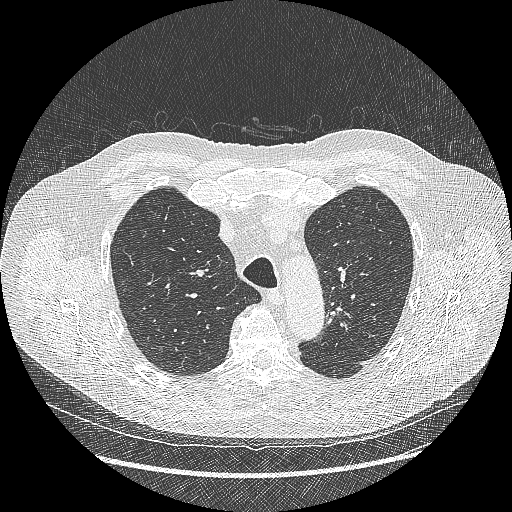
[im 183/241  lung]
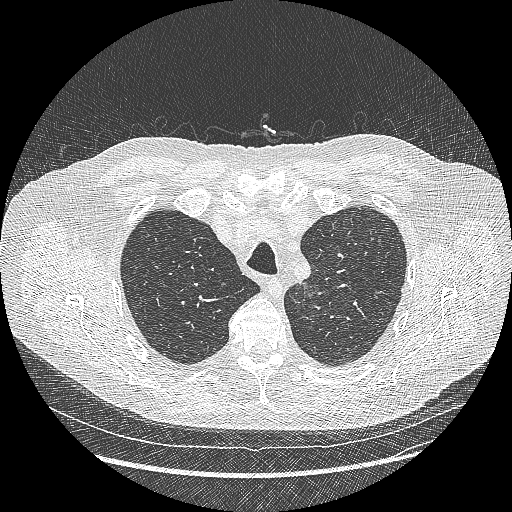
[im 195/241  mediastinal]
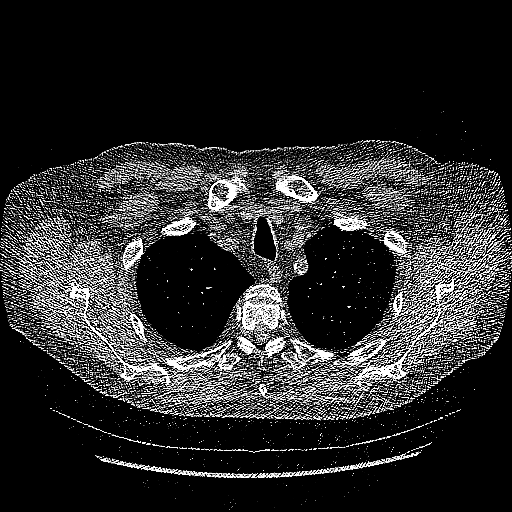
[im 195/241  lung]
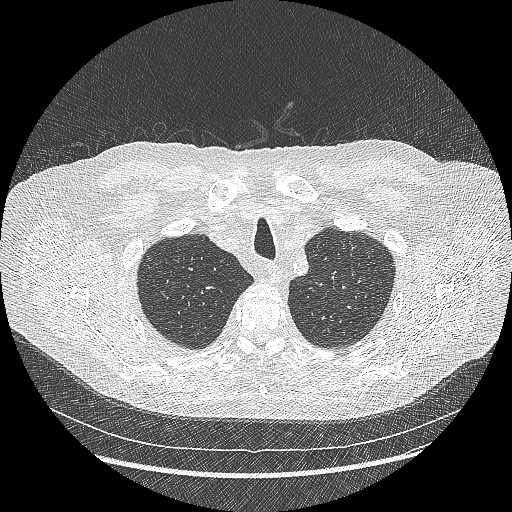
[im 218/241  lung]
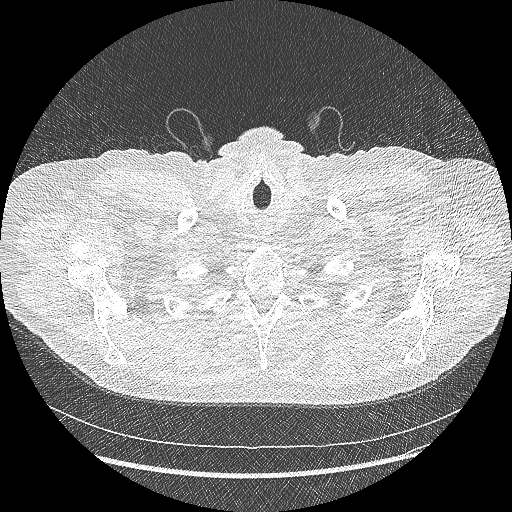
[im 229/241  lung]
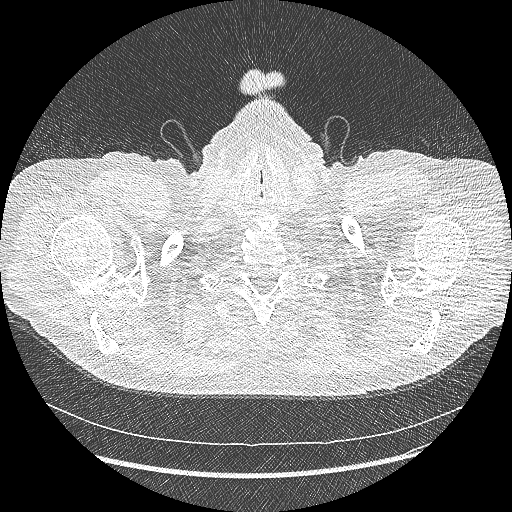

[15 of 36 positions shown; findings below may reference images not displayed]

FINDINGS: No pathologically enlarged mediastinal or axillary lymph nodes.
Hilar regions are difficult to definitively evaluate without IV
contrast but appear grossly unremarkable. Three-vessel coronary
artery calcification. Heart size normal. No pericardial effusion.

Minimal biapical pleural thickening, right greater than left. No
discrete pulmonary nodule. Scattered subsegmental atelectasis or
scarring in the lower lobes. No pleural fluid. Airway is
unremarkable.

Incidental imaging of the upper abdomen shows a visualized portions
of the liver, adrenal glands, spleen and stomach to be grossly
unremarkable. No upper abdominal adenopathy.

No worrisome lytic or sclerotic lesions. Old posterior lateral right
rib fracture. Degenerative changes are seen in the spine.
IMPRESSION: 1. Lung-RADS Category 1, negative. Continue annual screening with
low-dose chest CT without contrast in 12 months.
2. Coronary artery calcification.

## 2017-01-08 ENCOUNTER — Ambulatory Visit: Payer: Self-pay | Admitting: Orthopedic Surgery

## 2017-01-19 ENCOUNTER — Encounter (HOSPITAL_COMMUNITY): Payer: Self-pay

## 2017-01-19 ENCOUNTER — Other Ambulatory Visit (HOSPITAL_COMMUNITY): Payer: Self-pay | Admitting: Emergency Medicine

## 2017-01-19 NOTE — Patient Instructions (Addendum)
[name] Hall  01/19/2017   Your procedure is scheduled on: 01-27-17  Report to [facility] Main  Entrance take [facility]  elevators to 3rd floor to  [place] at [phone].  Call this number if you have problems the morning of surgery [phone]   Remember: ONLY 1 PERSON MAY GO WITH YOU TO SHORT STAY TO GET  READY MORNING OF [place].  Do not eat food or drink liquids :After Midnight.     Take these medicines the morning of surgery with A SIP OF WATER: Tylenol as needed, Lithium  DO NOT TAKE ANY DIABETIC MEDICATIONS DAY OF YOUR SURGERY                               You may not have any metal on your body including hair pins and              piercings  Do not wear jewelry, make-up, lotions, powders or perfumes, deodorant             Do not wear nail polish.  Do not shave  48 hours prior to surgery.              Men may shave face and neck.   Do not bring valuables to the hospital. [place].  Contacts, dentures or bridgework may not be worn into surgery.  Leave suitcase in the car. After surgery it may be brought to your room.              Please read over the following fact sheets you were given: _____________________________________________________________________             How to Manage Your Diabetes Before and After Surgery  Why is it important to control my blood sugar before and after surgery? . Improving blood sugar levels before and after surgery helps healing and can limit problems. . A way of improving blood sugar control is eating a healthy diet by: o  Eating less sugar and carbohydrates o  Increasing activity/exercise o  Talking with your doctor about reaching your blood sugar goals . High blood sugars (greater than 180 mg/dL) can raise your risk of infections and slow your recovery, so you will need to focus on controlling your diabetes during the weeks before surgery. . Make sure  that the doctor who takes care of your diabetes knows about your planned surgery including the date and location.  How do I manage my blood sugar before surgery? . Check your blood sugar at least 4 times a day, starting 2 days before surgery, to make sure that the level is not too high or low. o Check your blood sugar the morning of your surgery when you wake up and every 2 hours until you get to the Short Stay unit. . If your blood sugar is less than 70 mg/dL, you will need to treat for low blood sugar: o Do not take insulin. o Treat a low blood sugar (less than 70 mg/dL) with  cup of clear juice (cranberry or apple), 4 glucose tablets, OR glucose gel. o Recheck blood sugar in 15 minutes after treatment (to make sure it is greater than 70 mg/dL). If your blood sugar is not greater  than 70 mg/dL on recheck, call [phone] for further instructions. . Report your blood sugar to the short stay nurse when you get to Short Stay.  . If you are admitted to the hospital after surgery: o Your blood sugar will be checked by the staff and you will probably be given insulin after surgery (instead of oral diabetes medicines) to make sure you have good blood sugar levels. o The goal for blood sugar control after surgery is 80-180 mg/dL.   WHAT DO I DO ABOUT MY DIABETES MEDICATION?  Marland Kitchen Do not take oral diabetes medicines (pills) the morning of surgery.  . THE DAY BEFORE SURGERY, take your METFORMIN as usual     . THE MORNING OF SURGERY, DO NOT TAKE YOUR METFORMIN!!    Reviewed and Endorsed by [facility] Patient Education Committee, August 2015  [facility] - Preparing for Surgery Before surgery, you can play an important role.  Because skin is not sterile, your skin needs to be as free of germs as possible.  You can reduce the number of germs on your skin by washing with CHG (chlorahexidine gluconate) soap before surgery.  CHG is an antiseptic cleaner which kills germs and bonds with the skin to  continue killing germs even after washing. Please DO NOT use if you have an allergy to CHG or antibacterial soaps.  If your skin becomes reddened/irritated stop using the CHG and inform your nurse when you arrive at Short Stay. Do not shave (including legs and underarms) for at least 48 hours prior to the first CHG shower.  You may shave your face/neck. Please follow these instructions carefully:  1.  Shower with CHG Soap the night before surgery and the  morning of Surgery.  2.  If you choose to wash your hair, wash your hair first as usual with your  normal  shampoo.  3.  After you shampoo, rinse your hair and body thoroughly to remove the  shampoo.                           4.  Use CHG as you would any other liquid soap.  You can apply chg directly  to the skin and wash                       Gently with a scrungie or clean washcloth.  5.  Apply the CHG Soap to your body ONLY FROM THE NECK DOWN.   Do not use on face/ open                           Wound or open sores. Avoid contact with eyes, ears mouth and genitals (private parts).                       Wash face,  Genitals (private parts) with your normal soap.             6.  Wash thoroughly, paying special attention to the area where your surgery  will be performed.  7.  Thoroughly rinse your body with warm water from the neck down.  8.  DO NOT shower/wash with your normal soap after using and rinsing off  the CHG Soap.                9.  Pat yourself dry with a clean towel.  10.  Wear clean pajamas.            11.  Place clean sheets on your bed the night of your first shower and do not  sleep with pets. Day of Surgery : Do not apply any lotions/deodorants the morning of surgery.  Please wear clean clothes to the hospital/surgery center.  FAILURE TO FOLLOW THESE INSTRUCTIONS MAY RESULT IN THE CANCELLATION OF YOUR SURGERY PATIENT SIGNATURE_________________________________  NURSE  SIGNATURE__________________________________  ________________________________________________________________________   [name]  An incentive spirometer is a tool that can help keep your lungs clear and active. This tool measures how well you are filling your lungs with each breath. Taking long deep breaths may help reverse or decrease the chance of developing breathing (pulmonary) problems (especially infection) following:  A long period of time when you are unable to move or be active. BEFORE THE PROCEDURE   If the spirometer includes an indicator to show your best effort, your nurse or respiratory therapist will set it to a desired goal.  If possible, sit up straight or lean slightly forward. Try not to slouch.  Hold the incentive spirometer in an upright position. INSTRUCTIONS FOR USE  1. Sit on the edge of your bed if possible, or sit up as far as you can in bed or on a chair. 2. Hold the incentive spirometer in an upright position. 3. Breathe out normally. 4. Place the mouthpiece in your mouth and seal your lips tightly around it. 5. Breathe in slowly and as deeply as possible, raising the piston or the ball toward the top of the column. 6. Hold your breath for 3-5 seconds or for as long as possible. Allow the piston or ball to fall to the bottom of the column. 7. Remove the mouthpiece from your mouth and breathe out normally. 8. Rest for a few seconds and repeat Steps 1 through 7 at least 10 times every 1-2 hours when you are awake. Take your time and take a few normal breaths between deep breaths. 9. The spirometer may include an indicator to show your best effort. Use the indicator as a goal to work toward during each repetition. 10. After each set of 10 deep breaths, practice coughing to be sure your lungs are clear. If you have an incision (the cut made at the time of surgery), support your incision when coughing by placing a pillow or rolled up towels firmly  against it. Once you are able to get out of bed, walk around indoors and cough well. You may stop using the incentive spirometer when instructed by your caregiver.  RISKS AND COMPLICATIONS  Take your time so you do not get dizzy or light-headed.  If you are in pain, you may need to take or ask for pain medication before doing incentive spirometry. It is harder to take a deep breath if you are having pain. AFTER USE  Rest and breathe slowly and easily.  It can be helpful to keep track of a log of your progress. Your caregiver can provide you with a simple table to help with this. If you are using the spirometer at home, follow these instructions: [place] IF:   You are having difficultly using the spirometer.  You have trouble using the spirometer as often as instructed.  Your pain medication is not giving enough relief while using the spirometer.  You develop fever of 100.5 F (38.1 C) or higher. SEEK IMMEDIATE MEDICAL CARE IF:   You cough up bloody  sputum that had not been present before.  You develop fever of 102 F (38.9 C) or greater.  You develop worsening pain at or near the incision site. MAKE SURE YOU:   Understand these instructions.  Will watch your condition.  Will get help right away if you are not doing well or get worse. Document Released: 03/29/2007 Document Revised: 02/08/2012 Document Reviewed: 05/30/2007 ExitCare Patient Information 2014 Exit[employer], [place].   ________________________________________________________________________  WHAT IS A BLOOD TRANSFUSION? Blood Transfusion Information  A transfusion is the replacement of blood or some of its parts. Blood is made up of multiple cells which provide different functions.  Red blood cells carry oxygen and are used for blood loss replacement.  White blood cells fight against infection.  Platelets control bleeding.  Plasma helps clot blood.  Other blood products are available for  specialized needs, such as hemophilia or other clotting disorders. BEFORE THE TRANSFUSION  Who gives blood for transfusions?   Healthy volunteers who are fully evaluated to make sure their blood is safe. This is blood bank blood. Transfusion therapy is the safest it has ever been in the practice of medicine. Before blood is taken from a donor, a complete history is taken to make sure that person has no history of diseases nor engages in risky social behavior (examples are intravenous drug use or sexual activity with multiple partners). The donor's travel history is screened to minimize risk of transmitting infections, such as malaria. The donated blood is tested for signs of infectious diseases, such as HIV and hepatitis. The blood is then tested to be sure it is compatible with you in order to minimize the chance of a transfusion reaction. If you or a relative donates blood, this is often done in anticipation of surgery and is not appropriate for emergency situations. It takes many days to process the donated blood. RISKS AND COMPLICATIONS Although transfusion therapy is very safe and saves many lives, the main dangers of transfusion include:   Getting an infectious disease.  Developing a transfusion reaction. This is an allergic reaction to something in the blood you were given. Every precaution is taken to prevent this. The decision to have a blood transfusion has been considered carefully by your caregiver before blood is given. Blood is not given unless the benefits outweigh the risks. AFTER THE TRANSFUSION  Right after receiving a blood transfusion, you will usually feel much better and more energetic. This is especially true if your red blood cells have gotten low (anemic). The transfusion raises the level of the red blood cells which carry oxygen, and this usually causes an energy increase.  The nurse administering the transfusion will monitor you carefully for complications. HOME CARE  INSTRUCTIONS  No special instructions are needed after a transfusion. You may find your energy is better. Speak with your caregiver about any limitations on activity for underlying diseases you may have. SEEK MEDICAL CARE IF:   Your condition is not improving after your transfusion.  You develop redness or irritation at the intravenous (IV) site. SEEK IMMEDIATE MEDICAL CARE IF:  Any of the following symptoms occur over the next 12 hours:  Shaking chills.  You have a temperature by mouth above 102 F (38.9 C), not controlled by medicine.  Chest, back, or muscle pain.  People around you feel you are not acting correctly or are confused.  Shortness of breath or difficulty breathing.  Dizziness and fainting.  You get a rash or develop hives.  You have a  decrease in urine output.  Your urine turns a dark color or changes to pink, red, or brown. Any of the following symptoms occur over the next 10 days:  You have a temperature by mouth above 102 F (38.9 C), not controlled by medicine.  Shortness of breath.  Weakness after normal activity.  The white part of the eye turns yellow (jaundice).  You have a decrease in the amount of urine or are urinating less often.  Your urine turns a dark color or changes to pink, red, or brown. Document Released: 11/13/2000 Document Revised: 02/08/2012 Document Reviewed: 07/02/2008 [facility] Patient Information 2014 [place], [place].  _______________________________________________________________________

## 2017-01-19 NOTE — Progress Notes (Signed)
Clearance Dr Rex Kras 10-14-16 on chart LOV Dr Rex Kras 10-14-16 on chart EKG 10-14-16 on chart

## 2017-01-20 ENCOUNTER — Encounter (INDEPENDENT_AMBULATORY_CARE_PROVIDER_SITE_OTHER): Payer: Self-pay

## 2017-01-20 ENCOUNTER — Encounter (HOSPITAL_COMMUNITY): Payer: Self-pay

## 2017-01-20 ENCOUNTER — Encounter (HOSPITAL_COMMUNITY)
Admission: RE | Admit: 2017-01-20 | Discharge: 2017-01-20 | Disposition: A | Discharge status: Home / Self Care | Payer: Non-veteran care | Source: Ambulatory Visit | Attending: Orthopedic Surgery | Admitting: Orthopedic Surgery

## 2017-01-20 DIAGNOSIS — T84030A Mechanical loosening of internal right hip prosthetic joint, initial encounter: Secondary | ICD-10-CM | POA: Diagnosis not present

## 2017-01-20 DIAGNOSIS — Z0183 Encounter for blood typing: Secondary | ICD-10-CM | POA: Diagnosis not present

## 2017-01-20 DIAGNOSIS — Z01812 Encounter for preprocedural laboratory examination: Secondary | ICD-10-CM | POA: Diagnosis not present

## 2017-01-20 DIAGNOSIS — Y838 Other surgical procedures as the cause of abnormal reaction of the patient, or of later complication, without mention of misadventure at the time of the procedure: Secondary | ICD-10-CM | POA: Diagnosis not present

## 2017-01-20 HISTORY — DX: Cerebral infarction, unspecified: I63.9

## 2017-01-20 LAB — COMPREHENSIVE METABOLIC PANEL
ALBUMIN: 4.1 g/dL (ref 3.5–5.0)
ALK PHOS: 95 U/L (ref 38–126)
ALT: 19 U/L (ref 17–63)
AST: 18 U/L (ref 15–41)
Anion gap: 4 — ABNORMAL LOW (ref 5–15)
BILIRUBIN TOTAL: 1 mg/dL (ref 0.3–1.2)
BUN: 16 mg/dL (ref 6–20)
CO2: 25 mmol/L (ref 22–32)
CREATININE: 1.1 mg/dL (ref 0.61–1.24)
Calcium: 9.5 mg/dL (ref 8.9–10.3)
Chloride: 111 mmol/L (ref 101–111)
GFR calc Af Amer: >60 mL/min (ref >60)
GFR calc non Af Amer: >60 mL/min (ref >60)
GLUCOSE: 112 mg/dL — AB (ref 65–99)
POTASSIUM: 5.1 mmol/L (ref 3.5–5.1)
Sodium: 140 mmol/L (ref 135–145)
TOTAL PROTEIN: 7.2 g/dL (ref 6.5–8.1)

## 2017-01-20 LAB — CBC
HEMATOCRIT: 42.6 % (ref 39.0–52.0)
HEMOGLOBIN: 13.9 g/dL (ref 13.0–17.0)
MCH: 29.7 pg (ref 26.0–34.0)
MCHC: 32.6 g/dL (ref 30.0–36.0)
MCV: 91 fL (ref 78.0–100.0)
Platelets: 134 10*3/uL — ABNORMAL LOW (ref 150–400)
RBC: 4.68 MIL/uL (ref 4.22–5.81)
RDW: 14 % (ref 11.5–15.5)
WBC: 5.7 10*3/uL (ref 4.0–10.5)

## 2017-01-20 LAB — APTT: APTT: 39 s — AB (ref 24–36)

## 2017-01-20 LAB — SURGICAL PCR SCREEN
MRSA, PCR: NEGATIVE
STAPHYLOCOCCUS AUREUS: POSITIVE — AB

## 2017-01-20 LAB — PROTIME-INR
INR: 2.13
Prothrombin Time: 24.1 seconds — ABNORMAL HIGH (ref 11.4–15.2)

## 2017-01-20 LAB — ABO/RH: ABO/RH(D): O POS

## 2017-01-20 LAB — GLUCOSE, CAPILLARY: GLUCOSE-CAPILLARY: 123 mg/dL — AB (ref 65–99)

## 2017-01-20 NOTE — Progress Notes (Signed)
Pt/inr and ptt routed via epic to aluisio

## 2017-01-21 LAB — HEMOGLOBIN A1C
Hgb A1c MFr Bld: 5.6 % (ref 4.8–5.6)
Mean Plasma Glucose: 114

## 2017-01-26 ENCOUNTER — Ambulatory Visit: Payer: Self-pay | Admitting: Orthopedic Surgery

## 2017-01-26 NOTE — H&P (Signed)
[name] Hall DOB: 06/01/1946 Married / Language: [name] Hall / Race: White Male Date of Admission:  01/27/2017 CC:  Loose Right Total Hip History of Present Illness The patient is a 71 year old male who comes in for a preoperative History and Physical. The patient is scheduled for a right total hip arthroplasty (revision) to be performed by Dr. Dione Plover. Aluisio, MD at [facility] on 01-27-2017. The patient is a 71 year old male who presents today for follow up of their hip. The patient is being followed for their right hip pain. They are nearly 11 years out from right total hip replacement. The patient was last seen by Dr. Wynelle Link in 2013. Symptoms reported include: pain. Current treatment includes: activity modification and pain medications. The following medication has been used for pain control: Tylenol. [name] Hall comes in today for evaluation of the right hip. He has a history of having a left total hip arthroplasty done back in 2002 by Dr. Amada Jupiter. His right hip was replaced in [place], [place] back in 2006. He has been since set up and followed by Dr. Wynelle Link. His last evaluation was back in 2013. He was seen by one of our PAs, Amber, in August and felt he had a component of bursitis/tendinitis. It was mentioned at that visit in 2013 that the stem appeared to be in a little varus and some hypertrophy of the lateral cortex, though it was possible that he had a little stress reaction. He has done well until then up until a few weeks ago. Actually, through history, he says that he noticed it a couple of months ago, but for the past couple of weeks he has been having some lateral hip pain. There is no pain at rest except when he goes to stand up. If he has been sitting for any long period of time, he will have pain on that lateral hip and when he stands up, it will hurt, and it will cause some discomfort the first few steps, but this does ease off after he has been walking. Going up  and down steps, he will go up with the left leg, which is his good leg, but he will go down with the bad trying to avoid discomfort on the right side. There is no numbness or tingling going down the leg. He has not had any recent trauma, injuries, or fall. It will vary from day to day and is not everyday. He says it is not bad today since he has come in. There is no groin pain. There is no back pain. He states it is quite painful if he rolls over onto that side and lays on it. Plain films are reviewed, AP pelvis and lateral of the hip. The stem is in varus, but it has been varus. It looks like it has progressed further. 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 We reviewed his MRI, which was a MARS MRI. 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 He as a metal on metal hip done 11 years ago. I believe that at this point he is going to require revision. He is having pain consistent with loosening of the femoral stem. We did discuss the revision surgery in detail. Ideally, we can just change a liner enough in the acetabulum and not have to  redo the entire acetabulum. He is admitted for revision procedure. They have been treated conservatively in the past for the above stated problem and despite conservative measures, they continue to have progressive pain and severe functional limitations and dysfunction. They have failed non-operative management including home exercise, medications, and diagnostic testing. It is felt that they would benefit from undergoing revison of the total hip replacement. Risks and benefits of the procedure have been discussed with the patient and they elect to proceed with surgery. There are no active contraindications to surgery such as ongoing infection or rapidly progressive neurological  disease.   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 PERCOCET [12/09/2005]: OxyCODONE HCl *ANALGESICS - OPIOID*  Itching. Patient IS ABLE to take Hydrocodone Morphine Sulfate *ANALGESICS - OPIOID*   Family History Cancer  sister Hypertension  mother and father Osteoarthritis  mother and father Osteoporosis  mother  Social History Alcohol use  former drinker Children  3 Current work status  working full time Drug/Alcohol Rehab (Currently)  yes Drug/Alcohol Rehab (Previously)  yes Exercise  Exercises weekly; does running / walking and gym / weights Illicit drug use  no Living situation  live with spouse Marital status  married Number of flights of stairs before winded  4-5 Pain Contract  no Tobacco / smoke exposure  yes Tobacco use  current some days smoker; smoke(d) 1 pack(s) per day  Medication History Tylenol Extra Strength (500MG  Tablet, Oral as needed) Active. Lithium Carbonate (300MG  Capsule, Oral) Active. Lipitor (80MG  Tablet, Oral) Active. TraZODone HCl (100MG  Tablet, Oral) Active. MetFORMIN HCl (500MG  Tablet, Oral) Active. Warfarin Sodium (5MG  Tablet, Oral) Active.  Past Surgical History Tonsillectomy  Total Hip Replacement  bilateral    Review of Systems General Present- Chills and  Fatigue. Not Present- Fever, Memory Loss, Night Sweats, Weight Gain and Weight Loss. Skin Not Present- Eczema, Hives, Itching, Lesions and Rash. HEENT Present- Blurred Vision. Not Present- Dentures, Double Vision, Headache, Hearing Loss, Tinnitus and Visual Loss. Respiratory Not Present- Allergies, Chronic Cough, Coughing up blood, Shortness of breath at rest and Shortness of breath with exertion. Cardiovascular Not Present- Chest Pain, Difficulty Breathing Lying Down, Murmur, Palpitations, Racing/skipping heartbeats and Swelling. Gastrointestinal Not Present- Abdominal Pain, Bloody Stool, Constipation, Diarrhea, Difficulty Swallowing, Heartburn, Jaundice, Loss of appetitie, Nausea and Vomiting. Male Genitourinary Present- Urinary frequency and Urinating at Night. Not Present- Blood in Urine, Discharge, Flank Pain, Incontinence, Painful Urination, Urgency, Urinary Retention and Weak urinary stream. Musculoskeletal Present- Back Pain, Joint Pain, Joint Swelling, Morning Stiffness and Muscle Weakness. Not Present- Muscle Pain and Spasms. Neurological Not Present- Blackout spells, Difficulty with balance, Dizziness, Paralysis, Tremor and Weakness. Psychiatric Present- Memory Loss. Not Present- Insomnia.  Vitals  Weight: 205 lb Height: 72in Weight was reported by patient. Height was reported by patient. Body Surface Area: 2.15 m Body Mass Index: 27.8 kg/m  Pulse: 68 (Regular)  BP: 110/60 (Sitting, Right Arm, Standard)   Physical Exam General Mental  Status -Alert, cooperative and good historian. General Appearance-pleasant, Not in acute distress. Orientation-Oriented X3. Build & Nutrition-Well nourished and Well developed.  Head and Neck Head-normocephalic, atraumatic . Neck Global Assessment - supple, no bruit auscultated on the right, no bruit auscultated on the left.  Eye Pupil - Bilateral-Regular and Round. Motion - Bilateral-EOMI.  Chest and Lung  Exam Auscultation Breath sounds - clear at anterior chest wall and clear at posterior chest wall. Adventitious sounds - No Adventitious sounds.  Cardiovascular Auscultation Rhythm - Regular rate and rhythm. Heart Sounds - S1 WNL and S2 WNL. Murmurs & Other Heart Sounds - Auscultation of the heart reveals - No Murmurs.  Abdomen Palpation/Percussion Tenderness - Abdomen is non-tender to palpation. Rigidity (guarding) - Abdomen is soft. Auscultation Auscultation of the abdomen reveals - Bowel sounds normal.  Male Genitourinary Note: Not done, not pertinent to present illness   Musculoskeletal Note: He is alert and oriented, in no apparent distress. His right hip can be flexed to about 110, rotated in 20, out 30, abduct 30 with discomfort on range of motion.  RADIOGRAPHS Plain films are reviewed, AP pelvis and lateral of the hip. The stem is in varus, but it has been varus. It looks like it has progressed further. 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 We reviewed his MRI, which was a MARS MRI. 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  Revision Right Total Hip Replacement  Disposition: Home with family. He has a walker, bedside commode, and shower seat  PCP: Dr. Hulan Fess - Patient has been seen preoperatively and felt to be stable for surgery. "Will need bridging Lovenox before surgery."  Topical TXA  Anesthesia Issues: None  Patient was instructed on what medications to stop prior to surgery. He was given Lovenox RX and directions on how to use prior to surgery.  Signed electronically by Ok Edwards, III PA-C

## 2017-01-26 NOTE — H&P (Deleted)
  The note originally documented on this encounter has been moved the the encounter in which it belongs.  

## 2017-01-27 ENCOUNTER — Encounter (HOSPITAL_COMMUNITY): Payer: Self-pay

## 2017-01-27 ENCOUNTER — Inpatient Hospital Stay (HOSPITAL_COMMUNITY): Payer: Non-veteran care | Admitting: Certified Registered Nurse Anesthetist

## 2017-01-27 ENCOUNTER — Inpatient Hospital Stay (HOSPITAL_COMMUNITY)
Admission: RE | Admit: 2017-01-27 | Discharge: 2017-01-29 | DRG: 467 | Disposition: A | Discharge status: Home Health | Payer: Non-veteran care | Source: Ambulatory Visit | Attending: Orthopedic Surgery | Admitting: Orthopedic Surgery

## 2017-01-27 ENCOUNTER — Inpatient Hospital Stay (HOSPITAL_COMMUNITY): Payer: Non-veteran care

## 2017-01-27 ENCOUNTER — Encounter (HOSPITAL_COMMUNITY)
Admission: RE | Disposition: A | Discharge status: Home Health | Payer: Self-pay | Source: Ambulatory Visit | Attending: Orthopedic Surgery

## 2017-01-27 DIAGNOSIS — Y792 Prosthetic and other implants, materials and accessory orthopedic devices associated with adverse incidents: Secondary | ICD-10-CM | POA: Diagnosis present

## 2017-01-27 DIAGNOSIS — Z96649 Presence of unspecified artificial hip joint: Secondary | ICD-10-CM

## 2017-01-27 DIAGNOSIS — Z8261 Family history of arthritis: Secondary | ICD-10-CM

## 2017-01-27 DIAGNOSIS — F17210 Nicotine dependence, cigarettes, uncomplicated: Secondary | ICD-10-CM | POA: Diagnosis present

## 2017-01-27 DIAGNOSIS — E1136 Type 2 diabetes mellitus with diabetic cataract: Secondary | ICD-10-CM | POA: Diagnosis present

## 2017-01-27 DIAGNOSIS — Z86718 Personal history of other venous thrombosis and embolism: Secondary | ICD-10-CM

## 2017-01-27 DIAGNOSIS — Z419 Encounter for procedure for purposes other than remedying health state, unspecified: Secondary | ICD-10-CM

## 2017-01-27 DIAGNOSIS — Z8673 Personal history of transient ischemic attack (TIA), and cerebral infarction without residual deficits: Secondary | ICD-10-CM

## 2017-01-27 DIAGNOSIS — Z96642 Presence of left artificial hip joint: Secondary | ICD-10-CM | POA: Diagnosis present

## 2017-01-27 DIAGNOSIS — Z885 Allergy status to narcotic agent status: Secondary | ICD-10-CM | POA: Diagnosis not present

## 2017-01-27 DIAGNOSIS — Z923 Personal history of irradiation: Secondary | ICD-10-CM | POA: Diagnosis not present

## 2017-01-27 DIAGNOSIS — T84030A Mechanical loosening of internal right hip prosthetic joint, initial encounter: Secondary | ICD-10-CM | POA: Diagnosis present

## 2017-01-27 DIAGNOSIS — D6851 Activated protein C resistance: Secondary | ICD-10-CM | POA: Diagnosis present

## 2017-01-27 DIAGNOSIS — Z7901 Long term (current) use of anticoagulants: Secondary | ICD-10-CM

## 2017-01-27 DIAGNOSIS — I2699 Other pulmonary embolism without acute cor pulmonale: Secondary | ICD-10-CM

## 2017-01-27 DIAGNOSIS — Z8546 Personal history of malignant neoplasm of prostate: Secondary | ICD-10-CM | POA: Diagnosis not present

## 2017-01-27 DIAGNOSIS — Z86711 Personal history of pulmonary embolism: Secondary | ICD-10-CM | POA: Diagnosis not present

## 2017-01-27 DIAGNOSIS — E785 Hyperlipidemia, unspecified: Secondary | ICD-10-CM | POA: Diagnosis present

## 2017-01-27 DIAGNOSIS — Z7984 Long term (current) use of oral hypoglycemic drugs: Secondary | ICD-10-CM

## 2017-01-27 DIAGNOSIS — Z882 Allergy status to sulfonamides status: Secondary | ICD-10-CM

## 2017-01-27 DIAGNOSIS — I10 Essential (primary) hypertension: Secondary | ICD-10-CM | POA: Diagnosis present

## 2017-01-27 DIAGNOSIS — T84018D Broken internal joint prosthesis, other site, subsequent encounter: Secondary | ICD-10-CM

## 2017-01-27 HISTORY — PX: TOTAL HIP REVISION: SHX763

## 2017-01-27 HISTORY — DX: Other pulmonary embolism without acute cor pulmonale: I26.99

## 2017-01-27 LAB — GLUCOSE, CAPILLARY
GLUCOSE-CAPILLARY: 181 mg/dL — AB (ref 65–99)
GLUCOSE-CAPILLARY: 177 mg/dL — AB (ref 65–99)
Glucose-Capillary: 93 mg/dL (ref 65–99)
Glucose-Capillary: 136 mg/dL — ABNORMAL HIGH (ref 65–99)
Glucose-Capillary: 123 mg/dL — ABNORMAL HIGH (ref 65–99)

## 2017-01-27 LAB — TYPE AND SCREEN
ABO/RH(D): O POS
ANTIBODY SCREEN: NEGATIVE

## 2017-01-27 LAB — PROTIME-INR
INR: 1.12
PROTHROMBIN TIME: 14.5 s (ref 11.4–15.2)

## 2017-01-27 SURGERY — TOTAL HIP REVISION
Anesthesia: General | Site: Hip | Laterality: Right

## 2017-01-27 MED ORDER — FENTANYL CITRATE (PF) 100 MCG/2ML IJ SOLN
INTRAMUSCULAR | Status: AC
  Filled 2017-01-27: qty 2

## 2017-01-27 MED ORDER — HYDROMORPHONE HCL 1 MG/ML IJ SOLN
0.5000 mg | INTRAMUSCULAR | Status: DC | PRN
  Administered 2017-01-27: 0.5 mg via INTRAVENOUS
  Filled 2017-01-27: qty 0.5

## 2017-01-27 MED ORDER — HYDROMORPHONE HCL 1 MG/ML IJ SOLN
INTRAMUSCULAR | Status: AC
  Filled 2017-01-27: qty 1

## 2017-01-27 MED ORDER — HYDROMORPHONE HCL 2 MG PO TABS
2.0000 mg | ORAL_TABLET | ORAL | Status: DC | PRN
  Administered 2017-01-27: 4 mg via ORAL
  Administered 2017-01-27 (×2): 2 mg via ORAL
  Administered 2017-01-28 (×2): 4 mg via ORAL
  Administered 2017-01-28 – 2017-01-29 (×5): 2 mg via ORAL
  Administered 2017-01-29: 4 mg via ORAL
  Administered 2017-01-29: 2 mg via ORAL
  Filled 2017-01-27: qty 1
  Filled 2017-01-27 (×2): qty 2
  Filled 2017-01-27: qty 1
  Filled 2017-01-27: qty 2
  Filled 2017-01-27 (×2): qty 1
  Filled 2017-01-27: qty 2
  Filled 2017-01-27 (×4): qty 1

## 2017-01-27 MED ORDER — DEXAMETHASONE SODIUM PHOSPHATE 10 MG/ML IJ SOLN
10.0000 mg | Freq: Once | INTRAMUSCULAR | Status: AC
  Administered 2017-01-28: 10 mg via INTRAVENOUS
  Filled 2017-01-27: qty 1

## 2017-01-27 MED ORDER — POLYETHYLENE GLYCOL 3350 17 G PO PACK
17.0000 g | PACK | Freq: Every day | ORAL | Status: DC | PRN
  Administered 2017-01-28: 17 g via ORAL

## 2017-01-27 MED ORDER — METOCLOPRAMIDE HCL 5 MG/ML IJ SOLN: 5.0000 mg | Freq: Three times a day (TID) | INTRAMUSCULAR | Status: DC | PRN

## 2017-01-27 MED ORDER — TRAZODONE HCL 50 MG PO TABS
100.0000 mg | ORAL_TABLET | Freq: Every day | ORAL | Status: DC
  Administered 2017-01-27 – 2017-01-28 (×2): 100 mg via ORAL
  Filled 2017-01-27 (×2): qty 2

## 2017-01-27 MED ORDER — BISACODYL 10 MG RE SUPP: 10.0000 mg | Freq: Every day | RECTAL | Status: DC | PRN

## 2017-01-27 MED ORDER — SODIUM CHLORIDE 0.9 % IV SOLN
INTRAVENOUS | Status: DC | PRN
  Administered 2017-01-27: 2000 mg via TOPICAL

## 2017-01-27 MED ORDER — SUCCINYLCHOLINE CHLORIDE 200 MG/10ML IV SOSY
PREFILLED_SYRINGE | INTRAVENOUS | Status: DC | PRN
  Administered 2017-01-27: 100 mg via INTRAVENOUS

## 2017-01-27 MED ORDER — HYDROMORPHONE HCL 1 MG/ML IJ SOLN
INTRAMUSCULAR | Status: DC | PRN
  Administered 2017-01-27: 0.5 mg via INTRAVENOUS
  Administered 2017-01-27: .6 mg via INTRAVENOUS
  Administered 2017-01-27: .4 mg via INTRAVENOUS
  Administered 2017-01-27: 0.5 mg via INTRAVENOUS

## 2017-01-27 MED ORDER — PHENOL 1.4 % MT LIQD: 1.0000 | OROMUCOSAL | Status: DC | PRN

## 2017-01-27 MED ORDER — EPHEDRINE 5 MG/ML INJ
INTRAVENOUS | Status: AC
  Filled 2017-01-27: qty 10

## 2017-01-27 MED ORDER — STERILE WATER FOR IRRIGATION IR SOLN
Status: DC | PRN
  Administered 2017-01-27: 2000 mL

## 2017-01-27 MED ORDER — BUPIVACAINE HCL 0.25 % IJ SOLN
INTRAMUSCULAR | Status: DC | PRN
  Administered 2017-01-27: 30 mL

## 2017-01-27 MED ORDER — ONDANSETRON HCL 4 MG/2ML IJ SOLN: 4.0000 mg | Freq: Four times a day (QID) | INTRAMUSCULAR | Status: DC | PRN

## 2017-01-27 MED ORDER — TRAMADOL HCL 50 MG PO TABS: 50.0000 mg | ORAL_TABLET | Freq: Four times a day (QID) | ORAL | Status: DC | PRN

## 2017-01-27 MED ORDER — INSULIN ASPART 100 UNIT/ML ~~LOC~~ SOLN: 0.0000 [IU] | Freq: Three times a day (TID) | SUBCUTANEOUS | Status: DC

## 2017-01-27 MED ORDER — TRANEXAMIC ACID 1000 MG/10ML IV SOLN
2000.0000 mg | Freq: Once | INTRAVENOUS | Status: DC
  Filled 2017-01-27: qty 20

## 2017-01-27 MED ORDER — 0.9 % SODIUM CHLORIDE (POUR BTL) OPTIME
TOPICAL | Status: DC | PRN
  Administered 2017-01-27: 1000 mL

## 2017-01-27 MED ORDER — LACTATED RINGERS IV SOLN
INTRAVENOUS | Status: DC
Start: 2017-01-27 — End: 2017-01-27
  Administered 2017-01-27 (×2): via INTRAVENOUS

## 2017-01-27 MED ORDER — ATORVASTATIN CALCIUM 20 MG PO TABS
80.0000 mg | ORAL_TABLET | Freq: Every day | ORAL | Status: DC
  Administered 2017-01-27 – 2017-01-28 (×2): 80 mg via ORAL
  Filled 2017-01-27 (×2): qty 4

## 2017-01-27 MED ORDER — WARFARIN - PHARMACIST DOSING INPATIENT: Freq: Every day | Status: DC

## 2017-01-27 MED ORDER — SUCCINYLCHOLINE CHLORIDE 200 MG/10ML IV SOSY
PREFILLED_SYRINGE | INTRAVENOUS | Status: AC
  Filled 2017-01-27: qty 10

## 2017-01-27 MED ORDER — SUGAMMADEX SODIUM 200 MG/2ML IV SOLN
INTRAVENOUS | Status: AC
  Filled 2017-01-27: qty 2

## 2017-01-27 MED ORDER — FLEET ENEMA 7-19 GM/118ML RE ENEM: 1.0000 | ENEMA | Freq: Once | RECTAL | Status: DC | PRN

## 2017-01-27 MED ORDER — DOCUSATE SODIUM 100 MG PO CAPS
100.0000 mg | ORAL_CAPSULE | Freq: Two times a day (BID) | ORAL | Status: DC
  Administered 2017-01-27 – 2017-01-28 (×3): 100 mg via ORAL
  Filled 2017-01-27 (×3): qty 1

## 2017-01-27 MED ORDER — FENTANYL CITRATE (PF) 100 MCG/2ML IJ SOLN
INTRAMUSCULAR | Status: DC | PRN
  Administered 2017-01-27 (×4): 50 ug via INTRAVENOUS

## 2017-01-27 MED ORDER — ACETAMINOPHEN 650 MG RE SUPP: 650.0000 mg | Freq: Four times a day (QID) | RECTAL | Status: DC | PRN

## 2017-01-27 MED ORDER — PROMETHAZINE HCL 25 MG/ML IJ SOLN: 6.2500 mg | INTRAMUSCULAR | Status: DC | PRN

## 2017-01-27 MED ORDER — BUPIVACAINE LIPOSOME 1.3 % IJ SUSP
20.0000 mL | Freq: Once | INTRAMUSCULAR | Status: DC
  Filled 2017-01-27: qty 20

## 2017-01-27 MED ORDER — MENTHOL 3 MG MT LOZG: 1.0000 | LOZENGE | OROMUCOSAL | Status: DC | PRN

## 2017-01-27 MED ORDER — EPHEDRINE SULFATE 50 MG/ML IJ SOLN
INTRAMUSCULAR | Status: DC | PRN
  Administered 2017-01-27 (×2): 10 mg via INTRAVENOUS

## 2017-01-27 MED ORDER — ONDANSETRON HCL 4 MG/2ML IJ SOLN
INTRAMUSCULAR | Status: DC | PRN
  Administered 2017-01-27: 4 mg via INTRAVENOUS

## 2017-01-27 MED ORDER — ONDANSETRON HCL 4 MG/2ML IJ SOLN
INTRAMUSCULAR | Status: AC
  Filled 2017-01-27: qty 2

## 2017-01-27 MED ORDER — SUGAMMADEX SODIUM 200 MG/2ML IV SOLN
INTRAVENOUS | Status: DC | PRN
  Administered 2017-01-27: 200 mg via INTRAVENOUS

## 2017-01-27 MED ORDER — LIDOCAINE 2% (20 MG/ML) 5 ML SYRINGE
INTRAMUSCULAR | Status: AC
  Filled 2017-01-27: qty 5

## 2017-01-27 MED ORDER — ROCURONIUM BROMIDE 50 MG/5ML IV SOSY
PREFILLED_SYRINGE | INTRAVENOUS | Status: DC | PRN
  Administered 2017-01-27: 40 mg via INTRAVENOUS

## 2017-01-27 MED ORDER — MIDAZOLAM HCL 2 MG/2ML IJ SOLN
INTRAMUSCULAR | Status: AC
  Filled 2017-01-27: qty 2

## 2017-01-27 MED ORDER — BUPIVACAINE HCL (PF) 0.25 % IJ SOLN
INTRAMUSCULAR | Status: AC
  Filled 2017-01-27: qty 30

## 2017-01-27 MED ORDER — SODIUM CHLORIDE 0.9 % IJ SOLN
INTRAMUSCULAR | Status: AC
  Filled 2017-01-27: qty 50

## 2017-01-27 MED ORDER — HYDROMORPHONE HCL 1 MG/ML IJ SOLN
0.2500 mg | INTRAMUSCULAR | Status: DC | PRN
  Administered 2017-01-27 (×3): 0.5 mg via INTRAVENOUS

## 2017-01-27 MED ORDER — DEXAMETHASONE SODIUM PHOSPHATE 10 MG/ML IJ SOLN
INTRAMUSCULAR | Status: AC
Start: 2017-01-27 — End: 2017-01-27
  Filled 2017-01-27: qty 1

## 2017-01-27 MED ORDER — CEFAZOLIN SODIUM-DEXTROSE 2-4 GM/100ML-% IV SOLN
2.0000 g | INTRAVENOUS | Status: AC
  Administered 2017-01-27: 2 g via INTRAVENOUS

## 2017-01-27 MED ORDER — ENOXAPARIN SODIUM 30 MG/0.3ML ~~LOC~~ SOLN
30.0000 mg | Freq: Two times a day (BID) | SUBCUTANEOUS | Status: DC
  Administered 2017-01-28 – 2017-01-29 (×3): 30 mg via SUBCUTANEOUS
  Filled 2017-01-27 (×3): qty 0.3

## 2017-01-27 MED ORDER — METHOCARBAMOL 500 MG PO TABS
500.0000 mg | ORAL_TABLET | Freq: Four times a day (QID) | ORAL | Status: DC | PRN
  Administered 2017-01-28 (×3): 500 mg via ORAL
  Filled 2017-01-27 (×4): qty 1

## 2017-01-27 MED ORDER — SODIUM CHLORIDE 0.9 % IV SOLN
INTRAVENOUS | Status: DC
  Administered 2017-01-27: 100 mL/h via INTRAVENOUS
  Administered 2017-01-27 – 2017-01-28 (×2): via INTRAVENOUS

## 2017-01-27 MED ORDER — CEFAZOLIN SODIUM-DEXTROSE 2-4 GM/100ML-% IV SOLN
INTRAVENOUS | Status: AC
  Filled 2017-01-27: qty 100

## 2017-01-27 MED ORDER — PROPOFOL 10 MG/ML IV BOLUS
INTRAVENOUS | Status: AC
  Filled 2017-01-27: qty 40

## 2017-01-27 MED ORDER — METOCLOPRAMIDE HCL 5 MG PO TABS: 5.0000 mg | ORAL_TABLET | Freq: Three times a day (TID) | ORAL | Status: DC | PRN

## 2017-01-27 MED ORDER — MIDAZOLAM HCL 5 MG/5ML IJ SOLN
INTRAMUSCULAR | Status: DC | PRN
  Administered 2017-01-27: 2 mg via INTRAVENOUS

## 2017-01-27 MED ORDER — ACETAMINOPHEN 325 MG PO TABS: 650.0000 mg | ORAL_TABLET | Freq: Four times a day (QID) | ORAL | Status: DC | PRN

## 2017-01-27 MED ORDER — SODIUM CHLORIDE 0.9 % IR SOLN
Status: DC | PRN
Start: 2017-01-27 — End: 2017-01-27
  Administered 2017-01-27: 1000 mL

## 2017-01-27 MED ORDER — WARFARIN SODIUM 5 MG PO TABS
7.5000 mg | ORAL_TABLET | Freq: Once | ORAL | Status: AC
  Administered 2017-01-27: 7.5 mg via ORAL
  Filled 2017-01-27: qty 1

## 2017-01-27 MED ORDER — DEXAMETHASONE SODIUM PHOSPHATE 10 MG/ML IJ SOLN
10.0000 mg | Freq: Once | INTRAMUSCULAR | Status: AC
  Administered 2017-01-27: 10 mg via INTRAVENOUS

## 2017-01-27 MED ORDER — CHLORHEXIDINE GLUCONATE 4 % EX LIQD: 60.0000 mL | Freq: Once | CUTANEOUS | Status: DC

## 2017-01-27 MED ORDER — CEFAZOLIN SODIUM-DEXTROSE 2-4 GM/100ML-% IV SOLN
2.0000 g | Freq: Four times a day (QID) | INTRAVENOUS | Status: AC
  Administered 2017-01-27 (×2): 2 g via INTRAVENOUS
  Filled 2017-01-27 (×2): qty 100

## 2017-01-27 MED ORDER — LITHIUM CARBONATE 300 MG PO CAPS
600.0000 mg | ORAL_CAPSULE | Freq: Every day | ORAL | Status: DC
  Administered 2017-01-27 – 2017-01-28 (×2): 600 mg via ORAL
  Filled 2017-01-27 (×2): qty 2

## 2017-01-27 MED ORDER — ONDANSETRON HCL 4 MG PO TABS: 4.0000 mg | ORAL_TABLET | Freq: Four times a day (QID) | ORAL | Status: DC | PRN

## 2017-01-27 MED ORDER — SODIUM CHLORIDE 0.9 % IJ SOLN
INTRAMUSCULAR | Status: AC
  Filled 2017-01-27: qty 10

## 2017-01-27 MED ORDER — PROPOFOL 10 MG/ML IV BOLUS
INTRAVENOUS | Status: DC | PRN
  Administered 2017-01-27: 200 mg via INTRAVENOUS

## 2017-01-27 MED ORDER — DEXTROSE 5 % IV SOLN
500.0000 mg | Freq: Four times a day (QID) | INTRAVENOUS | Status: DC | PRN
  Administered 2017-01-27: 500 mg via INTRAVENOUS
  Filled 2017-01-27: qty 5
  Filled 2017-01-27: qty 550

## 2017-01-27 MED ORDER — DIPHENHYDRAMINE HCL 12.5 MG/5ML PO ELIX
12.5000 mg | ORAL_SOLUTION | ORAL | Status: DC | PRN
  Administered 2017-01-28: 25 mg via ORAL
  Filled 2017-01-27: qty 10

## 2017-01-27 MED ORDER — PREDNISOLONE ACETATE 1 % OP SUSP
2.0000 [drp] | Freq: Two times a day (BID) | OPHTHALMIC | Status: DC
  Administered 2017-01-27 – 2017-01-28 (×3): 2 [drp] via OPHTHALMIC
  Filled 2017-01-27: qty 1

## 2017-01-27 MED ORDER — ACETAMINOPHEN 10 MG/ML IV SOLN
INTRAVENOUS | Status: AC
  Filled 2017-01-27: qty 100

## 2017-01-27 MED ORDER — ACETAMINOPHEN 500 MG PO TABS
1000.0000 mg | ORAL_TABLET | Freq: Four times a day (QID) | ORAL | Status: AC
  Administered 2017-01-27 – 2017-01-28 (×4): 1000 mg via ORAL
  Filled 2017-01-27 (×4): qty 2

## 2017-01-27 MED ORDER — LITHIUM CARBONATE 300 MG PO CAPS
300.0000 mg | ORAL_CAPSULE | Freq: Every day | ORAL | Status: DC
  Administered 2017-01-28 – 2017-01-29 (×2): 300 mg via ORAL
  Filled 2017-01-27 (×3): qty 1

## 2017-01-27 MED ORDER — ROCURONIUM BROMIDE 50 MG/5ML IV SOSY
PREFILLED_SYRINGE | INTRAVENOUS | Status: AC
  Filled 2017-01-27: qty 5

## 2017-01-27 MED ORDER — ACETAMINOPHEN 10 MG/ML IV SOLN
1000.0000 mg | Freq: Once | INTRAVENOUS | Status: AC
  Administered 2017-01-27: 1000 mg via INTRAVENOUS

## 2017-01-27 MED ORDER — LIDOCAINE 2% (20 MG/ML) 5 ML SYRINGE
INTRAMUSCULAR | Status: DC | PRN
  Administered 2017-01-27: 100 mg via INTRAVENOUS

## 2017-01-27 MED ORDER — HYDROMORPHONE HCL 2 MG/ML IJ SOLN
INTRAMUSCULAR | Status: AC
  Filled 2017-01-27: qty 1

## 2017-01-27 SURGICAL SUPPLY — 72 items
ADAPTER BIOLOX TAPER +3 (Miscellaneous) ×2 IMPLANT
ARCOS ONE-PIECE REVISION SYSTEM, BIGHT OFFSET BROA (Hips) ×2 IMPLANT
BAG DECANTER FOR FLEXI CONT (MISCELLANEOUS) ×2 IMPLANT
BAG ZIPLOCK 12X15 (MISCELLANEOUS) ×6 IMPLANT
BEARING DUAL 28 54 HIP (Miscellaneous) ×2 IMPLANT
BIT DRILL 2.8X128 (BIT) ×2 IMPLANT
BLADE EXTENDED COATED 6.5IN (ELECTRODE) ×2 IMPLANT
BLADE SAW SAG 73X25 THK (BLADE) ×1
BLADE SAW SGTL 73X25 THK (BLADE) ×1 IMPLANT
BRUSH FEMORAL CANAL (MISCELLANEOUS) IMPLANT
DRAPE INCISE IOBAN 66X45 STRL (DRAPES) ×2 IMPLANT
DRAPE ORTHO SPLIT 77X108 STRL (DRAPES) ×2
DRAPE POUCH INSTRU U-SHP 10X18 (DRAPES) ×2 IMPLANT
DRAPE SURG ORHT 6 SPLT 77X108 (DRAPES) ×2 IMPLANT
DRAPE U-SHAPE 47X51 STRL (DRAPES) ×2 IMPLANT
DRSG ADAPTIC 3X8 NADH LF (GAUZE/BANDAGES/DRESSINGS) ×2 IMPLANT
DRSG MEPILEX BORDER 4X4 (GAUZE/BANDAGES/DRESSINGS) ×4 IMPLANT
DRSG MEPILEX BORDER 4X8 (GAUZE/BANDAGES/DRESSINGS) ×2 IMPLANT
DURAPREP 26ML APPLICATOR (WOUND CARE) ×2 IMPLANT
ELECT REM PT RETURN 9FT ADLT (ELECTROSURGICAL) ×2
ELECTRODE REM PT RTRN 9FT ADLT (ELECTROSURGICAL) ×1 IMPLANT
EVACUATOR 1/8 PVC DRAIN (DRAIN) ×2 IMPLANT
FACESHIELD WRAPAROUND (MASK) ×8 IMPLANT
GAUZE SPONGE 4X4 12PLY STRL (GAUZE/BANDAGES/DRESSINGS) ×2 IMPLANT
GLOVE BIO SURGEON STRL SZ7 (GLOVE) ×2 IMPLANT
GLOVE BIO SURGEON STRL SZ7.5 (GLOVE) ×2 IMPLANT
GLOVE BIO SURGEON STRL SZ8 (GLOVE) ×2 IMPLANT
GLOVE BIOGEL PI IND STRL 7.0 (GLOVE) ×1 IMPLANT
GLOVE BIOGEL PI IND STRL 7.5 (GLOVE) ×3 IMPLANT
GLOVE BIOGEL PI IND STRL 8 (GLOVE) ×3 IMPLANT
GLOVE BIOGEL PI INDICATOR 7.0 (GLOVE) ×1
GLOVE BIOGEL PI INDICATOR 7.5 (GLOVE) ×3
GLOVE BIOGEL PI INDICATOR 8 (GLOVE) ×3
GLOVE SURG SS PI 7.5 STRL IVOR (GLOVE) ×2 IMPLANT
GOWN SPEC L3 XXLG W/TWL (GOWN DISPOSABLE) ×2 IMPLANT
GOWN STRL REUS W/TWL LRG LVL3 (GOWN DISPOSABLE) ×4 IMPLANT
GOWN STRL REUS W/TWL XL LVL3 (GOWN DISPOSABLE) ×2 IMPLANT
HANDPIECE INTERPULSE COAX TIP (DISPOSABLE) ×1
HEAD CERAMIC DELTA 28MM 16/18 (Head) ×2 IMPLANT
HIP ARCOS 16X21 BRCH BODY HI (Orthopedic Implant) ×2 IMPLANT
IMMOBILIZER KNEE 20 (SOFTGOODS) ×2
IMMOBILIZER KNEE 20 THIGH 36 (SOFTGOODS) ×1 IMPLANT
MANIFOLD NEPTUNE II (INSTRUMENTS) ×2 IMPLANT
MARKER SKIN DUAL TIP RULER LAB (MISCELLANEOUS) ×2 IMPLANT
NDL SAFETY ECLIPSE 18X1.5 (NEEDLE) ×1 IMPLANT
NEEDLE HYPO 18GX1.5 SHARP (NEEDLE) ×1
PADDING CAST COTTON 6X4 STRL (CAST SUPPLIES) ×2 IMPLANT
PASSER SUT SWANSON 36MM LOOP (INSTRUMENTS) ×2 IMPLANT
POSITIONER SURGICAL ARM (MISCELLANEOUS) ×2 IMPLANT
PRESSURIZER FEMORAL UNIV (MISCELLANEOUS) IMPLANT
REVISION SYS HIP 16X21 BRCH (Orthopedic Implant) ×1 IMPLANT
SET HNDPC FAN SPRY TIP SCT (DISPOSABLE) ×1 IMPLANT
SPONGE LAP 18X18 X RAY DECT (DISPOSABLE) ×2 IMPLANT
STAPLER VISISTAT 35W (STAPLE) ×2 IMPLANT
STRIP CLOSURE SKIN 1/2X4 (GAUZE/BANDAGES/DRESSINGS) ×4 IMPLANT
SUCTION FRAZIER HANDLE 10FR (MISCELLANEOUS) ×1
SUCTION TUBE FRAZIER 10FR DISP (MISCELLANEOUS) ×1 IMPLANT
SUT ETHIBOND NAB CT1 #1 30IN (SUTURE) ×4 IMPLANT
SUT MNCRL AB 4-0 PS2 18 (SUTURE) ×2 IMPLANT
SUT VIC AB 1 CT1 27 (SUTURE) ×3
SUT VIC AB 1 CT1 27XBRD ANTBC (SUTURE) ×3 IMPLANT
SUT VIC AB 2-0 CT1 27 (SUTURE) ×3
SUT VIC AB 2-0 CT1 TAPERPNT 27 (SUTURE) ×3 IMPLANT
SUT VLOC 180 0 24IN GS25 (SUTURE) ×4 IMPLANT
SWAB COLLECTION DEVICE MRSA (MISCELLANEOUS) ×2 IMPLANT
SWAB CULTURE ESWAB REG 1ML (MISCELLANEOUS) IMPLANT
SYR 50ML LL SCALE MARK (SYRINGE) ×2 IMPLANT
TOWEL OR 17X26 10 PK STRL BLUE (TOWEL DISPOSABLE) ×4 IMPLANT
TOWER CARTRIDGE SMART MIX (DISPOSABLE) IMPLANT
TRAY FOLEY W/METER SILVER 16FR (SET/KITS/TRAYS/PACK) ×2 IMPLANT
TUBE KAMVAC SUCTION (TUBING) IMPLANT
YANKAUER SUCT BULB TIP 10FT TU (MISCELLANEOUS) ×2 IMPLANT

## 2017-01-27 NOTE — Anesthesia Preprocedure Evaluation (Signed)
Anesthesia Evaluation  Patient identified by MRN, date of birth, ID band Patient awake    Reviewed: Allergy & Precautions, NPO status , Patient's Chart, lab work & pertinent test results  Airway Mallampati: II  TM Distance: >3 FB Neck ROM: Full    Dental no notable dental hx.    Pulmonary neg pulmonary ROS, former smoker,    Pulmonary exam normal breath sounds clear to auscultation       Cardiovascular hypertension, Normal cardiovascular exam Rhythm:Regular Rate:Normal     Neuro/Psych CVA negative psych ROS   GI/Hepatic negative GI ROS, Neg liver ROS,   Endo/Other  diabetes  Renal/GU negative Renal ROS  negative genitourinary   Musculoskeletal negative musculoskeletal ROS (+)   Abdominal   Peds negative pediatric ROS (+)  Hematology negative hematology ROS (+)   Anesthesia Other Findings   Reproductive/Obstetrics negative OB ROS                             Anesthesia Physical Anesthesia Plan  ASA: III  Anesthesia Plan: General   Post-op Pain Management:    Induction: Intravenous  Airway Management Planned: Oral ETT  Additional Equipment:   Intra-op Plan:   Post-operative Plan: Extubation in OR  Informed Consent: I have reviewed the patients History and Physical, chart, labs and discussed the procedure including the risks, benefits and alternatives for the proposed anesthesia with the patient or authorized representative who has indicated his/her understanding and acceptance.   Dental advisory given  Plan Discussed with: CRNA and Surgeon  Anesthesia Plan Comments:         Anesthesia Quick Evaluation

## 2017-01-27 NOTE — Anesthesia Procedure Notes (Signed)
Procedure Name: Intubation Date/Time: 01/27/2017 7:16 AM Performed by: Maxwell Caul Pre-anesthesia Checklist: Patient identified, Emergency Drugs available, Suction available and Patient being monitored Patient Re-evaluated:Patient Re-evaluated prior to inductionOxygen Delivery Method: Circle system utilized Preoxygenation: Pre-oxygenation with 100% oxygen Intubation Type: IV induction Ventilation: Mask ventilation without difficulty Laryngoscope Size: Mac and 4 Grade View: Grade I Tube type: Oral Tube size: 7.5 mm Number of attempts: 1 Airway Equipment and Method: Stylet Placement Confirmation: ETT inserted through vocal cords under direct vision,  positive ETCO2 and breath sounds checked- equal and bilateral Secured at: 21 cm Tube secured with: Tape Dental Injury: Teeth and Oropharynx as per pre-operative assessment

## 2017-01-27 NOTE — Brief Op Note (Signed)
01/27/2017  8:42 AM  PATIENT:  [name] Hall  71 y.o. male  PRE-OPERATIVE DIAGNOSIS:  LOOSE RIGHT TOTAL HIP ARTHROPLASTY  POST-OPERATIVE DIAGNOSIS:  LOOSE RIGHT TOTAL HIP ARTHROPLASTY  PROCEDURE:  Procedure(s): RIGHT TOTAL HIP REVISION (Right)  SURGEON:  Surgeon(s) and Role:    * Gaynelle Arabian, MD - Primary  PHYSICIAN ASSISTANT:   ASSISTANTS: Arlee Muslim, PA-C   ANESTHESIA:   general  EBL:  Total I/O In: 1000 [I.V.:1000] Out: 950 [Urine:150; Blood:800]  BLOOD ADMINISTERED:none  DRAINS: (Medium) Hemovact drain(s) in the right hip with  Suction Open   LOCAL MEDICATIONS USED:  MARCAINE     COUNTS:  YES  TOURNIQUET:  * No tourniquets in log *  DICTATION: .Other Dictation: Dictation Number [id number]  PLAN OF CARE: Admit to inpatient   PATIENT DISPOSITION:  PACU - hemodynamically stable.

## 2017-01-27 NOTE — Progress Notes (Signed)
ANTICOAGULATION CONSULT NOTE - Initial Consult  Pharmacy Consult for Coumadin Indication: hx VTE, post-op VTE prophylaxis  Allergies  Allergen Reactions  . Altace [Ramipril] Hives  . Sulfonamide Derivatives Hives and Itching  . Oxycodone Itching and Rash    Patient Measurements: Height: 6' (182.9 cm) Weight: 209 lb (94.8 kg) IBW/kg (Calculated) : 77.6  Vital Signs: Temp: 98.1 F (36.7 C) (02/28 1015) Temp Source: Oral (02/28 1015) BP: 114/61 (02/28 1015) Pulse Rate: 73 (02/28 1015)  Labs: No results for input(s): HGB, HCT, PLT, APTT, LABPROT, INR, HEPARINUNFRC, HEPRLOWMOCWT, CREATININE, CKTOTAL, CKMB, TROPONINI in the last 72 hours.  Estimated Creatinine Clearance: 74.7 mL/min (by C-G formula based on SCr of 1.1 mg/dL).   Medical History: Past Medical History:  Diagnosis Date  . Diabetes mellitus    type 2  . DVT (deep venous thrombosis) ([place])   . Hyperlipidemia   . Hypertension   . Orthostatic hypotension   . Osteoarthritis   . PE (pulmonary embolism)   . PERSONAL HISTORY, VENOUS THROMBOSIS AND EMBOLISM   . Prostate CA [facility])    treated with radiation  . PROSTATE CANCER   . 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  This has been on hold since 2/22 with Lovenox injections in the interim.  His INR on 2/21 prior to stopping Coumadin was therapeutic.  He is s/p R THA today with plans to resume Lovenox-->Coumadin post-op.  01/27/2017:  INR at baseline (1.12) as expected since Coumadin has been on hold for 5 days  CHO modified diet ordered  No major drug interactions noted  CBC reviewed- Hg WNL, low baseline Pltc (134)    Goal of Therapy:  INR 2-3 Monitor platelets by anticoagulation protocol: Yes   Plan:  Resume Coumadin 7.5mg  PO x1 at 6pm Daily INR Lovenox 30mg  sq BID until INR>2 CBC q72h and Weekly Scr while on  Lovenox  Netta Cedars, PharmD, BCPS Pager: [phone] 01/27/2017,10:27 AM

## 2017-01-27 NOTE — Op Note (Signed)
[name] Hall, [name] Hall NO.:  [id number]  MEDICAL RECORD NO.:  [id number]  LOCATION:                                 FACILITY:  PHYSICIAN:  Gaynelle Arabian, M.D.         DATE OF BIRTH:  DATE OF PROCEDURE:  01/27/2017 DATE OF DISCHARGE:                              OPERATIVE REPORT   PREOPERATIVE DIAGNOSIS:  Loose right total hip arthroplasty.  POSTOPERATIVE DIAGNOSIS:  Loose right total hip arthroplasty.  PROCEDURE:  Right total hip arthroplasty revision.  SURGEON:  Gaynelle Arabian, M.D.  ASSISTANT:  Alexzandrew L. Perkins, P.A.C.  ANESTHESIA:  General.  ESTIMATED BLOOD LOSS:  800.  DRAINS:  Hemovac x1.  COMPLICATIONS:  None.  CONDITION:  Stable to recovery.  BRIEF CLINICAL NOTE:  [name] Hall is a 71 year old male, who had a right total hip arthroplasty done in [place] close to 10 years ago.  He has had progressively worsening weightbearing pain.  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  He had a metal-on-metal bearing surface.  He presents now for total hip arthroplasty revision.  PROCEDURE IN DETAIL:  After successful administration of general anesthetic, the patient was placed in left lateral decubitus position with the right side up and held with the hip positioner.  Right lower extremity was isolated from his perineum with plastic drapes and prepped and draped in the usual sterile fashion.  A short posterolateral incision was made with a 10 blade through subcutaneous tissue to the fascia lata which was incised in line with the skin incision.  Sciatic nerve was palpated and protected, and the posterior pseudocapsule was incised off the femur.  There was evidence of osteolytic debris in the joint.  We exposed enough to dislocate the hip.  While attempting to remove the femoral head off the neck, the entire stem came out, thus consistent with the loose femoral stem.  We  then retracted the femur anteriorly to gain acetabular exposure.  The acetabular retractors were placed.  Osteolytic debris was removed from around the rim of the acetabulum.  This was a Banker system with the metal acetabular shell, 60 mm in diameter.  He was in excellent position.  It was well fixed.  Thus, we did not need to change the shell.  We then went on to femoral preparation.  The canal was thoroughly irrigated, and the canal finder passed to demonstrate that there was axis distally passed where the stem was.  We began axial reaming for the Arcos revision stem.  Reamed up to 16 mm for placement of a 16 mm diameter stem.  We then broached proximally and got down to the resection length of the previous stem.  A high offset neck was placed with a 28 +3 head and the 54 mm head trial.  This was for the Active Articulation system.  The hip was reduced with excellent outstanding stability, full extension, full external rotation, 70 degrees flexion, 40 degrees adduction, 90 degrees internal rotation, 90 degrees of flexion, and 70 degrees of internal rotation.  We placed the right leg on top of the left,  and leg lengths were equal.  The hip was then reduced and the trials were removed.  We then impacted the permanent Arcos 1 piece revision stem which was 16 mm diameter x 210 mm length fully porous coated.  It was impacted into the femur in about 20 degrees of anteversion with fantastic fit.  We then placed the high offset +3 neck and a 28 mm ceramic head with the 54 mm dual mobility polyethylene bearings head.  Reduced the hip with the same stability parameters.  We took x-rays, AP and lateral, showing the stem to be within the femoral canal distally with no evidence of fracture and with excellent fit.  The wound was copiously irrigated with saline solution and the short rotators were reattached to the femur through drill holes with Ethibond suture.  A 30 mL of 0.25%  Marcaine was injected into subcu tissues and the gluteal muscles and the fascia lata.  The fascia lata was closed over Hemovac drain with a running #1 V-Loc suture.  We then injected the 2 g of tranexamic acid mixed with the 50 mL of saline.  The subcu was then closed with interrupted 2-0 Vicryl and subcuticular running 4-0 Monocryl.  Incision was cleaned and dried, and Steri-Strips and a bulky sterile dressing applied.  He was then awakened and transported to recovery in stable condition.  Please note that a surgical assistant was a medical necessity for this procedure to do it in a safe and expeditious manner.  Surgical assistant was necessary for retraction of vital neurovascular structures and for proper positioning of the limb to allow for safe and accurate removal of the old prosthesis as well as safe and accurate placement of the new prosthesis.     Gaynelle Arabian, M.D.     FA/MEDQ  D:  01/27/2017  T:  01/27/2017  Job:  [id number]

## 2017-01-27 NOTE — Anesthesia Postprocedure Evaluation (Addendum)
Anesthesia Post Note  Patient: [name] Hall  Procedure(s) Performed: Procedure(s) (LRB): RIGHT TOTAL HIP REVISION (Right)  Patient location during evaluation: PACU Anesthesia Type: General Level of consciousness: awake and alert Pain management: pain level controlled Vital Signs Assessment: post-procedure vital signs reviewed and stable Respiratory status: spontaneous breathing, nonlabored ventilation, respiratory function stable and patient connected to nasal cannula oxygen Cardiovascular status: blood pressure returned to baseline and stable Postop Assessment: no signs of nausea or vomiting Anesthetic complications: no       Last Vitals:  Vitals:   01/27/17 0915 01/27/17 0930  BP: 139/71 133/65  Pulse: 81 79  Resp: 15 14  Temp:      Last Pain:  Vitals:   01/27/17 0930  TempSrc:   PainSc: 6                  , S

## 2017-01-27 NOTE — Transfer of Care (Signed)
Immediate Anesthesia Transfer of Care Note  Patient: [name] Hall  Procedure(s) Performed: Procedure(s): RIGHT TOTAL HIP REVISION (Right)  Patient Location: PACU  Anesthesia Type:General  Level of Consciousness:  sedated, patient cooperative and responds to stimulation  Airway & Oxygen Therapy:Patient Spontanous Breathing and Patient connected to face mask oxgen  Post-op Assessment:  Report given to PACU RN and Post -op Vital signs reviewed and stable  Post vital signs:  Reviewed and stable  Last Vitals:  Vitals:   01/27/17 0531 01/27/17 0904  BP: 124/69 (!) 165/79  Pulse: 80 77  Resp: 16 10  Temp: 36.5 C (P) [id number]    Complications: No apparent anesthesia complications

## 2017-01-27 NOTE — Evaluation (Signed)
Physical Therapy Evaluation Patient Details Name: [name] Hall MRN: [id number] DOB: November 25, 1946 Today's Date: 01/27/2017   History of Present Illness  s/p R posterior THA revision  Clinical Impression  Pt is s/p THA resulting in the deficits listed below (see PT Problem List). * Pt will benefit from skilled PT to increase their independence and safety with mobility to allow discharge to the venue listed below.      Follow Up Recommendations Home health PT;Outpatient PT (per MD)    Equipment Recommendations  None recommended by PT    Recommendations for Other Services       Precautions / Restrictions Precautions Precautions: Fall;Posterior Hip Restrictions Other Position/Activity Restrictions: WBAT      Mobility  Bed Mobility Overal bed mobility: Needs Assistance Bed Mobility: Supine to Sit     Supine to sit: Min assist     General bed mobility comments: assist with RLE  Transfers Overall transfer level: Needs assistance Equipment used: Rolling walker (2 wheeled) Transfers: Sit to/from Stand Sit to Stand: Min assist         General transfer comment: cues for hand placement  Ambulation/Gait Ambulation/Gait assistance: Min assist Ambulation Distance (Feet): 20 Feet Assistive device: Rolling walker (2 wheeled) Gait Pattern/deviations: Step-to pattern;Antalgic     General Gait Details: cues for sequence and wt shift to UEs for pain control  Stairs            Wheelchair Mobility    Modified Rankin (Stroke Patients Only)       Balance                                             Pertinent Vitals/Pain Pain Assessment: 0-10 Pain Score: 2  Pain Location: hip Pain Descriptors / Indicators: Aching;Burning;Sore Pain Intervention(s): Limited activity within patient's tolerance;Monitored during session;Premedicated before session    [place] expects to be discharged to:: Private residence Living Arrangements:  Spouse/significant other Available Help at Discharge: Available 24 hours/day Type of Home: House Home Access: Stairs to enter   [employer] of Steps: 2 Home Layout: One level Home Equipment: [place] - 2 wheels;Bedside commode      Prior Function Level of Independence: Independent               Hand Dominance        Extremity/Trunk Assessment   Upper Extremity Assessment Upper Extremity Assessment: Defer to OT evaluation;Overall WFL for tasks assessed    Lower Extremity Assessment Lower Extremity Assessment: RLE deficits/detail RLE Deficits / Details: ankle WFL; hip grossly 2+/5; knee 3/5       Communication   Communication: No difficulties  Cognition Arousal/Alertness: Awake/alert Behavior During Therapy: WFL for tasks assessed/performed Overall Cognitive Status: Within Functional Limits for tasks assessed                      General Comments      Exercises Total Joint Exercises Ankle Circles/Pumps: AROM;Both;10 reps Quad Sets: 10 reps;Both;AROM   Assessment/Plan    PT Assessment Patient needs continued PT services  PT Problem List Decreased strength;Decreased range of motion;Decreased activity tolerance;Decreased mobility;Decreased knowledge of use of DME;Decreased safety awareness;Pain       PT Treatment Interventions DME instruction;Gait training;Stair training;Functional mobility training;Therapeutic activities;Therapeutic exercise    PT Goals (Current goals can be found in the Care Plan section)  Acute Rehab  PT Goals Patient Stated Goal: less pain with walking PT Goal Formulation: With patient Time For Goal Achievement: 02/03/17 Potential to Achieve Goals: Good    Frequency 7X/week   Barriers to discharge        Co-evaluation               End of Session Equipment Utilized During Treatment: Gait belt Activity Tolerance: Patient tolerated treatment well Patient left: in chair;with call bell/phone within  reach;with chair alarm set;with family/visitor present   PT Visit Diagnosis: Other abnormalities of gait and mobility (R26.89)         Time: [id number] PT Time Calculation (min) (ACUTE ONLY): 15 min   Charges:   PT Evaluation $PT Eval Low Complexity: 1 Procedure     PT G Codes:         , 05-Feb-2017, 5:12 PM

## 2017-01-28 ENCOUNTER — Encounter (HOSPITAL_COMMUNITY): Payer: Self-pay | Admitting: Orthopedic Surgery

## 2017-01-28 LAB — BASIC METABOLIC PANEL
ANION GAP: 4 — AB (ref 5–15)
BUN: 16 mg/dL (ref 6–20)
CALCIUM: 8.9 mg/dL (ref 8.9–10.3)
CO2: 25 mmol/L (ref 22–32)
Chloride: 107 mmol/L (ref 101–111)
Creatinine, Ser: 0.85 mg/dL (ref 0.61–1.24)
GFR calc Af Amer: >60 mL/min (ref >60)
GLUCOSE: 121 mg/dL — AB (ref 65–99)
Potassium: 4.7 mmol/L (ref 3.5–5.1)
Sodium: 136 mmol/L (ref 135–145)

## 2017-01-28 LAB — CBC
HCT: 33.9 % — ABNORMAL LOW (ref 39.0–52.0)
Hemoglobin: 11 g/dL — ABNORMAL LOW (ref 13.0–17.0)
MCH: 29.1 pg (ref 26.0–34.0)
MCHC: 32.4 g/dL (ref 30.0–36.0)
MCV: 89.7 fL (ref 78.0–100.0)
PLATELETS: 128 10*3/uL — AB (ref 150–400)
RBC: 3.78 MIL/uL — ABNORMAL LOW (ref 4.22–5.81)
RDW: 13.1 % (ref 11.5–15.5)
WBC: 8.4 10*3/uL (ref 4.0–10.5)

## 2017-01-28 LAB — PROTIME-INR
INR: 1.16
Prothrombin Time: 14.8 seconds (ref 11.4–15.2)

## 2017-01-28 LAB — GLUCOSE, CAPILLARY
Glucose-Capillary: 106 mg/dL — ABNORMAL HIGH (ref 65–99)
Glucose-Capillary: 118 mg/dL — ABNORMAL HIGH (ref 65–99)
Glucose-Capillary: 136 mg/dL — ABNORMAL HIGH (ref 65–99)
Glucose-Capillary: 194 mg/dL — ABNORMAL HIGH (ref 65–99)

## 2017-01-28 MED ORDER — WARFARIN SODIUM 5 MG PO TABS
7.5000 mg | ORAL_TABLET | Freq: Once | ORAL | Status: AC
  Administered 2017-01-28: 7.5 mg via ORAL
  Filled 2017-01-28: qty 1

## 2017-01-28 MED ORDER — ALUM & MAG HYDROXIDE-SIMETH 200-200-20 MG/5ML PO SUSP
30.0000 mL | ORAL | Status: DC | PRN
  Administered 2017-01-28 – 2017-01-29 (×2): 30 mL via ORAL
  Filled 2017-01-28 (×2): qty 30

## 2017-01-28 NOTE — Progress Notes (Signed)
[place] for Coumadin Indication: hx VTE, post-op VTE prophylaxis  Allergies  Allergen Reactions  . Altace [Ramipril] Hives  . Sulfonamide Derivatives Hives and Itching  . Oxycodone Itching and Rash    Patient Measurements: Height: 6' (182.9 cm) Weight: 209 lb (94.8 kg) IBW/kg (Calculated) : 77.6  Vital Signs: Temp: 98.7 F (37.1 C) (03/01 0630) Temp Source: Oral (03/01 0630) BP: 121/63 (03/01 0630) Pulse Rate: 72 (03/01 0630)  Labs:  Recent Labs  01/27/17 1103 01/28/17 0410  HGB  --  11.0*  HCT  --  33.9*  PLT  --  128*  LABPROT 14.5 14.8  INR 1.12 1.16  CREATININE  --  0.85    Estimated Creatinine Clearance: 96.7 mL/min (by C-G formula based on SCr of 0.85 mg/dL).   Medical History: Past Medical History:  Diagnosis Date  . Diabetes mellitus    type 2  . DVT (deep venous thrombosis) ([place])   . Hyperlipidemia   . Hypertension   . Orthostatic hypotension   . Osteoarthritis   . PE (pulmonary embolism)   . PERSONAL HISTORY, VENOUS THROMBOSIS AND EMBOLISM   . Prostate CA [facility])    treated with radiation  . PROSTATE CANCER   . 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  This has been on hold since 2/22 with Lovenox injections in the interim.  His INR on 2/21 prior to stopping Coumadin was therapeutic.  He is s/p R THA today with plans to resume Lovenox-->Coumadin post-op.  01/28/2017:  Minimal INR response to initial warfarin dose, as expected  CHO modified diet ordered, eating 100%  No major drug interactions noted  Hgb > 10  Pltc slightly low but near baseline value of 134K  SCr WNL, CrCl well above 30 mL/min     Goal of Therapy:  INR 2-3 Monitor platelets by anticoagulation protocol: Yes   Plan:  Warfarin 7.5 mg PO today at 6pm Daily INR Lovenox 30mg  sq BID until INR  2 or above CBC at least q72h and SCr at least weekly while on Lovenox  Clayburn Pert, PharmD, BCPS Pager: [phone] 01/28/2017  10:51 AM

## 2017-01-28 NOTE — Evaluation (Signed)
Occupational Therapy Evaluation Patient Details Name: [name] Hall MRN: [id number] DOB: 11-11-1946 Today's Date: 01/28/2017    History of Present Illness s/p R posterior THA revision   Clinical Impression   This 71 year old man was admitted for the above sx. All education was completed. No further OT is needed at this time    Follow Up Recommendations  No OT follow up;Supervision/Assistance - 24 hour    Equipment Recommendations  None recommended by OT    Recommendations for Other Services       Precautions / Restrictions Precautions Precautions: Fall;Posterior Hip Precaution Comments: sign in room and another handout given Restrictions Weight Bearing Restrictions: No Other Position/Activity Restrictions: WBAT      Mobility Bed Mobility Overal bed mobility: Needs Assistance Bed Mobility: Supine to Sit     Supine to sit: Min guard Sit to supine: Min assist   General bed mobility comments: assist for RLE back to bed so that he wouldn't IR  Transfers   Equipment used: Rolling walker (2 wheeled)   Sit to Stand: Min guard         General transfer comment: for safety. Cues for UE/LE placement    Balance                                            ADL Overall ADL's : Needs assistance/impaired             Lower Body Bathing: Minimal assistance;Sit to/from stand;With adaptive equipment       Lower Body Dressing: Minimal assistance;Sit to/from stand;With adaptive equipment (no socks)   Toilet Transfer: Min guard;Ambulation;BSC;RW   Toileting- Water quality scientist and Hygiene: Min guard;Sit to/from stand   Tub/ Shower Transfer: Min guard;Ambulation;Walk-in shower     General ADL Comments: pt is familiar with AE.  He had last hip sx in '06. No longer has reacher and may replace this.  He feels that he will go without socks and he has secure slip on shoes.  Reviewed posterior THPS in detail related to ADLs.       Vision        Perception     Praxis      Pertinent Vitals/Pain Pain Assessment: 0-10 Pain Score: 2  Pain Location: R hip Pain Descriptors / Indicators: Aching Pain Intervention(s): Limited activity within patient's tolerance;Monitored during session;Premedicated before session;Repositioned     Hand Dominance     Extremity/Trunk Assessment Upper Extremity Assessment Upper Extremity Assessment: Overall WFL for tasks assessed           Communication Communication Communication: No difficulties   Cognition Arousal/Alertness: Awake/alert Behavior During Therapy: WFL for tasks assessed/performed Overall Cognitive Status: Within Functional Limits for tasks assessed                     General Comments       Exercises       Shoulder Instructions      Home Living Family/patient expects to be discharged to:: Private residence Living Arrangements: Spouse/significant other Available Help at Discharge: Available 24 hours/day Type of Home: House             Bathroom Shower/Tub: Walk-in shower   Bathroom Toilet: Handicapped height     Home Equipment: Environmental consultant - 2 wheels;Bedside commode;Shower seat          Prior Functioning/Environment Level of Independence: Independent  OT Problem List:        OT Treatment/Interventions:      OT Goals(Current goals can be found in the care plan section) Acute Rehab OT Goals Patient Stated Goal: less pain with walking OT Goal Formulation: All assessment and education complete, DC therapy  OT Frequency:     Barriers to D/C:            Co-evaluation              End of Session    Activity Tolerance: Patient tolerated treatment well Patient left: in bed;with call bell/phone within reach (requested 4 rails)  OT Visit Diagnosis: Pain Pain - Right/Left: Right Pain - part of body: Hip                ADL either performed or assessed with clinical judgement  Time: 1348-1405 OT Time Calculation  (min): 17 min Charges:  OT General Charges $OT Visit: 1 Procedure OT Evaluation $OT Eval Low Complexity: 1 Procedure G-Codes:     [place], OTR/L [id number] 01/28/2017  , 01/28/2017, 3:07 PM

## 2017-01-28 NOTE — Progress Notes (Signed)
Physical Therapy Treatment Patient Details Name: [name] Hall MRN: [id number] DOB: 04-Feb-1946 Today's Date: 01/28/2017    History of Present Illness s/p R posterior THA revision    PT Comments    POD # 1 am session Assisted pt OOB to amb an increased distance in hallway.  Minimal pain.  Then returned to room to perform some THR TE's followed by ICE.    Follow Up Recommendations  Home health PT     Equipment Recommendations  None recommended by PT    Recommendations for Other Services       Precautions / Restrictions Precautions Precautions: Fall;Posterior Hip Precaution Comments: sign in room and another handout given Restrictions Weight Bearing Restrictions: No Other Position/Activity Restrictions: WBAT    Mobility  Bed Mobility Overal bed mobility: Needs Assistance Bed Mobility: Supine to Sit     Supine to sit: Min assist     General bed mobility comments: assist with RLE and 25% VC's on proper tech to increase self assist  Transfers                    Ambulation/Gait Ambulation/Gait assistance: Min assist Ambulation Distance (Feet): 60 Feet Assistive device: Rolling walker (2 wheeled) Gait Pattern/deviations: Step-to pattern;Antalgic Gait velocity: decreased   General Gait Details: 25% VC's on proper gait sequencing and safety with turns   Science writer    Modified Rankin (Stroke Patients Only)       Balance                                    Cognition Arousal/Alertness: Awake/alert Behavior During Therapy: WFL for tasks assessed/performed Overall Cognitive Status: Within Functional Limits for tasks assessed                      Exercises   Total Hip Replacement TE's 10 reps ankle pumps 10 reps knee presses 10 reps heel slides 10 reps SAQ's 10 reps ABD Followed by ICE     General Comments        Pertinent Vitals/Pain Pain Assessment: 0-10 Pain Score: 2  Pain  Location: R hip Pain Descriptors / Indicators: Aching;Burning;Sore;Operative site guarding Pain Intervention(s): Monitored during session;Repositioned;Ice applied    Home Living                      Prior Function            PT Goals (current goals can now be found in the care plan section) Progress towards PT goals: Progressing toward goals    Frequency    7X/week      PT Plan Current plan remains appropriate    Co-evaluation             End of Session Equipment Utilized During Treatment: Gait belt Activity Tolerance: Patient tolerated treatment well Patient left: in chair;with call bell/phone within reach;with chair alarm set;with family/visitor present   PT Visit Diagnosis: Other abnormalities of gait and mobility (R26.89)     Time: [id number] PT Time Calculation (min) (ACUTE ONLY): 26 min  Charges:  $Gait Training: 8-22 mins $Therapeutic Exercise: 8-22 mins                    G Codes:          PTA [facility]  Acute  Rehab Pager      [phone]

## 2017-01-28 NOTE — Care Management Note (Signed)
Case Management Note  Patient Details  Name: [name] Hall MRN: [id number] Date of Birth: 1945/12/19  Subjective/Objective:                  RIGHT TOTAL HIP REVISION (Right) Action/Plan: Discharge planning Expected Discharge Date:                  Expected Discharge Plan:  [place]  In-House Referral:     Discharge planning Services  CM Consult  Post Acute Care Choice:  Home Health Choice offered to:  Patient, Spouse  DME Arranged:  N/A DME Agency:  NA  HH Arranged:  PT, RN [place] Agency:  [facility] (formerly [employer])  Status of Service:  Completed, signed off  If discussed at [employer], dates discussed:    Additional Comments: CM met with pt in room to offer choice of home health agency. Pt chooses [facility] to render HHPT/RN (INR checks).  Referral called to [facility] rep, Tim. Order for INR sticks on sticky note to MD and charge RN is aware.  Pt has all DME needed at home. No other CM needs were communicated. Dellie Catholic, RN 01/28/2017, 12:07 PM

## 2017-01-28 NOTE — Progress Notes (Addendum)
   Subjective: 1 Day Post-Op Procedure(s) (LRB): RIGHT TOTAL HIP REVISION (Right) Patient reports pain as mild.   Patient seen in rounds with Dr. Wynelle Link. Patient is well, but has had some minor complaints of pain in the hip, requiring pain medications We will resume therapy today.  He walked 20 feet day of surgery Plan is to go Home after hospital stay.  Objective: Vital signs in last 24 hours: Temp:  [97.4 F (36.3 C)-98.7 F (37.1 C)] 98.7 F (37.1 C) (03/01 0630) Pulse Rate:  [69-79] 72 (03/01 0630) Resp:  [12-16] 16 (03/01 0630) BP: (107-140)/(56-84) 121/63 (03/01 0630) SpO2:  [96 %-100 %] 99 % (03/01 0630) Weight:  [94.8 kg (209 lb)] 94.8 kg (209 lb) (02/28 1015)  Intake/Output from previous day:  Intake/Output Summary (Last 24 hours) at 01/28/17 0922 Last data filed at 01/28/17 [id number]  Gross per 24 hour  Intake          3426.67 ml  Output             4247 ml  Net          -820.33 ml    Intake/Output this shift: Total I/O In: -  Out: 200 [Urine:200]  Labs:  Recent Labs  01/28/17 0410  HGB 11.0*    Recent Labs  01/28/17 0410  WBC 8.4  RBC 3.78*  HCT 33.9*  PLT 128*    Recent Labs  01/28/17 0410  NA 136  K 4.7  CL 107  CO2 25  BUN 16  CREATININE 0.85  GLUCOSE 121*  CALCIUM 8.9    Recent Labs  01/27/17 1103 01/28/17 0410  INR 1.12 1.16    EXAM General - Patient is Alert, Appropriate and Oriented Extremity - Neurovascular intact Sensation intact distally Intact pulses distally Dorsiflexion/Plantar flexion intact Dressing - dressing C/D/I Motor Function - intact, moving foot and toes well on exam.  Hemovac pulled without difficulty.  Past Medical History:  Diagnosis Date  . Diabetes mellitus    type 2  . DVT (deep venous thrombosis) ([place])   . Hyperlipidemia   . Hypertension   . Orthostatic hypotension   . Osteoarthritis   . PE (pulmonary embolism)   . PERSONAL HISTORY, VENOUS THROMBOSIS AND EMBOLISM   . Prostate CA [facility])    treated with radiation  . PROSTATE CANCER   . Stroke ([place])    TIA     Assessment/Plan: 1 Day Post-Op Procedure(s) (LRB): RIGHT TOTAL HIP REVISION (Right) Active Problems:   Failed total hip arthroplasty, subsequent encounter  Estimated body mass index is 28.35 kg/m as calculated from the following:   Height as of this encounter: 6' (1.829 m).   Weight as of this encounter: 94.8 kg (209 lb). Up with therapy Plan for discharge tomorrow Discharge home with home health  DVT Prophylaxis - Lovenox and Coumadin, on warfarin at home normally. INR - 1.16 today HGB 11.0 Weight Bearing As Tolerated right Leg D/C Knee Immobilizer Hemovac Pulled Begin Therapy Hip Preacutions  He has equipment at home already from previous surgery.  Arlee Muslim, PA-C Orthopaedic Surgery 01/28/2017, 9:22 AM

## 2017-01-28 NOTE — Progress Notes (Signed)
Physical Therapy Treatment Patient Details Name: [name] Hall MRN: [id number] DOB: 11/14/1946 Today's Date: 01/28/2017    History of Present Illness s/p R posterior THA revision    PT Comments    POD # 1 pm session Assisted OOB to amb a greater distance in hallway, practiced 2 steps then assisted back to bed.  Follow Up Recommendations  Home health PT     Equipment Recommendations  None recommended by PT    Recommendations for Other Services       Precautions / Restrictions Precautions Precautions: Fall;Posterior Hip Precaution Comments: sign in room and another handout given Restrictions Weight Bearing Restrictions: No Other Position/Activity Restrictions: WBAT    Mobility  Bed Mobility Overal bed mobility: Needs Assistance Bed Mobility: Supine to Sit     Supine to sit: Min guard Sit to supine: Min assist   General bed mobility comments: assist for RLE back to bed so that he wouldn't IR  Transfers   Equipment used: Rolling walker (2 wheeled)   Sit to Stand: Min guard         General transfer comment: for safety. Cues for UE/LE placement  Ambulation/Gait Ambulation/Gait assistance: Min assist Ambulation Distance (Feet): 85  Feet Assistive device: Rolling walker (2 wheeled) Gait Pattern/deviations: Step-to pattern;Antalgic Gait velocity: decreased   General Gait Details: 25% VC's on proper gait sequencing and safety with turns   Stairs    2 steps forward        Wheelchair Mobility    Modified Rankin (Stroke Patients Only)       Balance                                    Cognition Arousal/Alertness: Awake/alert Behavior During Therapy: WFL for tasks assessed/performed Overall Cognitive Status: Within Functional Limits for tasks assessed                      Exercises      General Comments        Pertinent Vitals/Pain Pain Assessment: 0-10 Pain Score: 2  Pain Location: R hip Pain Descriptors /  Indicators: Aching Pain Intervention(s): Limited activity within patient's tolerance;Monitored during session;Premedicated before session;Repositioned    Home Living Family/patient expects to be discharged to:: Private residence Living Arrangements: Spouse/significant other Available Help at Discharge: Available 24 hours/day Type of Home: House       Home Equipment: Gilford Rile - 2 wheels;Bedside commode;Shower seat      Prior Function Level of Independence: Independent          PT Goals (current goals can now be found in the care plan section) Acute Rehab PT Goals Patient Stated Goal: less pain with walking Progress towards PT goals: Progressing toward goals    Frequency    7X/week      PT Plan Current plan remains appropriate    Co-evaluation             End of Session Equipment Utilized During Treatment: Gait belt Activity Tolerance: Patient tolerated treatment well Patient left: in chair;with call bell/phone within reach;with chair alarm set;with family/visitor present   PT Visit Diagnosis: Other abnormalities of gait and mobility (R26.89)     Time: [id number] PT Time Calculation (min) (ACUTE ONLY): 20 min  Charges:  $Gait Training: 8-22 mins                     G  Codes:       Rica Koyanagi  PTA [facility]  Acute  Rehab Pager      [phone]

## 2017-01-29 LAB — BASIC METABOLIC PANEL
Anion gap: 3 — ABNORMAL LOW (ref 5–15)
BUN: 17 mg/dL (ref 6–20)
CHLORIDE: 105 mmol/L (ref 101–111)
CO2: 29 mmol/L (ref 22–32)
Calcium: 9 mg/dL (ref 8.9–10.3)
Creatinine, Ser: 0.89 mg/dL (ref 0.61–1.24)
GFR calc Af Amer: >60 mL/min (ref >60)
GFR calc non Af Amer: >60 mL/min (ref >60)
Glucose, Bld: 123 mg/dL — ABNORMAL HIGH (ref 65–99)
POTASSIUM: 4.8 mmol/L (ref 3.5–5.1)
Sodium: 137 mmol/L (ref 135–145)

## 2017-01-29 LAB — CBC
HCT: 34.2 % — ABNORMAL LOW (ref 39.0–52.0)
HEMOGLOBIN: 11.2 g/dL — AB (ref 13.0–17.0)
MCH: 29.3 pg (ref 26.0–34.0)
MCHC: 32.7 g/dL (ref 30.0–36.0)
MCV: 89.5 fL (ref 78.0–100.0)
Platelets: 131 10*3/uL — ABNORMAL LOW (ref 150–400)
RBC: 3.82 MIL/uL — AB (ref 4.22–5.81)
RDW: 13.6 % (ref 11.5–15.5)
WBC: 8.8 10*3/uL (ref 4.0–10.5)

## 2017-01-29 LAB — PROTIME-INR
INR: 1.2
Prothrombin Time: 15.2 seconds (ref 11.4–15.2)

## 2017-01-29 LAB — GLUCOSE, CAPILLARY
GLUCOSE-CAPILLARY: 82 mg/dL (ref 65–99)
Glucose-Capillary: 122 mg/dL — ABNORMAL HIGH (ref 65–99)

## 2017-01-29 MED ORDER — WARFARIN SODIUM 5 MG PO TABS
7.5000 mg | ORAL_TABLET | Freq: Once | ORAL | Status: AC
  Administered 2017-01-29: 7.5 mg via ORAL
  Filled 2017-01-29: qty 1

## 2017-01-29 MED ORDER — METHOCARBAMOL 500 MG PO TABS: 500.0000 mg | ORAL_TABLET | Freq: Four times a day (QID) | ORAL | 0 refills | Status: DC | PRN

## 2017-01-29 MED ORDER — HYDROMORPHONE HCL 2 MG PO TABS: 2.0000 mg | ORAL_TABLET | ORAL | 0 refills | Status: DC | PRN

## 2017-01-29 MED ORDER — ENOXAPARIN SODIUM 40 MG/0.4ML ~~LOC~~ SOLN: 40.0000 mg | SUBCUTANEOUS | 0 refills | Status: DC

## 2017-01-29 MED ORDER — TRAMADOL HCL 50 MG PO TABS: 50.0000 mg | ORAL_TABLET | Freq: Four times a day (QID) | ORAL | 0 refills | Status: DC | PRN

## 2017-01-29 NOTE — Progress Notes (Signed)
[facility] for Coumadin Indication: hx VTE, post-op VTE prophylaxis  Allergies  Allergen Reactions  . Altace [Ramipril] Hives  . Sulfonamide Derivatives Hives and Itching  . Oxycodone Itching and Rash    Patient Measurements: Height: 6' (182.9 cm) Weight: 209 lb (94.8 kg) IBW/kg (Calculated) : 77.6  Vital Signs: Temp: 98 F (36.7 C) (03/02 0901) Temp Source: Oral (03/02 0901) BP: 111/65 (03/02 0901) Pulse Rate: 97 (03/02 0901)  Labs:  Recent Labs  01/27/17 1103 01/28/17 0410 01/29/17 0513  HGB  --  11.0* 11.2*  HCT  --  33.9* 34.2*  PLT  --  128* 131*  LABPROT 14.5 14.8 15.2  INR 1.12 1.16 1.20  CREATININE  --  0.85 0.89    Estimated Creatinine Clearance: 92.3 mL/min (by C-G formula based on SCr of 0.89 mg/dL).   Medical History: Past Medical History:  Diagnosis Date  . Diabetes mellitus    type 2  . DVT (deep venous thrombosis) ([place])   . Hyperlipidemia   . Hypertension   . Orthostatic hypotension   . Osteoarthritis   . PE (pulmonary embolism)   . PERSONAL HISTORY, VENOUS THROMBOSIS AND EMBOLISM   . Prostate CA [facility])    treated with radiation  . PROSTATE CANCER   . 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  This has been on hold since 2/22 with Lovenox injections in the interim.  His INR on 2/21 prior to stopping Coumadin was therapeutic.  He is s/p R THA today with plans to resume Lovenox-->Coumadin post-op.  01/29/2017:  INR subtherapeutic as expected after 2 doses of 7.5mg  warfarin  CHO modified diet ordered, eating 100%  No major drug interactions noted  Labs stable, no reported bleeding  SCr WNL, CrCl well above 30 mL/min     Goal of Therapy:  INR 2-3 Monitor platelets by anticoagulation protocol: Yes   Plan:  Repeat Warfarin 7.5 mg PO today at 6pm Daily  INR Lovenox 30mg  sq BID until INR 2 or above CBC at least q72h and SCr at least weekly while on Lovenox   Adrian Saran, PharmD, BCPS Pager [phone] 01/29/2017 10:22 AM

## 2017-01-29 NOTE — Discharge Summary (Signed)
Physician Discharge Summary   Patient ID: [name] Hall MRN: [id number] DOB/AGE: Oct 20, 1946 71 y.o.  Admit date: 01/27/2017 Discharge date: 01/29/2017  Primary Diagnosis:  Loose right total hip arthroplasty. Admission Diagnoses:  Past Medical History:  Diagnosis Date  . Diabetes mellitus    type 2  . DVT (deep venous thrombosis) ([place])   . Hyperlipidemia   . Hypertension   . Orthostatic hypotension   . Osteoarthritis   . PE (pulmonary embolism)   . PERSONAL HISTORY, VENOUS THROMBOSIS AND EMBOLISM   . Prostate CA [facility])    treated with radiation  . PROSTATE CANCER   . Stroke [facility])    TIA    Discharge Diagnoses:   Active Problems:   Failed total hip arthroplasty, subsequent encounter  Estimated body mass index is 28.35 kg/m as calculated from the following:   Height as of this encounter: 6' (1.829 m).   Weight as of this encounter: 94.8 kg (209 lb).  Procedure(s) (LRB): RIGHT TOTAL HIP REVISION (Right)   Consults: None  HPI: [name] Hall is a 71 year old male, who had a right total hip arthroplasty done in [place] close to 10 years ago.  He has had progressively worsening weightbearing pain.  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  He had a metal-on-metal bearing surface.  He presents now for total hip arthroplasty revision.  Laboratory Data: Admission on 01/27/2017  Component Date Value Ref Range Status  . Glucose-Capillary 01/27/2017 93  65 - 99 mg/dL Final  . Comment 1 01/27/2017 Notify RN   Final  . Glucose-Capillary 01/27/2017 136* 65 - 99 mg/dL Final  . Prothrombin Time 01/27/2017 14.5  11.4 - 15.2 seconds Final  . INR 01/27/2017 1.12   Final  . Glucose-Capillary 01/27/2017 123* 65 - 99 mg/dL Final  . WBC 01/28/2017 8.4  4.0 - 10.5 K/uL Final  . RBC 01/28/2017 3.78* 4.22 - 5.81 MIL/uL Final  . Hemoglobin 01/28/2017 11.0* 13.0 - 17.0 g/dL Final  . HCT 01/28/2017 33.9* 39.0 -  52.0 % Final  . MCV 01/28/2017 89.7  78.0 - 100.0 fL Final  . MCH 01/28/2017 29.1  26.0 - 34.0 pg Final  . MCHC 01/28/2017 32.4  30.0 - 36.0 g/dL Final  . RDW 01/28/2017 13.1  11.5 - 15.5 % Final  . Platelets 01/28/2017 128* 150 - 400 K/uL Final  . Sodium 01/28/2017 136  135 - 145 mmol/L Final  . Potassium 01/28/2017 4.7  3.5 - 5.1 mmol/L Final  . Chloride 01/28/2017 107  101 - 111 mmol/L Final  . CO2 01/28/2017 25  22 - 32 mmol/L Final  . Glucose, Bld 01/28/2017 121* 65 - 99 mg/dL Final  . BUN 01/28/2017 16  6 - 20 mg/dL Final  . Creatinine, Ser 01/28/2017 0.85  0.61 - 1.24 mg/dL Final  . Calcium 01/28/2017 8.9  8.9 - 10.3 mg/dL Final  . GFR calc non Af Amer 01/28/2017 >60  >60 mL/min Final  . GFR calc Af Amer 01/28/2017 >60  >60 mL/min Final   Comment: (NOTE) The eGFR has been calculated using the CKD EPI equation. This calculation has not been validated in all clinical situations. eGFR's persistently <60 mL/min signify possible Chronic Kidney Disease.   . Anion gap 01/28/2017 4* 5 - 15 Final  . Prothrombin Time 01/28/2017 14.8  11.4 - 15.2 seconds Final  . INR 01/28/2017 1.16   Final  . Glucose-Capillary 01/27/2017 181* 65 - 99  mg/dL Final  . Glucose-Capillary 01/27/2017 177* 65 - 99 mg/dL Final  . Glucose-Capillary 01/28/2017 106* 65 - 99 mg/dL Final  . Glucose-Capillary 01/28/2017 118* 65 - 99 mg/dL Final  . WBC 01/29/2017 8.8  4.0 - 10.5 K/uL Final  . RBC 01/29/2017 3.82* 4.22 - 5.81 MIL/uL Final  . Hemoglobin 01/29/2017 11.2* 13.0 - 17.0 g/dL Final  . HCT 01/29/2017 34.2* 39.0 - 52.0 % Final  . MCV 01/29/2017 89.5  78.0 - 100.0 fL Final  . MCH 01/29/2017 29.3  26.0 - 34.0 pg Final  . MCHC 01/29/2017 32.7  30.0 - 36.0 g/dL Final  . RDW 01/29/2017 13.6  11.5 - 15.5 % Final  . Platelets 01/29/2017 131* 150 - 400 K/uL Final  . Sodium 01/29/2017 137  135 - 145 mmol/L Final  . Potassium 01/29/2017 4.8  3.5 - 5.1 mmol/L Final  . Chloride 01/29/2017 105  101 - 111 mmol/L Final    . CO2 01/29/2017 29  22 - 32 mmol/L Final  . Glucose, Bld 01/29/2017 123* 65 - 99 mg/dL Final  . BUN 01/29/2017 17  6 - 20 mg/dL Final  . Creatinine, Ser 01/29/2017 0.89  0.61 - 1.24 mg/dL Final  . Calcium 01/29/2017 9.0  8.9 - 10.3 mg/dL Final  . GFR calc non Af Amer 01/29/2017 >60  >60 mL/min Final  . GFR calc Af Amer 01/29/2017 >60  >60 mL/min Final   Comment: (NOTE) The eGFR has been calculated using the CKD EPI equation. This calculation has not been validated in all clinical situations. eGFR's persistently <60 mL/min signify possible Chronic Kidney Disease.   . Anion gap 01/29/2017 3* 5 - 15 Final  . Prothrombin Time 01/29/2017 15.2  11.4 - 15.2 seconds Final  . INR 01/29/2017 1.20   Final  . Glucose-Capillary 01/28/2017 136* 65 - 99 mg/dL Final  . Glucose-Capillary 01/28/2017 194* 65 - 99 mg/dL Final  . Glucose-Capillary 01/29/2017 122* 65 - 99 mg/dL Final  Hospital Outpatient Visit on 01/20/2017  Component Date Value Ref Range Status  . Glucose-Capillary 01/20/2017 123* 65 - 99 mg/dL Final  . Hgb A1c MFr Bld 01/20/2017 5.6  4.8 - 5.6 % Final   Comment: (NOTE)         Pre-diabetes: 5.7 - 6.4         Diabetes: >6.4         Glycemic control for adults with diabetes: <7.0   . Mean Plasma Glucose 01/20/2017 114   Final   Comment: (NOTE) Performed At: [facility] [place], [place] [id number] Lindon Romp MD RX:[id number]   . aPTT 01/20/2017 39* 24 - 36 seconds Final   Comment:        IF BASELINE aPTT IS ELEVATED, SUGGEST PATIENT RISK ASSESSMENT BE USED TO DETERMINE APPROPRIATE ANTICOAGULANT THERAPY.   . WBC 01/20/2017 5.7  4.0 - 10.5 K/uL Final  . RBC 01/20/2017 4.68  4.22 - 5.81 MIL/uL Final  . Hemoglobin 01/20/2017 13.9  13.0 - 17.0 g/dL Final  . HCT 01/20/2017 42.6  39.0 - 52.0 % Final  . MCV 01/20/2017 91.0  78.0 - 100.0 fL Final  . MCH 01/20/2017 29.7  26.0 - 34.0 pg Final  . MCHC 01/20/2017 32.6  30.0 - 36.0 g/dL Final  . RDW  01/20/2017 14.0  11.5 - 15.5 % Final  . Platelets 01/20/2017 134* 150 - 400 K/uL Final  . Sodium 01/20/2017 140  135 - 145 mmol/L Final  . Potassium 01/20/2017 5.1  3.5 - 5.1 mmol/L Final  . Chloride 01/20/2017 111  101 - 111 mmol/L Final  . CO2 01/20/2017 25  22 - 32 mmol/L Final  . Glucose, Bld 01/20/2017 112* 65 - 99 mg/dL Final  . BUN 01/20/2017 16  6 - 20 mg/dL Final  . Creatinine, Ser 01/20/2017 1.10  0.61 - 1.24 mg/dL Final  . Calcium 01/20/2017 9.5  8.9 - 10.3 mg/dL Final  . Total Protein 01/20/2017 7.2  6.5 - 8.1 g/dL Final  . Albumin 01/20/2017 4.1  3.5 - 5.0 g/dL Final  . AST 01/20/2017 18  15 - 41 U/L Final  . ALT 01/20/2017 19  17 - 63 U/L Final  . Alkaline Phosphatase 01/20/2017 95  38 - 126 U/L Final  . Total Bilirubin 01/20/2017 1.0  0.3 - 1.2 mg/dL Final  . GFR calc non Af Amer 01/20/2017 >60  >60 mL/min Final  . GFR calc Af Amer 01/20/2017 >60  >60 mL/min Final   Comment: (NOTE) The eGFR has been calculated using the CKD EPI equation. This calculation has not been validated in all clinical situations. eGFR's persistently <60 mL/min signify possible Chronic Kidney Disease.   . Anion gap 01/20/2017 4* 5 - 15 Final  . Prothrombin Time 01/20/2017 24.1* 11.4 - 15.2 seconds Final  . INR 01/20/2017 2.13   Final  . ABO/RH(D) 01/20/2017 O POS   Final  . Antibody Screen 01/20/2017 NEG   Final  . Sample Expiration 01/20/2017 01/30/2017   Final  . Extend sample reason 01/20/2017 NO TRANSFUSIONS OR PREGNANCY IN THE PAST 3 MONTHS   Final  . MRSA, PCR 01/20/2017 NEGATIVE  NEGATIVE Final  . Staphylococcus aureus 01/20/2017 POSITIVE* NEGATIVE Final   Comment:        The Xpert SA Assay (FDA approved for NASAL specimens in patients over 68 years of age), is one component of a comprehensive surveillance program.  Test performance has been validated by [facility] for patients greater than or equal to 61 year old. It is not intended to diagnose infection nor to guide or  monitor treatment.   . ABO/RH(D) 01/20/2017 O POS   Final     X-Rays:Dg Pelvis Portable  Result Date: 01/27/2017 CLINICAL DATA:  Status post right total hip revision. History of prostate carcinoma EXAM: PORTABLE PELVIS 1-2 VIEWS COMPARISON:  None. FINDINGS: Frontal view showing lower pelvis and both hips submitted. There are total hip replacements bilaterally. Prosthetic components appear well seated bilaterally. No evident fracture or dislocation. A postoperative drain is noted on the right. IMPRESSION: Total hip replacements bilaterally with prosthetic components well-seated. Drain on the right present. No acute fracture or dislocation. Electronically Signed   By: Lowella Grip III M.D.   On: 01/27/2017 09:26   Dg Femur [place], [place] 2 Views Right  Result Date: 01/27/2017 CLINICAL DATA:  Hip replacement. EXAM: RIGHT FEMUR PORTABLE 2 VIEW COMPARISON:  No recent prior . FINDINGS: Femoral prosthesis intact and in good anatomic alignment. No acute bony abnormality identified. Upper and lower femur incompletely imaged. Peripheral vascular calcification . IMPRESSION: 1.  No acute abnormality identified. 2.  Peripheral vascular disease . Electronically Signed   By: Marcello Moores  Register   On: 01/27/2017 08:31    EKG: Orders placed or performed during the hospital encounter of 08/31/14  . EKG 12-Lead  . EKG 12-Lead  . EKG     Hospital Course: Patient was admitted to [facility] and taken to the OR and underwent the above state procedure without complications.  Patient tolerated the procedure well and was later transferred to the recovery room and then to the orthopaedic floor for postoperative care.  They were given PO and IV analgesics for pain control following their surgery.  They were given 24 hours of postoperative antibiotics of  Anti-infectives    Start     Dose/Rate Route Frequency Ordered Stop   01/27/17 1300  ceFAZolin (ANCEF) IVPB 2g/100 mL premix     2 g 200 mL/hr over 30 Minutes  Intravenous Every 6 hours 01/27/17 1017 01/27/17 2009   01/27/17 0527  ceFAZolin (ANCEF) IVPB 2g/100 mL premix     2 g 200 mL/hr over 30 Minutes Intravenous On call to O.R. 01/27/17 3825 01/27/17 0539     and started on DVT prophylaxis in the form of Lovenox and Coumadin.   PT and OT were ordered for total hip protocol.  The patient was allowed to be WBAT with therapy. Discharge planning was consulted to help with postop disposition and equipment needs.  Patient had a good night on the evening of surgery and walked 20 feet.  They started to get up OOB with therapy on day one.  Hemovac drain was pulled without difficulty.  The knee immobilizer was removed and discontinued.  Continued to work with therapy into day two.  Dressing was changed on day two and the incision was healing well.   Patient was seen in rounds on POD 2 and was ready to go home.  No bending hip over 90 degrees- A "L" Angle Do not cross legs Do not let foot roll inward When turning these patients a pillow should be placed between the patient's legs to prevent crossing. Patients should have the affected knee fully extended when trying to sit or stand from all surfaces to prevent excessive hip flexion. When ambulating and turning toward the affected side the affected leg should have the toes turned out prior to moving the walker and the rest of patient's body as to prevent internal rotation/ turning in of the leg. Abduction pillows are the most effective way to prevent a patient from not crossing legs or turning toes in at rest. If an abduction pillow is not ordered placing a regular pillow length wise between the patient's legs is also an effective reminder. It is imperative that these precautions be maintained so that the surgical hip does not dislocate.  Discharge home with home health PT and home health RN for Protime draws Diet - Cardiac diet and Diabetic diet Follow up - in 2 weeks Activity - WBAT, Posterior Hip  Precautions. Disposition - Home Condition Upon Discharge - Good D/C Meds - See DC Summary DVT Prophylaxis - Lovenox and Coumadin, will give four days of Lovenox Sub-Q for bridging coverage. INR is 1.2 HGB 11.  Discharge Instructions    Call MD / Call 911    Complete by:  As directed    If you experience chest pain or shortness of breath, CALL 911 and be transported to the hospital emergency room.  If you develope a fever above 101 F, pus (white drainage) or increased drainage or redness at the wound, or calf pain, call your surgeon's office.   Change dressing    Complete by:  As directed    You may change your dressing dressing daily with sterile 4 x 4 inch gauze dressing and paper tape.  Do not submerge the incision under water.   Constipation Prevention    Complete by:  As directed  Drink plenty of fluids.  Prune juice may be helpful.  You may use a stool softener, such as Colace (over the counter) 100 mg twice a day.  Use MiraLax (over the counter) for constipation as needed.   Diet - low sodium heart healthy    Complete by:  As directed    Diet Carb Modified    Complete by:  As directed    Discharge instructions    Complete by:  As directed    Pick up stool softner and laxative for home use following surgery while on pain medications. Do not submerge incision under water. Please use good hand washing techniques while changing dressing each day. May shower starting three days after surgery. Please use a clean towel to pat the incision dry following showers. Continue to use ice for pain and swelling after surgery. Do not use any lotions or creams on the incision until instructed by your surgeon.  Wear both TED hose on both legs during the day every day for three weeks, but may have off at night at home.  Postoperative Constipation Protocol  Constipation - defined medically as fewer than three stools per week and severe constipation as less than one stool per week.  One of  the most common issues patients have following surgery is constipation.  Even if you have a regular bowel pattern at home, your normal regimen is likely to be disrupted due to multiple reasons following surgery.  Combination of anesthesia, postoperative narcotics, change in appetite and fluid intake all can affect your bowels.  In order to avoid complications following surgery, here are some recommendations in order to help you during your recovery period.  Colace (docusate) - Pick up an over-the-counter form of Colace or another stool softener and take twice a day as long as you are requiring postoperative pain medications.  Take with a full glass of water daily.  If you experience loose stools or diarrhea, hold the colace until you stool forms back up.  If your symptoms do not get better within 1 week or if they get worse, check with your doctor.  Dulcolax (bisacodyl) - Pick up over-the-counter and take as directed by the product packaging as needed to assist with the movement of your bowels.  Take with a full glass of water.  Use this product as needed if not relieved by Colace only.   MiraLax (polyethylene glycol) - Pick up over-the-counter to have on hand.  MiraLax is a solution that will increase the amount of water in your bowels to assist with bowel movements.  Take as directed and can mix with a glass of water, juice, soda, coffee, or tea.  Take if you go more than two days without a movement. Do not use MiraLax more than once per day. Call your doctor if you are still constipated or irregular after using this medication for 7 days in a row.  If you continue to have problems with postoperative constipation, please contact the office for further assistance and recommendations.  If you experience "the worst abdominal pain ever" or develop nausea or vomiting, please contact the office immediatly for further recommendations for treatment.   Please follow the INR and titrate Coumadin dose for a  therapeutic range between 2.0 and 3.0 INR.    Continue Lovenox injections until the INR is therapeutic at or greater than 2.0.  When INR reaches the therapeutic level of equal to or greater than 2.0, the patient may discontinue the Lovenox injections.  Given  RX for four more syringes for home use.   Do not sit on low chairs, stoools or toilet seats, as it may be difficult to get up from low surfaces    Complete by:  As directed    Driving restrictions    Complete by:  As directed    No driving until released by the physician.   Follow the hip precautions as taught in Physical Therapy    Complete by:  As directed    Increase activity slowly as tolerated    Complete by:  As directed    Lifting restrictions    Complete by:  As directed    No lifting until released by the physician.   Patient may shower    Complete by:  As directed    You may shower without a dressing once there is no drainage.  Do not wash over the wound.  If drainage remains, do not shower until drainage stops.   TED hose    Complete by:  As directed    Use stockings (TED hose) for 3 weeks on both leg(s).  You may remove them at night for sleeping.   Weight bearing as tolerated    Complete by:  As directed    Laterality:  right   Extremity:  Lower     Allergies as of 01/29/2017      Reactions   Altace [ramipril] Hives   Sulfonamide Derivatives Hives, Itching   Oxycodone Itching, Rash      Medication List    STOP taking these medications   oxyCODONE-acetaminophen 5-325 MG tablet Commonly known as:  PERCOCET/ROXICET     TAKE these medications   acetaminophen 500 MG tablet Commonly known as:  TYLENOL Take 1,000 mg by mouth every 6 (six) hours as needed for mild pain or moderate pain.   atorvastatin 80 MG tablet Commonly known as:  LIPITOR Take 80 mg by mouth at bedtime.   enoxaparin 40 MG/0.4ML injection Commonly known as:  LOVENOX Inject 0.4 mLs (40 mg total) into the skin daily. Take Lovenox injection  daily in the morning for four days for bridge coverage until warfarin level back up What changed:  medication strength  how much to take  when to take this  additional instructions   HYDROmorphone 2 MG tablet Commonly known as:  DILAUDID Take 1-2 tablets (2-4 mg total) by mouth every 4 (four) hours as needed for moderate pain or severe pain.   lithium carbonate 300 MG capsule Take 300-600 mg by mouth See admin instructions. 300 mg in the morning and 600 mg in the evening   metFORMIN 500 MG tablet Commonly known as:  GLUCOPHAGE Take 500 mg by mouth daily.   methocarbamol 500 MG tablet Commonly known as:  ROBAXIN Take 1 tablet (500 mg total) by mouth every 6 (six) hours as needed for muscle spasms.   prednisoLONE acetate 1 % ophthalmic suspension Commonly known as:  PRED FORTE Place 2 drops into the left eye 2 (two) times daily.   traMADol 50 MG tablet Commonly known as:  ULTRAM Take 1-2 tablets (50-100 mg total) by mouth every 6 (six) hours as needed for moderate pain.   traZODone 100 MG tablet Commonly known as:  DESYREL Take 100 mg by mouth at bedtime.   triamcinolone cream 0.1 % Commonly known as:  KENALOG Apply 1 application topically 2 (two) times daily.   warfarin 5 MG tablet Commonly known as:  COUMADIN Take 5-7.5 mg by mouth See admin instructions.  Takes 1 tablet (5 mg) on Monday, Wednesday and Fridays  1.5 tablet (7.5 mg) all other days of the week   warfarin 7.5 MG tablet Commonly known as:  COUMADIN Take 1 tablet (7.5 mg total) by mouth one time only at 6 PM.      Follow-up Information    [facility] Follow up.   Specialty:  [place] Why:  home health agency for physical and coumadin dosing (INR sticks) Contact information: [address] [place] [address] [phone]        Gearlean Alf, MD. Schedule an appointment as soon as possible for a visit on 02/09/2017.   Specialty:  Orthopedic Surgery Why:  Call  office to setup appointment on Tuesday 02/09/2017 with Dr. Wynelle Link. Contact information: [address] [place] [address] [phone]           Signed: Arlee Muslim, PA-C Orthopaedic Surgery 01/29/2017, 8:21 AM

## 2017-01-29 NOTE — Progress Notes (Signed)
Physical Therapy Treatment Patient Details Name: [name] Hall MRN: [id number] DOB: 13-May-1946 Today's Date: 01/29/2017    History of Present Illness s/p R posterior THA revision    PT Comments    Pt progressing well with mobility and eager for return home.  Reviewed stairs, car transfers and home therex.     Follow Up Recommendations  Home health PT     Equipment Recommendations  None recommended by PT    Recommendations for Other Services       Precautions / Restrictions Precautions Precautions: Fall;Posterior Hip Precaution Booklet Issued: Yes (comment) Precaution Comments: Pt recalls 2/3 THP without cues - all THP reviewed Restrictions Weight Bearing Restrictions: No Other Position/Activity Restrictions: WBAT    Mobility  Bed Mobility Overal bed mobility: Needs Assistance Bed Mobility: Supine to Sit     Supine to sit: Supervision     General bed mobility comments: cues for sequence and use of L LE to self assist  Transfers Overall transfer level: Needs assistance Equipment used: Rolling walker (2 wheeled) Transfers: Sit to/from Stand Sit to Stand: Supervision         General transfer comment: cues for LE management and use of UEs to self assist  Ambulation/Gait Ambulation/Gait assistance: Min guard;Supervision Ambulation Distance (Feet): 200 Feet Assistive device: Rolling walker (2 wheeled) Gait Pattern/deviations: Step-to pattern;Step-through pattern;Shuffle;Trunk flexed Gait velocity: decreased Gait velocity interpretation: Below normal speed for age/gender General Gait Details: cues for sequence, ER on R, posture and position from RW   Stairs Stairs: Yes   Stair Management: Two rails;Step to pattern;Forwards Number of Stairs: 5 General stair comments: cues for sequence and foot placement  Wheelchair Mobility    Modified Rankin (Stroke Patients Only)       Balance                                    Cognition  Arousal/Alertness: Awake/alert Behavior During Therapy: WFL for tasks assessed/performed Overall Cognitive Status: Within Functional Limits for tasks assessed                      Exercises Total Joint Exercises Ankle Circles/Pumps: AROM;Both;15 reps;Supine Quad Sets: 10 reps;Both;AROM Heel Slides: AAROM;Right;20 reps;Supine Hip ABduction/ADduction: AAROM;Right;15 reps;Supine Long Arc Quad: AROM;20 reps;Seated;Right    General Comments        Pertinent Vitals/Pain Pain Assessment: 0-10 Pain Score: 2  Pain Location: R hip Pain Descriptors / Indicators: Aching Pain Intervention(s): Limited activity within patient's tolerance;Monitored during session;Premedicated before session;Ice applied    Home Living                      Prior Function            PT Goals (current goals can now be found in the care plan section) Acute Rehab PT Goals Patient Stated Goal: less pain with walking PT Goal Formulation: With patient Time For Goal Achievement: 02/03/17 Potential to Achieve Goals: Good Progress towards PT goals: Progressing toward goals    Frequency    7X/week      PT Plan Current plan remains appropriate    Co-evaluation             End of Session Equipment Utilized During Treatment: Gait belt Activity Tolerance: Patient tolerated treatment well Patient left: in chair;with call bell/phone within reach;with chair alarm set Nurse Communication: Mobility status PT Visit Diagnosis: Other abnormalities of  gait and mobility (R26.89)     Time: [id number] PT Time Calculation (min) (ACUTE ONLY): 48 min  Charges:  $Gait Training: 8-22 mins $Therapeutic Exercise: 8-22 mins $Therapeutic Activity: 8-22 mins                    G Codes:       , 01-31-17, 11:55 AM

## 2017-01-29 NOTE — Progress Notes (Signed)
   Subjective: 2 Days Post-Op Procedure(s) (LRB): RIGHT TOTAL HIP REVISION (Right) Patient reports pain as mild.   Patient seen in rounds with Dr. Wynelle Link. Patient is well, but has had some minor complaints of pain in the hip, requiring pain medications Patient is ready to go home later today after therapy.  Objective: Vital signs in last 24 hours: Temp:  [98.2 F (36.8 C)-99.4 F (37.4 C)] 98.2 F (36.8 C) (03/02 0548) Pulse Rate:  [62-78] 62 (03/02 0548) Resp:  [15-16] 16 (03/02 0548) BP: (111-127)/(58-72) 112/66 (03/02 0548) SpO2:  [96 %-98 %] 98 % (03/02 0548)  Intake/Output from previous day:  Intake/Output Summary (Last 24 hours) at 01/29/17 0806 Last data filed at 01/29/17 0600  Gross per 24 hour  Intake             1400 ml  Output             2900 ml  Net            -1500 ml    Intake/Output this shift: No intake/output data recorded.  Labs:  Recent Labs  01/28/17 0410 01/29/17 0513  HGB 11.0* 11.2*    Recent Labs  01/28/17 0410 01/29/17 0513  WBC 8.4 8.8  RBC 3.78* 3.82*  HCT 33.9* 34.2*  PLT 128* 131*    Recent Labs  01/28/17 0410 01/29/17 0513  NA 136 137  K 4.7 4.8  CL 107 105  CO2 25 29  BUN 16 17  CREATININE 0.85 0.89  GLUCOSE 121* 123*  CALCIUM 8.9 9.0    Recent Labs  01/28/17 0410 01/29/17 0513  INR 1.16 1.20    EXAM: General - Patient is Alert, Appropriate and Oriented Extremity - Neurovascular intact Sensation intact distally Intact pulses distally Incision - clean, dry, no drainage Motor Function - intact, moving foot and toes well on exam.   Assessment/Plan: 2 Days Post-Op Procedure(s) (LRB): RIGHT TOTAL HIP REVISION (Right) Procedure(s) (LRB): RIGHT TOTAL HIP REVISION (Right) Past Medical History:  Diagnosis Date  . Diabetes mellitus    type 2  . DVT (deep venous thrombosis) ([place])   . Hyperlipidemia   . Hypertension   . Orthostatic hypotension   . Osteoarthritis   . PE (pulmonary embolism)   . PERSONAL  HISTORY, VENOUS THROMBOSIS AND EMBOLISM   . Prostate CA [facility])    treated with radiation  . PROSTATE CANCER   . Stroke [facility])    TIA    Active Problems:   Failed total hip arthroplasty, subsequent encounter  Estimated body mass index is 28.35 kg/m as calculated from the following:   Height as of this encounter: 6' (1.829 m).   Weight as of this encounter: 94.8 kg (209 lb). Up with therapy Discharge home with home health PT and home health RN for Protime draws Diet - Cardiac diet and Diabetic diet Follow up - in 2 weeks Activity - WBAT, Posterior Hip Precautions. Disposition - Home Condition Upon Discharge - Good D/C Meds - See DC Summary DVT Prophylaxis - Lovenox and Coumadin, will give four days of Lovenox Sub-Q for bridging coverage. INR is 1.2 HGB 11.2  Arlee Muslim, PA-C Orthopaedic Surgery 01/29/2017, 8:06 AM

## 2017-05-03 NOTE — Addendum Note (Signed)
Addendum  created 05/03/17 1143 by Myrtie Soman, MD   Sign clinical note

## 2017-05-26 ENCOUNTER — Encounter: Payer: Self-pay | Admitting: Podiatry

## 2017-05-26 ENCOUNTER — Ambulatory Visit (INDEPENDENT_AMBULATORY_CARE_PROVIDER_SITE_OTHER): Payer: Medicare Other | Admitting: Podiatry

## 2017-05-26 ENCOUNTER — Ambulatory Visit (INDEPENDENT_AMBULATORY_CARE_PROVIDER_SITE_OTHER): Payer: Medicare Other

## 2017-05-26 VITALS — BP 91/56 | HR 74

## 2017-05-26 DIAGNOSIS — E1142 Type 2 diabetes mellitus with diabetic polyneuropathy: Secondary | ICD-10-CM

## 2017-05-26 DIAGNOSIS — R52 Pain, unspecified: Secondary | ICD-10-CM | POA: Diagnosis not present

## 2017-05-26 DIAGNOSIS — M129 Arthropathy, unspecified: Secondary | ICD-10-CM | POA: Diagnosis not present

## 2017-05-26 MED ORDER — MELOXICAM 15 MG PO TABS: 15.0000 mg | ORAL_TABLET | Freq: Every day | ORAL | 0 refills | Status: DC

## 2017-05-26 NOTE — Progress Notes (Signed)
Subjective:    Patient ID: [name] Hall, male    DOB: February 19, 1946, 70 y.o.   MRN: [id number]  HPI this patient presents to the office today stating that he is experiencing numbness and even poor circulation in his left foot. He says he is experiencing more swelling in his left foot than his right foot. He does give a history of having been exposed to agent orange years ago.  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  He denies any history of foot or ankle trauma aside from the sprain. This patient does have a history of type 2 diabetes for which he takes metformin.  He also says he is concerned that his left leg is extremely brownish and discolored compared to his right leg.  He relates that he is experiencing pain in his left foot, which is present through his whole foot.  He presents the office today for an evaluation and treatment of his feet    Review of Systems  Constitutional: Positive for chills and fatigue.  HENT: Positive for sneezing.   Respiratory: Positive for shortness of breath.   Cardiovascular: Positive for leg swelling.  Endocrine: Positive for cold intolerance.       Increased urination  Genitourinary: Positive for frequency.  Musculoskeletal: Positive for back pain.       Rash, joint pain  Neurological: Positive for numbness.  Hematological: Bruises/bleeds easily.  All other systems reviewed and are negative.      Objective:   Physical Exam GENERAL APPEARANCE: Alert, conversant. Appropriately groomed. No acute distress.  VASCULAR: Pedal pulses are  palpable at  [facility] and PT bilateral.  Capillary refill time is immediate to all digits,  Normal temperature gradient.  Digital hair growth is present bilateral .  Severe hemosiderin deposits left leg vs.  Right leg.  .  Swelling left forefoot.   NEUROLOGIC:   in right foot is diminished.  LOPS left foot is absent. Muscle strength normal.  MUSCULOSKELETAL:  acceptable muscle strength, tone and stability bilateral.  Intrinsic muscluature intact bilateral.  Rectus appearance of foot and digits noted bilateral.  Severe DJD 1st MPJ  Right foot.  Examination  Left foot reveals no STJ motion with normal  ROM  STJ right foot. Palpable pain TNJ left foot.  DERMATOLOGIC: skin color, texture, and turgor are within normal limits.  No preulcerative lesions or ulcers  are seen, no interdigital maceration noted.  No open lesions present.  Digital nails are asymptomatic. No drainage noted.         Assessment & Plan:  Traumatic arthritis left rearfoot.  Left foot neuropathy.    IE  Xrays left foot reveal severe arthritic changes and dorsal bone formation at the talonavicular joint, right foot.  This arthritic change limits the subtalar joint range of motion and prevents him from properly distributing weight during gait.  This allows  pain to radiate through his whole left foot.  There is also prominent pain at the talonavicular joint. due to the excessive bone formation.   He also has a previous history of venous thrombosis an MRI which she had surgically repaired by his neighbor a Hydrographic surveyor. This explains the excessive hemosiderin deposits in his lower left leg.  We discussed his pathology and discussed conservative versus surgical intervention.  We decided to treat him with an anti-inflammatory medication for 2 weeks.  He then will return and we will reevaluate his foot  and then  consider injection therapy to help to relieve his discomfort.  Patient to return to the office in 2 weeks for further evaluation and treatment   Gardiner Barefoot DPM

## 2017-07-28 ENCOUNTER — Encounter: Payer: Self-pay | Admitting: Podiatry

## 2017-07-28 ENCOUNTER — Ambulatory Visit (INDEPENDENT_AMBULATORY_CARE_PROVIDER_SITE_OTHER): Payer: Medicare Other | Admitting: Podiatry

## 2017-07-28 DIAGNOSIS — M2140 Flat foot [pes planus] (acquired), unspecified foot: Secondary | ICD-10-CM

## 2017-07-28 DIAGNOSIS — E1142 Type 2 diabetes mellitus with diabetic polyneuropathy: Secondary | ICD-10-CM

## 2017-07-28 DIAGNOSIS — M129 Arthropathy, unspecified: Secondary | ICD-10-CM

## 2017-07-28 DIAGNOSIS — M76829 Posterior tibial tendinitis, unspecified leg: Secondary | ICD-10-CM

## 2017-07-28 DIAGNOSIS — R52 Pain, unspecified: Secondary | ICD-10-CM

## 2017-07-28 NOTE — Progress Notes (Signed)
This patient returns to the office follow-up for diagnosis of traumatic arthritis in his left rear foot.  He was also diagnosed with neuropathy left rear foot.  Patient returns the office today for recheck from his initial visit in June.  He was initially told to return to the office in 2 weeks for further evaluation and treatment, but had scheduling problems and has only nail been able to return to the office for further evaluation of his left foot.  Following his initial visit, we chose to start him on anti-inflammatory medication for 2 weeks.  He has taken the Mobic longer than the 2 weeks and has said there has been no improvement.  He now returns to the office today stating he has pain noted along the course of the tendon behind his medial anklebone.  He also has pain noted to the medial anklebone with evidence of healing excoriations noted to the left ankle.  He returns to the office for an evaluation and treatment of his left ankle pain.     GENERAL APPEARANCE: Alert, conversant. Appropriately groomed. No acute distress.  VASCULAR: Pedal pulses are  palpable at  [facility] and PT bilateral.  Capillary refill time is immediate to all digits,  Normal temperature gradient.  Hemosiderin deposits noted  B/L. NEUROLOGIC: LOPD right foot is diminished.  LOPS left foot is absent.  , Muscle strength normal.  MUSCULOSKELETAL: acceptable muscle strength, tone and stability bilateral.  Intrinsic muscluature intact bilateral.  Rectus appearance of foot and digits noted bilateral. Severe DJD first MPJ right foot.  Left foot reveals no subtalar joint motion with normal range of motion. Subtalar joint of the right foot.  Pain to the medial aspect of the left rear foot.  Palpable pain noted to the medial malleolus left ankle.     DERMATOLOGIC: skin color, texture, and turgor are within normal limits.  No preulcerative lesions or ulcers  are seen, no interdigital maceration noted.  No open lesions present.  Digital nails are  asymptomatic. No drainage noted. There is a skin discoloration noted over the medial malleolus left ankle, which I believe is healing from self excoriations.    Diagnosis traumatic arthritis left rear foot.  Posterior tibial tendon dysfunction, left foot.     Return office visit.  Injection therapy along the course of the posterior tibial tendon behind the medial malleolus.  Power step insoles were prescribed and dispensed.  Patient to return to the office in 2 weeks for further evaluation and treatment.  I informed this patient that if there is no improvement, we should consider sending him to the new or doctors in the practice.   Gardiner Barefoot DPM

## 2017-08-11 ENCOUNTER — Ambulatory Visit (INDEPENDENT_AMBULATORY_CARE_PROVIDER_SITE_OTHER): Payer: Medicare Other | Admitting: Podiatry

## 2017-08-11 ENCOUNTER — Encounter: Payer: Self-pay | Admitting: Podiatry

## 2017-08-11 DIAGNOSIS — M129 Arthropathy, unspecified: Secondary | ICD-10-CM | POA: Diagnosis not present

## 2017-08-11 DIAGNOSIS — E1142 Type 2 diabetes mellitus with diabetic polyneuropathy: Secondary | ICD-10-CM | POA: Diagnosis not present

## 2017-08-11 NOTE — Progress Notes (Signed)
This patient returns to the office follow-up for diagnosis of traumatic arthritis in his left rear foot.  He was also diagnosed with neuropathy left rear foot.  Patient returns the office today for continued evaluation of his left rear foot arthritis and posterior tibial tendon dysfunction.  He was treated with power step insoles and an injection therapy along the left posterior tibial tendon.  He says his foot is better but it is still uncomfortable but the pain is now just uncomfortableness he presents the office today for continued evaluation and treatment of his left foot    GENERAL APPEARANCE: Alert, conversant. Appropriately groomed. No acute distress.  VASCULAR: Pedal pulses are  palpable at  [facility] and PT bilateral.  Capillary refill time is immediate to all digits,  Normal temperature gradient.  Hemosiderin deposits noted  B/L. NEUROLOGIC: LOPD right foot is diminished.  LOPS left foot is absent.  , Muscle strength normal.  MUSCULOSKELETAL: acceptable muscle strength, tone and stability bilateral.  Intrinsic muscluature intact bilateral.  Rectus appearance of foot and digits noted bilateral. Severe DJD first MPJ right foot.  Left foot reveals no subtalar joint motion with normal range of motion subtalar joint of the right foot.  Pain to the medial aspect of the left rear foot.  Palpable pain noted to the medial malleolus left ankle.     DERMATOLOGIC: skin color, texture, and turgor are within normal limits.  No preulcerative lesions or ulcers  are seen, no interdigital maceration noted.  No open lesions present.  Digital nails are asymptomatic. No drainage noted. There is a skin discoloration noted over the medial malleolus left ankle, which I believe is healing from self excoriations.    Diagnosis traumatic arthritis left rear foot.  Posterior tibial tendon dysfunction, left foot.     Return office visit.  Discussed this condition with this patient.  There appears to be only chronic discomfort  noted from his arthritis on his left foot and no evidence of posterior tibial tendinitis.  I told him to wear the power steps at home and when there is acute pain to return the office for continued evaluation and treatment  RTC prn.   Gardiner Barefoot DPM

## 2019-12-28 ENCOUNTER — Ambulatory Visit: Payer: Non-veteran care

## 2020-01-04 ENCOUNTER — Ambulatory Visit: Payer: Non-veteran care

## 2020-02-12 ENCOUNTER — Other Ambulatory Visit: Payer: Self-pay | Admitting: *Deleted

## 2020-02-12 DIAGNOSIS — I872 Venous insufficiency (chronic) (peripheral): Secondary | ICD-10-CM

## 2020-02-13 ENCOUNTER — Encounter: Payer: Non-veteran care | Admitting: Vascular Surgery

## 2020-02-13 ENCOUNTER — Encounter (HOSPITAL_COMMUNITY): Payer: Non-veteran care

## 2020-04-24 ENCOUNTER — Other Ambulatory Visit: Payer: Self-pay | Admitting: Urology

## 2020-07-09 ENCOUNTER — Other Ambulatory Visit: Payer: Self-pay

## 2020-07-09 ENCOUNTER — Encounter (HOSPITAL_BASED_OUTPATIENT_CLINIC_OR_DEPARTMENT_OTHER): Payer: Self-pay | Admitting: Urology

## 2020-07-09 NOTE — Progress Notes (Signed)
Left message with selita need instructions for stopping warfarin faxed to [phone]

## 2020-07-09 NOTE — Progress Notes (Addendum)
Spoke w/ via phone for pre-op interview---PT Lab needs dos----pt, I stat 8            COVID test ------07-15-20 at 100 pm Arrive at -------700 am 07-18-2020 NPO after MN NO Solid Food.  Clear liquids from MN until---600 am then npo Medications to take morning of surgery -----none Diabetic medication -----none day of surgery Patient Special Instructions -----hibiclens shower hs chin to toes Pre-Op special Istructions -----pt given overnight stay instructions Patient verbalized understanding of instructions that were given at this phone interview. Patient denies shortness of breath, chest pain, fever, cough at this phone interview.  Anesthesia Review: on warfarin   (hx of dvt and pe) taking lovenox bridge for 4 days before surgery  PCP: dr Lennette Bihari little, lov/medical clearance note 07-03-2020 on chart for 07-18-2020 surgery Cardiologist :none Chest x-ray :none EKG :none Echo :none Stress test:none Cardiac Cath : none Activity level: can climb stairs, occ sob with exertion, does own housework and yardwork Sleep Study/ CPAP :none Fasting Blood Sugar : 115-135     / Checks Blood Sugar -3 or 4 x week   Blood Thinner/ Instructions /Last Dose: stops warfarin 4 days before surgery and starts lovenox bridge per dr Lorenza Evangelist note 07-03-2020 on chart ASA / Instructions/ Last Dose :n/a

## 2020-07-10 ENCOUNTER — Encounter (HOSPITAL_BASED_OUTPATIENT_CLINIC_OR_DEPARTMENT_OTHER): Payer: Self-pay | Admitting: Urology

## 2020-07-15 ENCOUNTER — Other Ambulatory Visit (HOSPITAL_COMMUNITY)
Admission: RE | Admit: 2020-07-15 | Discharge: 2020-07-15 | Disposition: A | Discharge status: Home / Self Care | Payer: No Typology Code available for payment source | Source: Ambulatory Visit | Attending: Urology | Admitting: Urology

## 2020-07-15 DIAGNOSIS — Z20822 Contact with and (suspected) exposure to covid-19: Secondary | ICD-10-CM | POA: Insufficient documentation

## 2020-07-15 DIAGNOSIS — Z01812 Encounter for preprocedural laboratory examination: Secondary | ICD-10-CM | POA: Diagnosis present

## 2020-07-15 LAB — SARS CORONAVIRUS 2 (TAT 6-24 HRS): SARS Coronavirus 2: NEGATIVE

## 2020-07-17 ENCOUNTER — Other Ambulatory Visit: Payer: Self-pay | Admitting: Urology

## 2020-07-18 ENCOUNTER — Observation Stay (HOSPITAL_BASED_OUTPATIENT_CLINIC_OR_DEPARTMENT_OTHER)
Admission: RE | Admit: 2020-07-18 | Discharge: 2020-07-19 | Disposition: A | Discharge status: Home / Self Care | Payer: No Typology Code available for payment source | Attending: Urology | Admitting: Urology

## 2020-07-18 ENCOUNTER — Encounter (HOSPITAL_BASED_OUTPATIENT_CLINIC_OR_DEPARTMENT_OTHER)
Admission: RE | Disposition: A | Discharge status: Home / Self Care | Payer: Self-pay | Source: Home / Self Care | Attending: Urology

## 2020-07-18 ENCOUNTER — Encounter (HOSPITAL_BASED_OUTPATIENT_CLINIC_OR_DEPARTMENT_OTHER): Payer: Self-pay | Admitting: Urology

## 2020-07-18 ENCOUNTER — Ambulatory Visit (HOSPITAL_BASED_OUTPATIENT_CLINIC_OR_DEPARTMENT_OTHER): Payer: No Typology Code available for payment source | Admitting: Anesthesiology

## 2020-07-18 DIAGNOSIS — Z87891 Personal history of nicotine dependence: Secondary | ICD-10-CM | POA: Insufficient documentation

## 2020-07-18 DIAGNOSIS — E119 Type 2 diabetes mellitus without complications: Secondary | ICD-10-CM | POA: Insufficient documentation

## 2020-07-18 DIAGNOSIS — Z8546 Personal history of malignant neoplasm of prostate: Secondary | ICD-10-CM | POA: Diagnosis not present

## 2020-07-18 DIAGNOSIS — Z96643 Presence of artificial hip joint, bilateral: Secondary | ICD-10-CM | POA: Diagnosis not present

## 2020-07-18 DIAGNOSIS — I10 Essential (primary) hypertension: Secondary | ICD-10-CM | POA: Insufficient documentation

## 2020-07-18 DIAGNOSIS — N529 Male erectile dysfunction, unspecified: Secondary | ICD-10-CM | POA: Diagnosis not present

## 2020-07-18 DIAGNOSIS — Z86718 Personal history of other venous thrombosis and embolism: Secondary | ICD-10-CM | POA: Diagnosis not present

## 2020-07-18 HISTORY — PX: PENILE PROSTHESIS IMPLANT: SHX240

## 2020-07-18 HISTORY — DX: Dyspnea, unspecified: R06.00

## 2020-07-18 HISTORY — DX: Bipolar disorder, unspecified: F31.9

## 2020-07-18 HISTORY — DX: Activated protein C resistance: D68.51

## 2020-07-18 HISTORY — DX: Inflammatory liver disease, unspecified: K75.9

## 2020-07-18 HISTORY — DX: Male erectile dysfunction, unspecified: N52.9

## 2020-07-18 LAB — POCT I-STAT, CHEM 8
BUN: 18 mg/dL (ref 8–23)
Calcium, Ion: 1.29 mmol/L (ref 1.15–1.40)
Chloride: 108 mmol/L (ref 98–111)
Creatinine, Ser: 1 mg/dL (ref 0.61–1.24)
Glucose, Bld: 114 mg/dL — ABNORMAL HIGH (ref 70–99)
HCT: 45 % (ref 39.0–52.0)
Hemoglobin: 15.3 g/dL (ref 13.0–17.0)
Potassium: 4.2 mmol/L (ref 3.5–5.1)
Sodium: 142 mmol/L (ref 135–145)
TCO2: 22 mmol/L (ref 22–32)

## 2020-07-18 LAB — GLUCOSE, CAPILLARY
Glucose-Capillary: 126 mg/dL — ABNORMAL HIGH (ref 70–99)
Glucose-Capillary: 155 mg/dL — ABNORMAL HIGH (ref 70–99)

## 2020-07-18 LAB — PROTIME-INR
INR: 1 (ref 0.8–1.2)
Prothrombin Time: 13.1 seconds (ref 11.4–15.2)

## 2020-07-18 SURGERY — INSERTION, PENILE PROSTHESIS, INFLATABLE
Anesthesia: General | Site: Penis

## 2020-07-18 MED ORDER — METHOCARBAMOL 500 MG PO TABS
ORAL_TABLET | ORAL | Status: AC
  Filled 2020-07-18: qty 1

## 2020-07-18 MED ORDER — DIPHENHYDRAMINE HCL 50 MG/ML IJ SOLN: 12.5000 mg | Freq: Four times a day (QID) | INTRAMUSCULAR | Status: DC | PRN

## 2020-07-18 MED ORDER — KETOROLAC TROMETHAMINE 15 MG/ML IJ SOLN
15.0000 mg | Freq: Four times a day (QID) | INTRAMUSCULAR | Status: DC
  Administered 2020-07-18 – 2020-07-19 (×3): 15 mg via INTRAVENOUS

## 2020-07-18 MED ORDER — BACITRACIN-NEOMYCIN-POLYMYXIN 400-5-5000 EX OINT
1.0000 "application " | TOPICAL_OINTMENT | Freq: Three times a day (TID) | CUTANEOUS | Status: DC | PRN
  Administered 2020-07-18: 1 via TOPICAL

## 2020-07-18 MED ORDER — ONDANSETRON HCL 4 MG/2ML IJ SOLN
INTRAMUSCULAR | Status: AC
  Filled 2020-07-18: qty 2

## 2020-07-18 MED ORDER — METFORMIN HCL 500 MG PO TABS
500.0000 mg | ORAL_TABLET | Freq: Every day | ORAL | Status: DC
  Administered 2020-07-18: 500 mg via ORAL
  Filled 2020-07-18: qty 1

## 2020-07-18 MED ORDER — DIPHENHYDRAMINE HCL 12.5 MG/5ML PO ELIX: 12.5000 mg | ORAL_SOLUTION | Freq: Four times a day (QID) | ORAL | Status: DC | PRN

## 2020-07-18 MED ORDER — FENTANYL CITRATE (PF) 100 MCG/2ML IJ SOLN
INTRAMUSCULAR | Status: AC
Start: 2020-07-18 — End: ?
  Filled 2020-07-18: qty 2

## 2020-07-18 MED ORDER — DOCUSATE SODIUM 100 MG PO CAPS
ORAL_CAPSULE | ORAL | Status: AC
  Filled 2020-07-18: qty 1

## 2020-07-18 MED ORDER — GENTAMICIN SULFATE 40 MG/ML IJ SOLN
5.0000 mg/kg | INTRAVENOUS | Status: AC
  Administered 2020-07-18: 449.6 mg via INTRAVENOUS
  Filled 2020-07-18: qty 11.25

## 2020-07-18 MED ORDER — METHOCARBAMOL 500 MG PO TABS
500.0000 mg | ORAL_TABLET | Freq: Four times a day (QID) | ORAL | Status: DC | PRN
  Administered 2020-07-18 – 2020-07-19 (×3): 500 mg via ORAL

## 2020-07-18 MED ORDER — MORPHINE SULFATE (PF) 4 MG/ML IV SOLN: 2.0000 mg | INTRAVENOUS | Status: DC | PRN

## 2020-07-18 MED ORDER — LACTATED RINGERS IV SOLN: INTRAVENOUS | Status: DC

## 2020-07-18 MED ORDER — BACITRACIN-NEOMYCIN-POLYMYXIN OINTMENT TUBE
TOPICAL_OINTMENT | CUTANEOUS | Status: AC
  Filled 2020-07-18: qty 14.17

## 2020-07-18 MED ORDER — AMPHOTERICIN B 50 MG IV SOLR
50.0000 mg | INTRAVENOUS | Status: AC
  Administered 2020-07-18: 50 mg
  Filled 2020-07-18: qty 50

## 2020-07-18 MED ORDER — ATORVASTATIN CALCIUM 80 MG PO TABS
80.0000 mg | ORAL_TABLET | Freq: Every day | ORAL | Status: DC
  Administered 2020-07-18: 80 mg via ORAL
  Filled 2020-07-18: qty 1

## 2020-07-18 MED ORDER — HYDROCODONE-ACETAMINOPHEN 5-325 MG PO TABS
ORAL_TABLET | ORAL | Status: AC
  Filled 2020-07-18: qty 1

## 2020-07-18 MED ORDER — LITHIUM CARBONATE 300 MG PO CAPS: 300.0000 mg | ORAL_CAPSULE | ORAL | Status: DC

## 2020-07-18 MED ORDER — KETOROLAC TROMETHAMINE 15 MG/ML IJ SOLN
15.0000 mg | Freq: Four times a day (QID) | INTRAMUSCULAR | Status: DC
  Administered 2020-07-18: 15 mg via INTRAVENOUS

## 2020-07-18 MED ORDER — FENTANYL CITRATE (PF) 100 MCG/2ML IJ SOLN
INTRAMUSCULAR | Status: AC
  Filled 2020-07-18: qty 2

## 2020-07-18 MED ORDER — OXYCODONE HCL 5 MG/5ML PO SOLN: 5.0000 mg | Freq: Once | ORAL | Status: DC | PRN

## 2020-07-18 MED ORDER — TRAZODONE HCL 100 MG PO TABS
100.0000 mg | ORAL_TABLET | Freq: Every day | ORAL | Status: DC
  Administered 2020-07-18: 100 mg via ORAL
  Filled 2020-07-18: qty 1

## 2020-07-18 MED ORDER — DOCUSATE SODIUM 100 MG PO CAPS
100.0000 mg | ORAL_CAPSULE | Freq: Two times a day (BID) | ORAL | Status: DC
  Administered 2020-07-18 (×2): 100 mg via ORAL

## 2020-07-18 MED ORDER — FENTANYL CITRATE (PF) 100 MCG/2ML IJ SOLN
25.0000 ug | INTRAMUSCULAR | Status: DC | PRN
  Administered 2020-07-18: 25 ug via INTRAVENOUS

## 2020-07-18 MED ORDER — ACETAMINOPHEN 500 MG PO TABS
1000.0000 mg | ORAL_TABLET | Freq: Once | ORAL | Status: AC
  Administered 2020-07-18: 1000 mg via ORAL

## 2020-07-18 MED ORDER — KETOROLAC TROMETHAMINE 30 MG/ML IJ SOLN
INTRAMUSCULAR | Status: AC
  Filled 2020-07-18: qty 1

## 2020-07-18 MED ORDER — LIDOCAINE 2% (20 MG/ML) 5 ML SYRINGE
INTRAMUSCULAR | Status: AC
  Filled 2020-07-18: qty 5

## 2020-07-18 MED ORDER — PROMETHAZINE HCL 25 MG/ML IJ SOLN: 6.2500 mg | INTRAMUSCULAR | Status: DC | PRN

## 2020-07-18 MED ORDER — ONDANSETRON HCL 4 MG/2ML IJ SOLN
INTRAMUSCULAR | Status: DC | PRN
  Administered 2020-07-18: 4 mg via INTRAVENOUS

## 2020-07-18 MED ORDER — HYDROCODONE-ACETAMINOPHEN 5-325 MG PO TABS
1.0000 | ORAL_TABLET | ORAL | Status: DC | PRN
  Administered 2020-07-18 – 2020-07-19 (×5): 1 via ORAL

## 2020-07-18 MED ORDER — CEFAZOLIN SODIUM-DEXTROSE 1-4 GM/50ML-% IV SOLN
INTRAVENOUS | Status: AC
  Filled 2020-07-18: qty 50

## 2020-07-18 MED ORDER — CEFAZOLIN SODIUM-DEXTROSE 1-4 GM/50ML-% IV SOLN: 1.0000 g | Freq: Three times a day (TID) | INTRAVENOUS | Status: DC

## 2020-07-18 MED ORDER — PROPOFOL 10 MG/ML IV BOLUS
INTRAVENOUS | Status: DC | PRN
  Administered 2020-07-18: 300 mg via INTRAVENOUS
  Administered 2020-07-18: 50 mg via INTRAVENOUS

## 2020-07-18 MED ORDER — LITHIUM CARBONATE 300 MG PO CAPS
600.0000 mg | ORAL_CAPSULE | Freq: Every morning | ORAL | Status: DC
  Administered 2020-07-18: 600 mg via ORAL
  Filled 2020-07-18: qty 2

## 2020-07-18 MED ORDER — MEPERIDINE HCL 25 MG/ML IJ SOLN: 6.2500 mg | INTRAMUSCULAR | Status: DC | PRN

## 2020-07-18 MED ORDER — INSULIN ASPART 100 UNIT/ML ~~LOC~~ SOLN: 0.0000 [IU] | Freq: Three times a day (TID) | SUBCUTANEOUS | Status: DC

## 2020-07-18 MED ORDER — OXYCODONE HCL 5 MG PO TABS: 5.0000 mg | ORAL_TABLET | Freq: Once | ORAL | Status: DC | PRN

## 2020-07-18 MED ORDER — CEFAZOLIN SODIUM-DEXTROSE 2-4 GM/100ML-% IV SOLN
2.0000 g | INTRAVENOUS | Status: AC
  Administered 2020-07-18: 2 g via INTRAVENOUS

## 2020-07-18 MED ORDER — FENTANYL CITRATE (PF) 100 MCG/2ML IJ SOLN
INTRAMUSCULAR | Status: DC | PRN
  Administered 2020-07-18 (×7): 25 ug via INTRAVENOUS
  Administered 2020-07-18: 50 ug via INTRAVENOUS
  Administered 2020-07-18: 25 ug via INTRAVENOUS
  Administered 2020-07-18: 50 ug via INTRAVENOUS

## 2020-07-18 MED ORDER — EPHEDRINE SULFATE 50 MG/ML IJ SOLN
INTRAMUSCULAR | Status: DC | PRN
  Administered 2020-07-18 (×2): 10 mg via INTRAVENOUS

## 2020-07-18 MED ORDER — CEFAZOLIN SODIUM-DEXTROSE 1-4 GM/50ML-% IV SOLN
1.0000 g | Freq: Three times a day (TID) | INTRAVENOUS | Status: DC
  Administered 2020-07-18 – 2020-07-19 (×3): 1 g via INTRAVENOUS
  Filled 2020-07-18: qty 50

## 2020-07-18 MED ORDER — PROPOFOL 10 MG/ML IV BOLUS
INTRAVENOUS | Status: AC
  Filled 2020-07-18: qty 20

## 2020-07-18 MED ORDER — ENOXAPARIN SODIUM 40 MG/0.4ML ~~LOC~~ SOLN: 40.0000 mg | SUBCUTANEOUS | Status: DC

## 2020-07-18 MED ORDER — ACETAMINOPHEN 500 MG PO TABS
ORAL_TABLET | ORAL | Status: AC
  Filled 2020-07-18: qty 2

## 2020-07-18 MED ORDER — CEFAZOLIN SODIUM-DEXTROSE 2-4 GM/100ML-% IV SOLN
INTRAVENOUS | Status: AC
  Filled 2020-07-18: qty 100

## 2020-07-18 MED ORDER — LITHIUM CARBONATE 300 MG PO CAPS
300.0000 mg | ORAL_CAPSULE | ORAL | Status: DC
  Filled 2020-07-18: qty 1

## 2020-07-18 MED ORDER — LIDOCAINE 2% (20 MG/ML) 5 ML SYRINGE
INTRAMUSCULAR | Status: DC | PRN
  Administered 2020-07-18: 40 mg via INTRAVENOUS

## 2020-07-18 MED ORDER — MIDAZOLAM HCL 2 MG/2ML IJ SOLN: 0.5000 mg | Freq: Once | INTRAMUSCULAR | Status: DC | PRN

## 2020-07-18 MED ORDER — SODIUM CHLORIDE 0.9 % IV SOLN
INTRAVENOUS | Status: DC | PRN
  Administered 2020-07-18: 200 mL

## 2020-07-18 MED ORDER — METFORMIN HCL 500 MG PO TABS: 500.0000 mg | ORAL_TABLET | Freq: Two times a day (BID) | ORAL | Status: DC

## 2020-07-18 MED ORDER — EPHEDRINE 5 MG/ML INJ
INTRAVENOUS | Status: AC
  Filled 2020-07-18: qty 10

## 2020-07-18 MED ORDER — KETOROLAC TROMETHAMINE 15 MG/ML IJ SOLN
INTRAMUSCULAR | Status: AC
  Filled 2020-07-18: qty 1

## 2020-07-18 SURGICAL SUPPLY — 75 items
APL PRP STRL LF DISP 70% ISPRP (MISCELLANEOUS) ×1
APL SKNCLS STERI-STRIP NONHPOA (GAUZE/BANDAGES/DRESSINGS) ×1
APL SWBSTK 6 STRL LF DISP (MISCELLANEOUS)
APPLICATOR COTTON TIP 6 STRL (MISCELLANEOUS) IMPLANT
APPLICATOR COTTON TIP 6IN STRL (MISCELLANEOUS)
BAG DECANTER FOR FLEXI CONT (MISCELLANEOUS) ×2 IMPLANT
BAG DRN RND TRDRP ANRFLXCHMBR (UROLOGICAL SUPPLIES) ×1
BAG URINE DRAIN 2000ML AR STRL (UROLOGICAL SUPPLIES) ×2 IMPLANT
BENZOIN TINCTURE PRP APPL 2/3 (GAUZE/BANDAGES/DRESSINGS) ×2 IMPLANT
BLADE CLIPPER SENSICLIP SURGIC (BLADE) ×2 IMPLANT
BLADE HEX COATED 2.75 (ELECTRODE) ×2 IMPLANT
BLADE SURG 15 STRL LF DISP TIS (BLADE) ×2 IMPLANT
BLADE SURG 15 STRL SS (BLADE) ×4
BNDG COHESIVE 2X5 TAN NS LF (GAUZE/BANDAGES/DRESSINGS) ×2 IMPLANT
BNDG COHESIVE 2X5 TAN STRL LF (GAUZE/BANDAGES/DRESSINGS) IMPLANT
BNDG GAUZE ELAST 4 BULKY (GAUZE/BANDAGES/DRESSINGS) ×2 IMPLANT
BRUSH SCRUB EZ PLAIN DRY (MISCELLANEOUS) ×2 IMPLANT
CANISTER SUCT 1200ML W/VALVE (MISCELLANEOUS) IMPLANT
CANISTER SUCT 3000ML PPV (MISCELLANEOUS) ×2 IMPLANT
CATH FOLEY 2WAY SLVR  5CC 16FR (CATHETERS) ×2
CATH FOLEY 2WAY SLVR 5CC 16FR (CATHETERS) ×1 IMPLANT
CHLORAPREP W/TINT 26 (MISCELLANEOUS) ×2 IMPLANT
CLSR STERI-STRIP ANTIMIC 1/2X4 (GAUZE/BANDAGES/DRESSINGS) ×2 IMPLANT
COVER BACK TABLE 60X90IN (DRAPES) ×2 IMPLANT
COVER MAYO STAND STRL (DRAPES) ×4 IMPLANT
COVER WAND RF STERILE (DRAPES) ×2 IMPLANT
DISSECTOR ROUND CHERRY 3/8 STR (MISCELLANEOUS) IMPLANT
DRAPE INCISE IOBAN 66X45 STRL (DRAPES) ×2 IMPLANT
DRAPE LAPAROTOMY TRNSV 102X78 (DRAPES) ×2 IMPLANT
DRSG TEGADERM 4X4.75 (GAUZE/BANDAGES/DRESSINGS) ×2 IMPLANT
DRSG TELFA 3X8 NADH (GAUZE/BANDAGES/DRESSINGS) ×2 IMPLANT
ELECT REM PT RETURN 9FT ADLT (ELECTROSURGICAL) ×2
ELECTRODE REM PT RTRN 9FT ADLT (ELECTROSURGICAL) ×1 IMPLANT
GAUZE SPONGE 4X4 12PLY STRL LF (GAUZE/BANDAGES/DRESSINGS) ×2 IMPLANT
GLOVE BIO SURGEON STRL SZ 6.5 (GLOVE) ×6 IMPLANT
GLOVE BIO SURGEON STRL SZ8 (GLOVE) ×2 IMPLANT
GLOVE BIOGEL PI IND STRL 7.0 (GLOVE) ×2 IMPLANT
GLOVE BIOGEL PI INDICATOR 7.0 (GLOVE) ×2
GOWN STRL REUS W/TWL LRG LVL3 (GOWN DISPOSABLE) ×2 IMPLANT
GOWN STRL REUS W/TWL XL LVL3 (GOWN DISPOSABLE) ×4 IMPLANT
HOLDER FOLEY CATH W/STRAP (MISCELLANEOUS) ×2 IMPLANT
KIT TITAN ASSEMBLY (Erectile Restoration) ×2 IMPLANT
KIT TITAN ASSEMBLY STANDARD (Erectile Restoration) ×1 IMPLANT
KIT TITAN ASSEMBLY STD (Erectile Restoration) ×1 IMPLANT
KIT TURNOVER CYSTO (KITS) ×2 IMPLANT
NEEDLE HYPO 22GX1.5 SAFETY (NEEDLE) ×2 IMPLANT
NS IRRIG 500ML POUR BTL (IV SOLUTION) ×2 IMPLANT
PACK BASIN DAY SURGERY FS (CUSTOM PROCEDURE TRAY) ×2 IMPLANT
PENCIL SMOKE EVACUATOR (MISCELLANEOUS) ×2 IMPLANT
PLUG CATH AND CAP STER (CATHETERS) ×2 IMPLANT
PROS TITAN INFRA 0 ANG 18CM (Erectile Restoration) ×2 IMPLANT
PROSTHESIS TTN INFR 0 ANG 18CM (Erectile Restoration) ×1 IMPLANT
RESERVOIR 75CC LOCKOUT BIOFLEX (Erectile Restoration) ×2 IMPLANT
RETRACTOR STAY HOOK 5MM (MISCELLANEOUS) IMPLANT
RETRACTOR STERILE 25.8CMX11.3 (INSTRUMENTS) IMPLANT
RETRACTOR WILSON SYSTEM (INSTRUMENTS) IMPLANT
SPONGE LAP 4X18 RFD (DISPOSABLE) IMPLANT
STRIP CLOSURE SKIN 1/2X4 (GAUZE/BANDAGES/DRESSINGS) IMPLANT
SUPPORT SCROTAL LG STRP (MISCELLANEOUS) ×2 IMPLANT
SURGILUBE 2OZ TUBE FLIPTOP (MISCELLANEOUS) ×2 IMPLANT
SUT MNCRL AB 4-0 PS2 18 (SUTURE) ×4 IMPLANT
SUT VIC AB 2-0 UR5 27 (SUTURE) ×8 IMPLANT
SUT VIC AB 5-0 PS2 18 (SUTURE) IMPLANT
SUT VICRYL 0 27 CT2 27 ABS (SUTURE) ×2 IMPLANT
SYR 10ML LL (SYRINGE) ×2 IMPLANT
SYR 20ML LL LF (SYRINGE) ×2 IMPLANT
SYR 50ML LL SCALE MARK (SYRINGE) ×2 IMPLANT
SYR BULB EAR ULCER 3OZ GRN STR (SYRINGE) ×2 IMPLANT
SYR BULB IRRIG 60ML STRL (SYRINGE) IMPLANT
SYR CONTROL 10ML LL (SYRINGE) ×2 IMPLANT
TOWEL OR 17X26 10 PK STRL BLUE (TOWEL DISPOSABLE) ×2 IMPLANT
TRAY DSU PREP LF (CUSTOM PROCEDURE TRAY) ×2 IMPLANT
TUBE CONNECTING 12X1/4 (SUCTIONS) ×2 IMPLANT
WATER STERILE IRR 500ML POUR (IV SOLUTION) ×2 IMPLANT
YANKAUER SUCT BULB TIP NO VENT (SUCTIONS) ×2 IMPLANT

## 2020-07-18 NOTE — Anesthesia Postprocedure Evaluation (Signed)
Anesthesia Post Note  Patient: [name] Hall  Procedure(s) Performed: PENILE PROTHESIS INFLATABLE (N/A Penis)     Patient location during evaluation: PACU Anesthesia Type: General Level of consciousness: awake and alert, patient cooperative and oriented Pain management: pain level controlled Vital Signs Assessment: post-procedure vital signs reviewed and stable Respiratory status: spontaneous breathing, nonlabored ventilation and respiratory function stable Cardiovascular status: blood pressure returned to baseline and stable Postop Assessment: no apparent nausea or vomiting Anesthetic complications: no   No complications documented.  Last Vitals:  Vitals:   07/18/20 1232 07/18/20 1245  BP:  (P) 131/80  Pulse: 72 (P) 78  Resp: 11 (P) 16  Temp:  (P) 36.9 C  SpO2: 98% (P) 99%    Last Pain:  Vitals:   07/18/20 1245  TempSrc:   PainSc: (P) 4                  ,E. 

## 2020-07-18 NOTE — Anesthesia Procedure Notes (Signed)
Procedure Name: LMA Insertion Date/Time: 07/18/2020 9:15 AM Performed by: Justice Rocher, CRNA Pre-anesthesia Checklist: Patient identified, Emergency Drugs available, Suction available, Patient being monitored and Timeout performed Patient Re-evaluated:Patient Re-evaluated prior to induction Oxygen Delivery Method: Circle system utilized Preoxygenation: Pre-oxygenation with 100% oxygen Induction Type: IV induction Ventilation: Mask ventilation without difficulty LMA: LMA inserted LMA Size: 5.0 Number of attempts: 1 Airway Equipment and Method: Bite block Placement Confirmation: positive ETCO2,  breath sounds checked- equal and bilateral and CO2 detector Tube secured with: Tape Dental Injury: Teeth and Oropharynx as per pre-operative assessment

## 2020-07-18 NOTE — H&P (Signed)
H&P  Chief Complaint: ED  History of Present Illness: This 74 year old male presents for placement of a 3 piece inflatable penile prosthesis.  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  Over the past several years he has had progressive decline and I have had several consultations with him regarding placement of an IPP.  He desires to proceed at this point.  I have discussed the procedure as well as risks and complications.  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  He desires to proceed.  Past Medical History:  Diagnosis Date  . Bipolar disorder ([place])   . Diabetes mellitus    type 2  . DVT (deep venous thrombosis) ([place]) 2006   leg after joint replacement   . Dyspnea    with exertion occ  . ED (erectile dysfunction)   . Factor 5 Leiden mutation, heterozygous ([place])   . Hepatitis    41 yrs ago hepatitis a  . Hyperlipidemia   . Orthostatic hypotension   . Osteoarthritis   . PE (pulmonary embolism) 01/27/2017   after right tha revision  . PERSONAL HISTORY, VENOUS THROMBOSIS AND EMBOLISM   . Prostate CA ([place]) 2012   treated with radiation  . PROSTATE CANCER   . Stroke ([employer]) 15-20 yrs ago, saw on mri   TIA no residual from    Past Surgical History:  Procedure Laterality Date  . CATARACT EXTRACTION Bilateral   . colonoscopy     polyp located and tested negative  . COLONOSCOPY WITH PROPOFOL  10/2003, [id number]  . TONSILLECTOMY AND ADENOIDECTOMY     AS A CHILD  . TOTAL HIP ARTHROPLASTY     Left and right hip replacements  . TOTAL HIP REVISION Right 01/27/2017   Procedure: RIGHT TOTAL HIP REVISION;  Surgeon: Gaynelle Arabian, MD;  Location: [facility];  Service: Orthopedics;  Laterality: Right;    Home Medications:    Allergies:  Allergies  Allergen Reactions  . Altace [Ramipril] Hives  . Sulfonamide Derivatives  Hives and Itching  . Oxycodone Itching and Rash    Family History  Problem Relation Age of Onset  . Hyperlipidemia Mother   . Hypertension Mother   . Heart attack Father   . Hyperlipidemia Father   . Hypertension Father   . Sudden death Father   . Depression Sister     Social History:  reports that he quit smoking about 6 years ago. His smoking use included cigarettes. He has a 30.00 pack-year smoking history. He has never used smokeless tobacco. He reports that he does not drink alcohol and does not use drugs.  ROS: A complete review of systems was performed.  All systems are negative except for pertinent findings as noted.  Physical Exam:  Vital signs in last 24 hours: BP 138/76   Pulse 85   Temp 97.7 F (36.5 C) (Oral)   Resp 16   Ht 5\' 11"  (1.803 m)   Wt 89.9 kg   SpO2 100%   BMI 27.66 kg/m  Constitutional:  Alert and oriented, No acute distress Cardiovascular: Regular rate  Respiratory: Normal respiratory effort GI: Abdomen is soft, nontender, nondistended, no abdominal masses. No CVAT.  Genitourinary: Normal male phallus, testes are descended bilaterally and non-tender and without masses, scrotum is normal in appearance without lesions or masses, perineum is normal on inspection. Lymphatic: No lymphadenopathy Neurologic: Grossly intact, no  focal deficits Psychiatric: Normal mood and affect  Laboratory Data:  Recent Labs    07/18/20 0754  HGB 15.3  HCT 45.0    Recent Labs    07/18/20 0754  NA 142  K 4.2  CL 108  GLUCOSE 114*  BUN 18  CREATININE 1.00     Results for orders placed or performed during the hospital encounter of 07/18/20 (from the past 24 hour(s))  PT- INR Day of Surgery per protocol     Status: None   Collection Time: 07/18/20  7:20 AM  Result Value Ref Range   Prothrombin Time 13.1 11.4 - 15.2 seconds   INR 1.0 0.8 - 1.2  I-STAT, chem 8     Status: Abnormal   Collection Time: 07/18/20  7:54 AM  Result Value Ref Range   Sodium  142 135 - 145 mmol/L   Potassium 4.2 3.5 - 5.1 mmol/L   Chloride 108 98 - 111 mmol/L   BUN 18 8 - 23 mg/dL   Creatinine, Ser 1.00 0.61 - 1.24 mg/dL   Glucose, Bld 114 (H) 70 - 99 mg/dL   Calcium, Ion 1.29 1.15 - 1.40 mmol/L   TCO2 22 22 - 32 mmol/L   Hemoglobin 15.3 13.0 - 17.0 g/dL   HCT 45.0 39 - 52 %   Recent Results (from the past 240 hour(s))  SARS CORONAVIRUS 2 (TAT 6-24 HRS) Nasopharyngeal Nasopharyngeal Swab     Status: None   Collection Time: 07/15/20  1:20 PM   Specimen: Nasopharyngeal Swab  Result Value Ref Range Status   SARS Coronavirus 2 NEGATIVE NEGATIVE Final    Comment: (NOTE) SARS-CoV-2 target nucleic acids are NOT DETECTED.  The SARS-CoV-2 RNA is generally detectable in upper and lower respiratory specimens during the acute phase of infection. Negative results do not preclude SARS-CoV-2 infection, do not rule out co-infections with other pathogens, and should not be used as the sole basis for treatment or other patient management decisions. Negative results must be combined with clinical observations, patient history, and epidemiological information. The expected result is Negative.  Fact Sheet for Patients: SugarRoll.be  Fact Sheet for Healthcare Providers: https://www.woods-mathews.com/  This test is not yet approved or cleared by the [place] FDA and  has been authorized for detection and/or diagnosis of SARS-CoV-2 by FDA under an Emergency Use Authorization (EUA). This EUA will remain  in effect (meaning this test can be used) for the duration of the COVID-19 declaration under Se ction 564(b)(1) of the Act, 21 U.S.C. section 360bbb-3(b)(1), unless the authorization is terminated or revoked sooner.  Performed at [facility], [place] [address], [place], [place] [address]     Renal Function: Recent Labs    07/18/20 0754  CREATININE 1.00   Estimated Creatinine Clearance: 70.1 mL/min (by C-G  formula based on SCr of 1 mg/dL).  Radiologic Imaging: No results found.  Impression/Assessment:  Erectile dysfunction  Plan:  Placement of 3 piece inflatable Coloplast penile prosthesis

## 2020-07-18 NOTE — Anesthesia Preprocedure Evaluation (Addendum)
Anesthesia Evaluation  Patient identified by MRN, date of birth, ID band Patient awake    Reviewed: Allergy & Precautions, NPO status , Patient's Chart, lab work & pertinent test results  History of Anesthesia Complications Negative for: history of anesthetic complications  Airway Mallampati: II  TM Distance: >3 FB Neck ROM: Full    Dental  (+) Caps, Dental Advisory Given   Pulmonary former smoker, PE (2018) 07/15/2020 SARS coronavirus NEG   breath sounds clear to auscultation       Cardiovascular (-) hypertension+ DVT  negative cardio ROS   Rhythm:Regular Rate:Normal  '13 ECHO: EF 60-65%, trivial MR, trivial TR   Neuro/Psych PSYCHIATRIC DISORDERS Bipolar Disorder TIA   GI/Hepatic GERD  Controlled,(+) Hepatitis - (remote Hx)  Endo/Other  diabetes (glu 114), Oral Hypoglycemic Agents  Renal/GU negative Renal ROS   Prostate cancer    Musculoskeletal   Abdominal   Peds  Hematology Coumadin: Lovenox bridge Factor V Leiden   Anesthesia Other Findings   Reproductive/Obstetrics                            Anesthesia Physical Anesthesia Plan  ASA: III  Anesthesia Plan: General   Post-op Pain Management:    Induction: Intravenous  PONV Risk Score and Plan: 2 and Ondansetron and Dexamethasone  Airway Management Planned: LMA  Additional Equipment: None  Intra-op Plan:   Post-operative Plan:   Informed Consent: I have reviewed the patients History and Physical, chart, labs and discussed the procedure including the risks, benefits and alternatives for the proposed anesthesia with the patient or authorized representative who has indicated his/her understanding and acceptance.     Dental advisory given  Plan Discussed with: CRNA and Surgeon  Anesthesia Plan Comments:        Anesthesia Quick Evaluation

## 2020-07-18 NOTE — Transfer of Care (Signed)
Immediate Anesthesia Transfer of Care Note  Patient: [name] Hall  Procedure(s) Performed: Procedure(s) (LRB): PENILE PROTHESIS INFLATABLE (N/A)  Patient Location: PACU  Anesthesia Type: General  Level of Consciousness: awake, sedated, patient cooperative and responds to stimulation  Airway & Oxygen Therapy: Patient Spontanous Breathing and Patient connected to [place] 02 and soft FM   Post-op Assessment: Report given to PACU RN, Post -op Vital signs reviewed and stable and Patient moving all extremities  Post vital signs: Reviewed and stable  Complications: No apparent anesthesia complications

## 2020-07-18 NOTE — Op Note (Signed)
PATIENT:  [name] Hall  PRE-OPERATIVE DIAGNOSIS:  Organic erectile dysfunction  POST-OPERATIVE DIAGNOSIS:  Same  PROCEDURE:  Procedure(s): 3 piece inflatable penile prosthesis Surveyor, minerals)  SURGEON:  Lillette Boxer. , MD  ASST: C. Everlena Cooper, MD  INDICATION: He has had long-standing organic erectile dysfunction and has been treated with, unsuccessfully, with oral agents, vacuum erection device and intracavernosal injection therapy without success. He has elected to proceed with prosthesis implantation.  ANESTHESIA:  General  EBL:  Minimal  Device: 3 piece Titan: 75 cc reservoir, 18 cm cylinders and 2 cm rear-tip extenders on right and left sides  LOCAL MEDICATIONS USED:  None  SPECIMEN: None  DISPOSITION OF SPECIMEN:  N/A  Description of procedure: The patient was taken to the major operating room, placed on the table and administered general anesthesia in the supine position. His genitalia was then scrubbed for 10 minutes, painted and then sterilely draped. An official timeout was then performed. A 16 French Foley catheter was placed in the bladder and the bladder was drained and the catheter was plugged. A midline infrapubic incision was then made and then blunt dissection was used to expose the corpus cavernosum on the right and left sides. This was further cleared of overlying tissue using sharp technique. 2-0 Vicryl sutures were then placed anteriorly in the corpus cavernosum to service stay sutures and then an incision was then made in the corpus cavernosum first on the left-hand side with the knife and then extended proximally and distally with the curved Mayo scissors. I then dilated the corpus cavernosum with the Mentor dilators starting at 10 and progressing to 12 proximally and distally. I then irrigated the corpus cavernosum with antibiotic solution and measured the distance proximally and distally from the stay suture and was found to be 9 and 11 cm, respectively.I then  turned my attention to the contralateral corpus cavernosum and placed my stay sutures, made my corporotomy and dilated the corpus cavernosum in an identical fashion. This was measured and also was found to be 9 cm proximally and  11 distally. It was irrigated with anastomotic solution as was the scrotum. I then chose an 18 cm cylinder set with 2 cm rear-tip extenders and these were prepped while I prepared the site for reservoir placement.  I used blunt technique to dissect up to the external inguinal ring on the right-hand side. I found the external ring obliterated. Her therefore turned my attention to the left external inguinal ring and dissected bluntly to this location easily noted the ring. I swept the spermatic cord laterally and then was able to bluntly dissect with my index finger through the floor of the inguinal ring. I  then developed a space behind the symphysis pubis for the reservoir. I irrigated the space with anastomotic solution and then placed the reservoir in this location. I then filled the reservoir with 75 cc of sterile saline.  Attention was redirected to the corporotomies where the cylinders were then placed by first fixing the suture to the distal aspect of the right cylinder to a straight needle. This was then loaded on the [facility] inserter and passed through the corporotomy and distally. I then advanced the straight needle with the Furlow inserter out through the glans and this was grasped with a hemostat and pulled through the glans and the suture was secured with a hemostat. I then performed an identical maneuver on the contralateral side. After this was performed I irrigated both corpus cavernosum and inserted the distal portion of  the cylinder through the corporotomies and pulled this to the end of the corpora with the suture. The proximal aspect with the rear-tip extender was then passed through the corporotomy and into the seated position on each side. I then connected reservoir  tubing to a syringe filled with sterile saline and inflated the device. I noted a good straight erection with both cylinders equidistant under the glans and no buckling of the cylinders. I therefore deflated the device and closed the corporotomies with running 2-0 Vicryl suture. The cylinder was then connected to the pump after excising the excess tubing with appropriate shodded hemostats in place and then I used the supplied connectors to make the connection. I then again cycled the device with the pump and it cycled properly. I deflated the device and pumped it up about three quarters of the way to aid with hemostasis. I irrigated the scrotum once again with metabolic solution.  I then developed the scrotal pocket in the right hemiscrotum in place the pump in this location. I irrigated the wound one last time with antibiotic irrigation and then closed the deep scrotal tissue over the tubing and pump with running 2-0 vicryl suture. A second layer was then closed over this first layer with running 2- 0 vicryl, and running subcutcular suture w/ 4-0 monocryl performed.. Incision dressed with a telfa and tegaderm. I then wrapped the scrotum and penis with a Curlex. The catheter was connected to closed system drainage and the patient was awakened and taken recovery room in stable and satisfactory condition. He tolerated the procedure well and there were no intraoperative complications. Needle sponge and instrument counts were correct at the end of the operation.

## 2020-07-19 ENCOUNTER — Encounter (HOSPITAL_BASED_OUTPATIENT_CLINIC_OR_DEPARTMENT_OTHER): Payer: Self-pay | Admitting: Urology

## 2020-07-19 DIAGNOSIS — N529 Male erectile dysfunction, unspecified: Secondary | ICD-10-CM | POA: Diagnosis not present

## 2020-07-19 LAB — GLUCOSE, CAPILLARY: Glucose-Capillary: 103 mg/dL — ABNORMAL HIGH (ref 70–99)

## 2020-07-19 MED ORDER — HYDROCODONE-ACETAMINOPHEN 5-325 MG PO TABS
ORAL_TABLET | ORAL | Status: AC
  Filled 2020-07-19: qty 1

## 2020-07-19 MED ORDER — KETOROLAC TROMETHAMINE 15 MG/ML IJ SOLN
INTRAMUSCULAR | Status: AC
  Filled 2020-07-19: qty 1

## 2020-07-19 MED ORDER — METHOCARBAMOL 500 MG PO TABS
ORAL_TABLET | ORAL | Status: AC
  Filled 2020-07-19: qty 1

## 2020-07-19 MED ORDER — CEFAZOLIN SODIUM-DEXTROSE 1-4 GM/50ML-% IV SOLN
INTRAVENOUS | Status: AC
  Filled 2020-07-19: qty 50

## 2020-07-19 MED ORDER — HYDROCODONE-ACETAMINOPHEN 5-325 MG PO TABS
1.0000 | ORAL_TABLET | ORAL | 0 refills | Status: DC | PRN
Start: 2020-07-19 — End: 2021-10-30

## 2020-07-19 NOTE — Discharge Instructions (Signed)
Penile Prosthesis Implantation, Care After This sheet gives you information about how to care for yourself after your procedure. Your health care provider may also give you more specific instructions. If you have problems or questions, contact your health care provider. What can I expect after the procedure? After the procedure, it is common to have:  Pain. Follow these instructions at home: Medicines  Take over-the-counter and prescription medicines only as told by your health care provider.  If you were prescribed an antibiotic medicine, take it as told by your health care provider. Do not stop taking the antibiotic even if you start to feel better. Incision care   Follow instructions from your health care provider about how to take care of your incision(s). Make sure you: ? Wash your hands with soap and water before you change your bandage (dressing). If soap and water are not available, use hand sanitizer. ? Change your dressing as told by your health care provider. ? Leave stitches (sutures), skin glue, or adhesive strips in place. These skin closures may need to stay in place for 2 weeks or longer. If adhesive strip edges start to loosen and curl up, you may trim the loose edges. Do not remove adhesive strips completely unless your health care provider tells you to do that.  Check your incision area every day for signs of infection. Check for: ? More redness, swelling, or pain. ? Fluid or blood. ? Warmth. ? Pus or a bad smell. Activity  Do not drive or use heavy machinery while taking prescription pain medicine.  Do not drive for 24 hours if you were given a medicine to help you relax (sedative) during the procedure.  Do not lift anything that is heavier than 10 lb (4.5 kg), or the limit that you are told, until your health care provider says that it is safe.  Rest as told by your health care provider.  Get up to take short walks every 1-2 hours. This is important to improve  blood flow and breathing. Ask for help if you feel weak or unsteady.  Return to your normal activities as told by your health care provider. Ask your health care provider what activities are safe for you. Managing pain and swelling  If directed, put ice on the affected area: ? Put ice in a plastic bag. ? Place a towel between your skin and the bag. ? Leave the ice on for 20 minutes, 2-3 times a day. General instructions  Follow instructions from your health care provider about eating or drinking restrictions. For the first 24 hours after the procedure, you may be told to follow a clear liquid diet. This diet is limited to liquids that you can see through, such as clear broth or bouillon, black coffee or tea, clear juice, clear soft drinks or sports drinks, gelatin dessert, and flavored ice.  Do not take baths, swim, or use a hot tub until your health care provider approves. Ask your health care provider if you may take showers. You may only be allowed to take sponge baths.  Wear scrotal support only as told by your health care provider. If your scrotal support irritates your incision area, you may remove the support.  To prevent or treat constipation while you are taking prescription pain medicine, your health care provider may recommend that you: ? Drink enough fluid to keep your urine pale yellow. ? Take over-the-counter or prescription medicines. ? Eat foods that are high in fiber, such as fresh fruits and  vegetables, whole grains, and beans. ? Limit foods that are high in fat and processed sugars, such as fried and sweet foods.  Keep all follow-up visits as told by your health care provider. This is important. Contact a health care provider if:  The implant inflates on its own.  The implant does not work or it stops working.  Your penis becomes curved or it changes shape. Get help right away if:  You have more redness, swelling, or pain around your incision(s) or in your  scrotum.  You have fluid or blood coming from your incision(s).  Your incision area feels warm to the touch.  You have pus or a bad smell coming from your incision area.  You have pain in your penis that gets worse.  The skin on your penis becomes dark or discolored. Summary  Check your incision area every day for signs of infection, such as more redness or swelling.  Get up to take short walks every 1-2 hours. This is important to improve blood flow and breathing. Ask for help if you feel weak or unsteady.  Wear scrotal support only as told by your health care provider. If your scrotal support irritates your incision area, you may remove the support. This information is not intended to replace advice given to you by your health care provider. Make sure you discuss any questions you have with your health care provider. Document Revised: 10/29/2017 Document Reviewed: 05/11/2017 Elsevier Patient Education  [place] Instructions  Activity: Get plenty of rest for the remainder of the day. A responsible individual must stay with you for 24 hours following the procedure.  For the next 24 hours, DO NOT: -Drive a car -Paediatric nurse -Drink alcoholic beverages -Take any medication unless instructed by your physician -Make any legal decisions or sign important papers.  Meals: Start with liquid foods such as gelatin or soup. Progress to regular foods as tolerated. Avoid greasy, spicy, heavy foods. If nausea and/or vomiting occur, drink only clear liquids until the nausea and/or vomiting subsides. Call your physician if vomiting continues.  Special Instructions/Symptoms: Your throat may feel dry or sore from the anesthesia or the breathing tube placed in your throat during surgery. If this causes discomfort, gargle with warm salt water. The discomfort should disappear within 24 hours.  If you had a scopolamine patch placed behind your ear for the  management of post- operative nausea and/or vomiting:  1. The medication in the patch is effective for 72 hours, after which it should be removed.  Wrap patch in a tissue and discard in the trash. Wash hands thoroughly with soap and water. 2. You may remove the patch earlier than 72 hours if you experience unpleasant side effects which may include dry mouth, dizziness or visual disturbances. 3. Avoid touching the patch. Wash your hands with soap and water after contact with the patch.

## 2020-07-23 NOTE — Discharge Summary (Signed)
Patient ID: EVART MCDONNELL MRN: [id number] DOB/AGE: January 05, 1946 74 y.o.  Admit date: 07/18/2020 Discharge date: 07/23/2020  Primary Care Physician:  Hulan Fess, MD  Discharge Diagnoses:   Erectile dysfunction Present on Admission: . Erectile dysfunction   Consults:  None     Discharge Medications: Allergies as of 07/19/2020      Reactions   Altace [ramipril] Hives   Sulfonamide Derivatives Hives, Itching   Oxycodone Itching, Rash      Medication List    STOP taking these medications   enoxaparin 40 MG/0.4ML injection Commonly known as: LOVENOX     TAKE these medications   atorvastatin 80 MG tablet Commonly known as: LIPITOR Take 80 mg by mouth at bedtime.   HYDROcodone-acetaminophen 5-325 MG tablet Commonly known as: NORCO/VICODIN Take 1 tablet by mouth every 4 (four) hours as needed for moderate pain. Notes to patient: Next dose can be taken at 10am   lithium carbonate 300 MG capsule Take 300-600 mg by mouth See admin instructions. 300 mg in the morning and 600 mg in the evening Notes to patient: Take when you get home   metFORMIN 500 MG tablet Commonly known as: GLUCOPHAGE Take 500 mg by mouth daily.   methocarbamol 500 MG tablet Commonly known as: ROBAXIN Take 1 tablet (500 mg total) by mouth every 6 (six) hours as needed for muscle spasms.   ONE TOUCH ULTRA TEST test strip Generic drug: glucose blood   traZODone 100 MG tablet Commonly known as: DESYREL Take 100 mg by mouth at bedtime.   triamcinolone cream 0.1 % Commonly known as: KENALOG Apply 1 application topically 2 (two) times daily.   warfarin 5 MG tablet Commonly known as: COUMADIN Take 5 mg by mouth See admin instructions. Takes 1 tablet (5 mg) on  All days of week except monday What changed: Another medication with the same name was changed. Make sure you understand how and when to take each.   warfarin 7.5 MG tablet Commonly known as: COUMADIN Take 1 tablet (7.5 mg total) by mouth  one time only at 6 PM. What changed: additional instructions        Significant Diagnostic Studies:  No results found.  Brief H and P: For complete details please refer to admission H and P, but in brief the patient is admitted for placement of 3 piece inflatable penile prosthesis  Hospital Course: Patient was admitted through presurgical area for his placement of 3 piece IPP.  He tolerated procedure well and was discharged home on postoperative day #1. Active Problems:   Erectile dysfunction   Day of Discharge BP (!) 105/59 (BP Location: Right Arm)   Pulse 72   Temp 98.7 F (37.1 C)   Resp 18   Ht 5\' 11"  (1.803 m)   Wt 89.9 kg   SpO2 98%   BMI 27.66 kg/m   Results for orders placed or performed during the hospital encounter of 07/18/20 (from the past 24 hour(s))  LAB REPORT - SCANNED     Status: None   Collection Time: 07/23/20 10:11 AM   Narrative   Ordered by an unspecified provider.    Physical Exam: General: Alert and awake oriented x3 not in any acute distress. HEENT: anicteric sclera, pupils reactive to light and accommodation CVS: S1-S2 clear no murmur rubs or gallops Chest: clear to auscultation bilaterally, no wheezing rales or rhonchi Abdomen: soft nontender, nondistended, normal bowel sounds, no organomegaly Extremities: no cyanosis, clubbing or edema noted bilaterally Neuro: Cranial nerves II-XII  intact, no focal neurological deficits  Disposition: Home  Diet: No restrictions  Activity: Discussed with patient   TESTS THAT NEED FOLLOW-UP  Not applicable  DISCHARGE FOLLOW-UP    Time spent on Discharge:  15 minutes  Signed: Lillette Boxer  07/23/2020, 1:33 PM

## 2020-12-02 DIAGNOSIS — M533 Sacrococcygeal disorders, not elsewhere classified: Secondary | ICD-10-CM | POA: Diagnosis not present

## 2020-12-05 DIAGNOSIS — M25551 Pain in right hip: Secondary | ICD-10-CM | POA: Diagnosis not present

## 2020-12-05 DIAGNOSIS — M5459 Other low back pain: Secondary | ICD-10-CM | POA: Diagnosis not present

## 2020-12-18 DIAGNOSIS — E118 Type 2 diabetes mellitus with unspecified complications: Secondary | ICD-10-CM | POA: Diagnosis not present

## 2020-12-18 DIAGNOSIS — Z7901 Long term (current) use of anticoagulants: Secondary | ICD-10-CM | POA: Diagnosis not present

## 2020-12-18 DIAGNOSIS — E1149 Type 2 diabetes mellitus with other diabetic neurological complication: Secondary | ICD-10-CM | POA: Diagnosis not present

## 2020-12-18 DIAGNOSIS — E1169 Type 2 diabetes mellitus with other specified complication: Secondary | ICD-10-CM | POA: Diagnosis not present

## 2020-12-18 DIAGNOSIS — E1140 Type 2 diabetes mellitus with diabetic neuropathy, unspecified: Secondary | ICD-10-CM | POA: Diagnosis not present

## 2020-12-18 DIAGNOSIS — I714 Abdominal aortic aneurysm, without rupture: Secondary | ICD-10-CM | POA: Diagnosis not present

## 2020-12-18 DIAGNOSIS — E785 Hyperlipidemia, unspecified: Secondary | ICD-10-CM | POA: Diagnosis not present

## 2020-12-19 DIAGNOSIS — I8312 Varicose veins of left lower extremity with inflammation: Secondary | ICD-10-CM | POA: Diagnosis not present

## 2020-12-19 DIAGNOSIS — L309 Dermatitis, unspecified: Secondary | ICD-10-CM | POA: Diagnosis not present

## 2020-12-19 DIAGNOSIS — L814 Other melanin hyperpigmentation: Secondary | ICD-10-CM | POA: Diagnosis not present

## 2020-12-19 DIAGNOSIS — L821 Other seborrheic keratosis: Secondary | ICD-10-CM | POA: Diagnosis not present

## 2020-12-19 DIAGNOSIS — D225 Melanocytic nevi of trunk: Secondary | ICD-10-CM | POA: Diagnosis not present

## 2020-12-19 DIAGNOSIS — L570 Actinic keratosis: Secondary | ICD-10-CM | POA: Diagnosis not present

## 2020-12-19 DIAGNOSIS — I872 Venous insufficiency (chronic) (peripheral): Secondary | ICD-10-CM | POA: Diagnosis not present

## 2020-12-19 DIAGNOSIS — I8311 Varicose veins of right lower extremity with inflammation: Secondary | ICD-10-CM | POA: Diagnosis not present

## 2021-02-05 DIAGNOSIS — D6851 Activated protein C resistance: Secondary | ICD-10-CM | POA: Diagnosis not present

## 2021-02-05 DIAGNOSIS — E785 Hyperlipidemia, unspecified: Secondary | ICD-10-CM | POA: Diagnosis not present

## 2021-02-05 DIAGNOSIS — Z7901 Long term (current) use of anticoagulants: Secondary | ICD-10-CM | POA: Diagnosis not present

## 2021-02-05 DIAGNOSIS — Z862 Personal history of diseases of the blood and blood-forming organs and certain disorders involving the immune mechanism: Secondary | ICD-10-CM | POA: Diagnosis not present

## 2021-02-05 DIAGNOSIS — Z01818 Encounter for other preprocedural examination: Secondary | ICD-10-CM | POA: Diagnosis not present

## 2021-02-05 DIAGNOSIS — I714 Abdominal aortic aneurysm, without rupture: Secondary | ICD-10-CM | POA: Diagnosis not present

## 2021-02-05 DIAGNOSIS — I8310 Varicose veins of unspecified lower extremity with inflammation: Secondary | ICD-10-CM | POA: Diagnosis not present

## 2021-02-05 DIAGNOSIS — Z86711 Personal history of pulmonary embolism: Secondary | ICD-10-CM | POA: Diagnosis not present

## 2021-02-05 DIAGNOSIS — E118 Type 2 diabetes mellitus with unspecified complications: Secondary | ICD-10-CM | POA: Diagnosis not present

## 2021-02-05 DIAGNOSIS — I82409 Acute embolism and thrombosis of unspecified deep veins of unspecified lower extremity: Secondary | ICD-10-CM | POA: Diagnosis not present

## 2021-02-07 DIAGNOSIS — Z7901 Long term (current) use of anticoagulants: Secondary | ICD-10-CM | POA: Diagnosis not present

## 2021-02-25 DIAGNOSIS — Z7901 Long term (current) use of anticoagulants: Secondary | ICD-10-CM | POA: Diagnosis not present

## 2021-04-02 DIAGNOSIS — M47816 Spondylosis without myelopathy or radiculopathy, lumbar region: Secondary | ICD-10-CM | POA: Diagnosis not present

## 2021-04-02 DIAGNOSIS — I10 Essential (primary) hypertension: Secondary | ICD-10-CM | POA: Diagnosis not present

## 2021-04-02 DIAGNOSIS — M5136 Other intervertebral disc degeneration, lumbar region: Secondary | ICD-10-CM | POA: Diagnosis not present

## 2021-04-02 DIAGNOSIS — M47812 Spondylosis without myelopathy or radiculopathy, cervical region: Secondary | ICD-10-CM | POA: Diagnosis not present

## 2021-04-02 DIAGNOSIS — M4802 Spinal stenosis, cervical region: Secondary | ICD-10-CM | POA: Diagnosis not present

## 2021-04-02 DIAGNOSIS — M542 Cervicalgia: Secondary | ICD-10-CM | POA: Diagnosis not present

## 2021-04-02 DIAGNOSIS — M4316 Spondylolisthesis, lumbar region: Secondary | ICD-10-CM | POA: Diagnosis not present

## 2021-04-08 DIAGNOSIS — Z7901 Long term (current) use of anticoagulants: Secondary | ICD-10-CM | POA: Diagnosis not present

## 2021-04-10 DIAGNOSIS — M47812 Spondylosis without myelopathy or radiculopathy, cervical region: Secondary | ICD-10-CM | POA: Diagnosis not present

## 2021-04-10 DIAGNOSIS — M47816 Spondylosis without myelopathy or radiculopathy, lumbar region: Secondary | ICD-10-CM | POA: Diagnosis not present

## 2021-05-07 DIAGNOSIS — M542 Cervicalgia: Secondary | ICD-10-CM | POA: Diagnosis not present

## 2021-05-07 DIAGNOSIS — M4802 Spinal stenosis, cervical region: Secondary | ICD-10-CM | POA: Diagnosis not present

## 2021-05-07 DIAGNOSIS — M47812 Spondylosis without myelopathy or radiculopathy, cervical region: Secondary | ICD-10-CM | POA: Diagnosis not present

## 2021-06-09 DIAGNOSIS — I714 Abdominal aortic aneurysm, without rupture: Secondary | ICD-10-CM | POA: Diagnosis not present

## 2021-06-09 DIAGNOSIS — D6869 Other thrombophilia: Secondary | ICD-10-CM | POA: Diagnosis not present

## 2021-06-09 DIAGNOSIS — E1169 Type 2 diabetes mellitus with other specified complication: Secondary | ICD-10-CM | POA: Diagnosis not present

## 2021-06-09 DIAGNOSIS — Z7901 Long term (current) use of anticoagulants: Secondary | ICD-10-CM | POA: Diagnosis not present

## 2021-06-09 DIAGNOSIS — E785 Hyperlipidemia, unspecified: Secondary | ICD-10-CM | POA: Diagnosis not present

## 2021-06-09 DIAGNOSIS — E1140 Type 2 diabetes mellitus with diabetic neuropathy, unspecified: Secondary | ICD-10-CM | POA: Diagnosis not present

## 2021-06-09 DIAGNOSIS — Z79899 Other long term (current) drug therapy: Secondary | ICD-10-CM | POA: Diagnosis not present

## 2021-06-09 DIAGNOSIS — Z0000 Encounter for general adult medical examination without abnormal findings: Secondary | ICD-10-CM | POA: Diagnosis not present

## 2021-06-09 DIAGNOSIS — G4700 Insomnia, unspecified: Secondary | ICD-10-CM | POA: Diagnosis not present

## 2021-06-09 DIAGNOSIS — D696 Thrombocytopenia, unspecified: Secondary | ICD-10-CM | POA: Diagnosis not present

## 2021-06-12 DIAGNOSIS — I872 Venous insufficiency (chronic) (peripheral): Secondary | ICD-10-CM | POA: Diagnosis not present

## 2021-06-12 DIAGNOSIS — L820 Inflamed seborrheic keratosis: Secondary | ICD-10-CM | POA: Diagnosis not present

## 2021-06-16 DIAGNOSIS — I6789 Other cerebrovascular disease: Secondary | ICD-10-CM | POA: Diagnosis not present

## 2021-06-16 DIAGNOSIS — E785 Hyperlipidemia, unspecified: Secondary | ICD-10-CM | POA: Diagnosis not present

## 2021-06-16 DIAGNOSIS — I10 Essential (primary) hypertension: Secondary | ICD-10-CM | POA: Diagnosis not present

## 2021-06-16 DIAGNOSIS — E1149 Type 2 diabetes mellitus with other diabetic neurological complication: Secondary | ICD-10-CM | POA: Diagnosis not present

## 2021-06-16 DIAGNOSIS — E1140 Type 2 diabetes mellitus with diabetic neuropathy, unspecified: Secondary | ICD-10-CM | POA: Diagnosis not present

## 2021-06-16 DIAGNOSIS — G4700 Insomnia, unspecified: Secondary | ICD-10-CM | POA: Diagnosis not present

## 2021-06-16 DIAGNOSIS — E1169 Type 2 diabetes mellitus with other specified complication: Secondary | ICD-10-CM | POA: Diagnosis not present

## 2021-06-19 DIAGNOSIS — M47812 Spondylosis without myelopathy or radiculopathy, cervical region: Secondary | ICD-10-CM | POA: Diagnosis not present

## 2021-07-07 DIAGNOSIS — Z7901 Long term (current) use of anticoagulants: Secondary | ICD-10-CM | POA: Diagnosis not present

## 2021-07-14 DIAGNOSIS — M5412 Radiculopathy, cervical region: Secondary | ICD-10-CM | POA: Diagnosis not present

## 2021-07-14 DIAGNOSIS — M47812 Spondylosis without myelopathy or radiculopathy, cervical region: Secondary | ICD-10-CM | POA: Diagnosis not present

## 2021-08-07 DIAGNOSIS — Z7901 Long term (current) use of anticoagulants: Secondary | ICD-10-CM | POA: Diagnosis not present

## 2021-08-14 DIAGNOSIS — L237 Allergic contact dermatitis due to plants, except food: Secondary | ICD-10-CM | POA: Diagnosis not present

## 2021-08-18 DIAGNOSIS — M5412 Radiculopathy, cervical region: Secondary | ICD-10-CM | POA: Diagnosis not present

## 2021-08-19 DIAGNOSIS — E1169 Type 2 diabetes mellitus with other specified complication: Secondary | ICD-10-CM | POA: Diagnosis not present

## 2021-08-19 DIAGNOSIS — E785 Hyperlipidemia, unspecified: Secondary | ICD-10-CM | POA: Diagnosis not present

## 2021-08-19 DIAGNOSIS — E1149 Type 2 diabetes mellitus with other diabetic neurological complication: Secondary | ICD-10-CM | POA: Diagnosis not present

## 2021-08-19 DIAGNOSIS — I6789 Other cerebrovascular disease: Secondary | ICD-10-CM | POA: Diagnosis not present

## 2021-08-19 DIAGNOSIS — G4700 Insomnia, unspecified: Secondary | ICD-10-CM | POA: Diagnosis not present

## 2021-08-19 DIAGNOSIS — I10 Essential (primary) hypertension: Secondary | ICD-10-CM | POA: Diagnosis not present

## 2021-08-19 DIAGNOSIS — E1140 Type 2 diabetes mellitus with diabetic neuropathy, unspecified: Secondary | ICD-10-CM | POA: Diagnosis not present

## 2021-08-28 DIAGNOSIS — Z7901 Long term (current) use of anticoagulants: Secondary | ICD-10-CM | POA: Diagnosis not present

## 2021-08-28 DIAGNOSIS — W19XXXA Unspecified fall, initial encounter: Secondary | ICD-10-CM | POA: Diagnosis not present

## 2021-08-28 DIAGNOSIS — D6859 Other primary thrombophilia: Secondary | ICD-10-CM | POA: Diagnosis not present

## 2021-08-28 DIAGNOSIS — D6851 Activated protein C resistance: Secondary | ICD-10-CM | POA: Diagnosis not present

## 2021-08-28 DIAGNOSIS — M461 Sacroiliitis, not elsewhere classified: Secondary | ICD-10-CM | POA: Diagnosis not present

## 2021-09-02 DIAGNOSIS — M25552 Pain in left hip: Secondary | ICD-10-CM | POA: Diagnosis not present

## 2021-09-02 DIAGNOSIS — Z96642 Presence of left artificial hip joint: Secondary | ICD-10-CM | POA: Diagnosis not present

## 2021-09-03 DIAGNOSIS — M5412 Radiculopathy, cervical region: Secondary | ICD-10-CM | POA: Diagnosis not present

## 2021-09-03 DIAGNOSIS — M542 Cervicalgia: Secondary | ICD-10-CM | POA: Diagnosis not present

## 2021-09-10 DIAGNOSIS — M25552 Pain in left hip: Secondary | ICD-10-CM | POA: Diagnosis not present

## 2021-09-17 DIAGNOSIS — M25552 Pain in left hip: Secondary | ICD-10-CM | POA: Diagnosis not present

## 2021-09-19 DIAGNOSIS — M25552 Pain in left hip: Secondary | ICD-10-CM | POA: Diagnosis not present

## 2021-09-24 DIAGNOSIS — M25552 Pain in left hip: Secondary | ICD-10-CM | POA: Diagnosis not present

## 2021-09-26 DIAGNOSIS — M25552 Pain in left hip: Secondary | ICD-10-CM | POA: Diagnosis not present

## 2021-09-30 DIAGNOSIS — M25552 Pain in left hip: Secondary | ICD-10-CM | POA: Diagnosis not present

## 2021-10-06 DIAGNOSIS — M25552 Pain in left hip: Secondary | ICD-10-CM | POA: Diagnosis not present

## 2021-10-13 DIAGNOSIS — U071 COVID-19: Secondary | ICD-10-CM | POA: Diagnosis not present

## 2021-10-14 ENCOUNTER — Emergency Department (HOSPITAL_COMMUNITY)
Admission: EM | Admit: 2021-10-14 | Discharge: 2021-10-14 | Disposition: A | Discharge status: Home / Self Care | Payer: No Typology Code available for payment source | Attending: Emergency Medicine | Admitting: Emergency Medicine

## 2021-10-14 ENCOUNTER — Encounter (HOSPITAL_COMMUNITY): Payer: Self-pay

## 2021-10-14 ENCOUNTER — Emergency Department (HOSPITAL_COMMUNITY): Payer: No Typology Code available for payment source

## 2021-10-14 DIAGNOSIS — Z87891 Personal history of nicotine dependence: Secondary | ICD-10-CM | POA: Diagnosis not present

## 2021-10-14 DIAGNOSIS — E1169 Type 2 diabetes mellitus with other specified complication: Secondary | ICD-10-CM | POA: Insufficient documentation

## 2021-10-14 DIAGNOSIS — Z7901 Long term (current) use of anticoagulants: Secondary | ICD-10-CM | POA: Insufficient documentation

## 2021-10-14 DIAGNOSIS — Z7984 Long term (current) use of oral hypoglycemic drugs: Secondary | ICD-10-CM | POA: Diagnosis not present

## 2021-10-14 DIAGNOSIS — Z96643 Presence of artificial hip joint, bilateral: Secondary | ICD-10-CM | POA: Insufficient documentation

## 2021-10-14 DIAGNOSIS — U071 COVID-19: Secondary | ICD-10-CM | POA: Insufficient documentation

## 2021-10-14 DIAGNOSIS — R0602 Shortness of breath: Secondary | ICD-10-CM | POA: Diagnosis present

## 2021-10-14 DIAGNOSIS — I10 Essential (primary) hypertension: Secondary | ICD-10-CM | POA: Diagnosis not present

## 2021-10-14 DIAGNOSIS — Z8546 Personal history of malignant neoplasm of prostate: Secondary | ICD-10-CM | POA: Diagnosis not present

## 2021-10-14 DIAGNOSIS — R053 Chronic cough: Secondary | ICD-10-CM

## 2021-10-14 LAB — CBC WITH DIFFERENTIAL/PLATELET
Abs Immature Granulocytes: 0.05 10*3/uL (ref 0.00–0.07)
Basophils Absolute: 0 10*3/uL (ref 0.0–0.1)
Basophils Relative: 1 %
Eosinophils Absolute: 0.2 10*3/uL (ref 0.0–0.5)
Eosinophils Relative: 3 %
HCT: 42 % (ref 39.0–52.0)
Hemoglobin: 13.5 g/dL (ref 13.0–17.0)
Immature Granulocytes: 1 %
Lymphocytes Relative: 26 %
Lymphs Abs: 1.3 10*3/uL (ref 0.7–4.0)
MCH: 30.3 pg (ref 26.0–34.0)
MCHC: 32.1 g/dL (ref 30.0–36.0)
MCV: 94.4 fL (ref 80.0–100.0)
Monocytes Absolute: 0.8 10*3/uL (ref 0.1–1.0)
Monocytes Relative: 16 %
Neutro Abs: 2.7 10*3/uL (ref 1.7–7.7)
Neutrophils Relative %: 53 %
Platelets: 147 10*3/uL — ABNORMAL LOW (ref 150–400)
RBC: 4.45 MIL/uL (ref 4.22–5.81)
RDW: 14.1 % (ref 11.5–15.5)
WBC: 5.1 10*3/uL (ref 4.0–10.5)
nRBC: 0 % (ref 0.0–0.2)

## 2021-10-14 LAB — BASIC METABOLIC PANEL
Anion gap: 6 (ref 5–15)
BUN: 14 mg/dL (ref 8–23)
CO2: 25 mmol/L (ref 22–32)
Calcium: 9.3 mg/dL (ref 8.9–10.3)
Chloride: 108 mmol/L (ref 98–111)
Creatinine, Ser: 1.08 mg/dL (ref 0.61–1.24)
GFR, Estimated: >60 mL/min (ref >60)
Glucose, Bld: 86 mg/dL (ref 70–99)
Potassium: 4.5 mmol/L (ref 3.5–5.1)
Sodium: 139 mmol/L (ref 135–145)

## 2021-10-14 LAB — RESP PANEL BY RT-PCR (FLU A&B, COVID) ARPGX2
Influenza A by PCR: NEGATIVE
Influenza B by PCR: NEGATIVE
SARS Coronavirus 2 by RT PCR: POSITIVE — AB

## 2021-10-14 MED ORDER — ALBUTEROL SULFATE HFA 108 (90 BASE) MCG/ACT IN AERS
2.0000 | INHALATION_SPRAY | Freq: Once | RESPIRATORY_TRACT | Status: AC
  Administered 2021-10-14: 2 via RESPIRATORY_TRACT
  Filled 2021-10-14: qty 6.7

## 2021-10-14 MED ORDER — ACETAMINOPHEN-CODEINE #3 300-30 MG PO TABS: 1.0000 | ORAL_TABLET | Freq: Three times a day (TID) | ORAL | 0 refills | Status: DC | PRN

## 2021-10-14 MED ORDER — GUAIFENESIN 100 MG/5ML PO LIQD
5.0000 mL | Freq: Once | ORAL | Status: AC
  Administered 2021-10-14: 5 mL via ORAL
  Filled 2021-10-14: qty 10

## 2021-10-14 NOTE — ED Provider Notes (Signed)
[facility] DEPT Provider Note   CSN: [id number] Arrival date & time: 10/14/21  0854     History Chief Complaint  Patient presents with   Cough   Shortness of Breath    [name] Hall is a 75 y.o. male with a past medical history significant for prior PE, DVTs who is on warfarin for anticoagulation who presents with 5 weeks of, with some shortness of breath today.  Patient tested positive for COVID last Saturday, reports home oxygen level was 84% on room air this morning.  Otherwise feeling well, no fever, chills, shortness of breath, nausea, vomiting.  He does endorse minimal diarrhea.  Patient is on his third day of paxlovid today.  Patient denies chest pain, radiation to left arm, neck, worsening SOB on exertion. Patient worried he has "long haul covid". Also endorses 1PPD smoking history, d/c tobacco in 2015.   Cough Associated symptoms: shortness of breath   Shortness of Breath Associated symptoms: cough       Past Medical History:  Diagnosis Date   Bipolar disorder ([place])    Diabetes mellitus    type 2   DVT (deep venous thrombosis) ([place]) 2006   leg after joint replacement    Dyspnea    with exertion occ   ED (erectile dysfunction)    Factor 5 Leiden mutation, heterozygous ([place])    Hepatitis    41 yrs ago hepatitis a   Hyperlipidemia    Orthostatic hypotension    Osteoarthritis    PE (pulmonary embolism) 01/27/2017   after right tha revision   PERSONAL HISTORY, VENOUS THROMBOSIS AND EMBOLISM    Prostate CA ([place]) 2012   treated with radiation   PROSTATE CANCER    Stroke ([place]) 15-20 yrs ago, saw on mri   TIA no residual from    Patient Active Problem List   Diagnosis Date Noted   Erectile dysfunction 07/18/2020   Failed total hip arthroplasty, 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   Benign essential HTN    DVT (deep venous thrombosis) ([place]) 11/14/2014   Diabetes mellitus type 2 in nonobese ([place]) 11/14/2014   Hypertension  11/14/2014   Concussion 09/03/2014   Closed head injury with concussion 08/31/2014   Syncope and collapse 04/13/2012   Orthostatic hypotension 04/13/2012   Dehydration 04/13/2012   Fall at home 04/13/2012   Hip pain, acute, right 04/13/2012   Alcohol abuse, episodic 12/19/2011   PROSTATE CANCER 09/02/2010   DIABETES, TYPE 2 09/02/2010   Hyperlipidemia 09/02/2010   PERSONAL HISTORY, VENOUS THROMBOSIS AND EMBOLISM 09/02/2010   TOBACCO ABUSE, HX OF 09/02/2010    Past Surgical History:  Procedure Laterality Date   CATARACT EXTRACTION Bilateral    colonoscopy     polyp located and tested negative   COLONOSCOPY WITH PROPOFOL  10/2003, [id number]   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   Procedure: PENILE PROTHESIS INFLATABLE;  Surgeon: Franchot Gallo, MD;  Location: [facility];  Service: Urology;  Laterality: N/A;   TONSILLECTOMY AND ADENOIDECTOMY     AS A CHILD   TOTAL HIP ARTHROPLASTY     Left and right hip replacements   TOTAL HIP REVISION Right 01/27/2017   Procedure: RIGHT TOTAL HIP REVISION;  Surgeon: Gaynelle Arabian, MD;  Location: [facility];  Service: Orthopedics;  Laterality: Right;       Family History  Problem Relation Age of Onset   Hyperlipidemia Mother    Hypertension Mother    Heart attack Father    Hyperlipidemia Father  Hypertension Father    Sudden death Father    Depression Sister     Social History   Tobacco Use   Smoking status: Former    Packs/day: 1.00    Years: 30.00    Pack years: 30.00    Types: Cigarettes    Quit date: 02/22/2014    Years since quitting: 7.6   Smokeless tobacco: Never  Vaping Use   Vaping Use: Never used  Substance Use Topics   Alcohol use: No   Drug use: No    Home Medications Prior to Admission medications   Medication Sig Start Date End Date Taking? Authorizing Provider  acetaminophen-codeine (TYLENOL #3) 300-30 MG tablet Take 1 tablet by mouth every 8 (eight) hours as needed for severe pain or  moderate pain. 10/14/21  Yes Varney Biles, MD  atorvastatin (LIPITOR) 80 MG tablet Take 80 mg by mouth at bedtime.    [provider]  HYDROcodone-acetaminophen (NORCO/VICODIN) 5-325 MG tablet Take 1 tablet by mouth every 4 (four) hours as needed for moderate pain. 07/19/20   Franchot Gallo, MD  lithium carbonate 300 MG capsule Take 300-600 mg by mouth See admin instructions. 300 mg in the morning and 600 mg in the evening    [provider]  metFORMIN (GLUCOPHAGE) 500 MG tablet Take 500 mg by mouth daily.     [provider]  methocarbamol (ROBAXIN) 500 MG tablet Take 1 tablet (500 mg total) by mouth every 6 (six) hours as needed for muscle spasms. 01/29/17   Perkins, Alexzandrew L, PA-C  ONE TOUCH ULTRA TEST test strip  04/29/17   [provider]  traZODone (DESYREL) 100 MG tablet Take 100 mg by mouth at bedtime.    [provider]  triamcinolone cream (KENALOG) 0.1 % Apply 1 application topically 2 (two) times daily.    [provider]  warfarin (COUMADIN) 5 MG tablet Take 5 mg by mouth See admin instructions. Takes 1 tablet (5 mg) on  All days of week except monday    [provider]  warfarin (COUMADIN) 7.5 MG tablet Take 1 tablet (7.5 mg total) by mouth one time only at 6 PM. Patient taking differently: Take 7.5 mg by mouth one time only at 6 PM. Takes 7.5 mg on Monday only 11/16/14   Barton Dubois, MD    Allergies    Altace [ramipril], Sulfonamide derivatives, and Oxycodone  Review of Systems   Review of Systems  Respiratory:  Positive for cough and shortness of breath.   All other systems reviewed and are negative.  Physical Exam Updated Vital Signs BP (!) 131/116   Pulse 63   Temp 97.7 F (36.5 C) (Oral)   Resp 10   SpO2 100%   Physical Exam Vitals and nursing note reviewed.  Constitutional:      General: He is not in acute distress.    Appearance: Normal appearance.     Comments: Overall well appearing  male with occasional dry sounding cough  HENT:     Head: Normocephalic and atraumatic.  Eyes:     General:        Right eye: No discharge.        Left eye: No discharge.  Cardiovascular:     Rate and Rhythm: Normal rate and regular rhythm.     Heart sounds: No murmur heard.   No friction rub. No gallop.  Pulmonary:     Effort: Pulmonary effort is normal.     Breath sounds: Normal breath  sounds.     Comments: No rhonchi, recent wheezing, stridor auscultated on exam.  Good respiratory effort throughout all lung fields. Abdominal:     General: Bowel sounds are normal.     Palpations: Abdomen is soft.  Skin:    General: Skin is warm and dry.     Capillary Refill: Capillary refill takes less than 2 seconds.  Neurological:     Mental Status: He is alert and oriented to person, place, and time.  Psychiatric:        Mood and Affect: Mood normal.        Behavior: Behavior normal.    ED Results / Procedures / Treatments   Labs (all labs ordered are listed, but only abnormal results are displayed) Labs Reviewed  CBC WITH DIFFERENTIAL/PLATELET - Abnormal; Notable for the following components:      Result Value   Platelets 147 (*)    All other components within normal limits  RESP PANEL BY RT-PCR (FLU A&B, COVID) ARPGX2  BASIC METABOLIC PANEL    EKG None  Radiology DG Chest 2 View  Result Date: 10/14/2021 CLINICAL DATA:  Cough and shortness of breath for the past 5 weeks. Recently diagnosed with COVID. EXAM: CHEST - 2 VIEW COMPARISON:  CT chest dated September 28, 2014. Chest x-ray dated October 24, 2012. FINDINGS: The heart size and mediastinal contours are within normal limits. Both lungs are clear. The visualized skeletal structures are unremarkable. IMPRESSION: No active cardiopulmonary disease. Electronically Signed   By: Titus Dubin M.D.   On: 10/14/2021 10:43    Procedures Procedures   Medications Ordered in ED Medications  guaiFENesin (ROBITUSSIN) 100 MG/5ML liquid  5 mL (has no administration in time range)  albuterol (VENTOLIN HFA) 108 (90 Base) MCG/ACT inhaler 2 puff (2 puffs Inhalation Given 10/14/21 1339)    ED Course  I have reviewed the triage vital signs and the nursing notes.  Pertinent labs & imaging results that were available during my care of the patient were reviewed by me and considered in my medical decision making (see chart for details).    MDM Rules/Calculators/A&P                         I discussed this case with my attending physician who cosigned this note including patient's presenting symptoms, physical exam, and planned diagnostics and interventions. Attending physician stated agreement with plan or made changes to plan which were implemented.   Attending physician assessed patient at bedside.  I discussed this case with my attending physician who cosigned this note including patient's presenting symptoms, physical exam, and planned diagnostics and interventions. Attending physician stated agreement with plan or made changes to plan which were implemented.   Overall well appearing male with known covid infection. Chronic cough without acute changes on chest x ray. May represent early COPD given chronic smoking history vs. Reactive airway changes in context of new URI on chronic lung changes.   Chest x ray without abnormality. CBC and BMP without significant changes. Patient discharged in stable condition, return precautions. Patient discharged with tylenol with codeine, given albuterol, robitussin prior to discharge. Final Clinical Impression(s) / ED Diagnoses Final diagnoses:  COVID-19  Chronic coughing    Rx / DC Orders ED Discharge Orders          Ordered    acetaminophen-codeine (TYLENOL #3) 300-30 MG tablet  Every 8 hours PRN        10/14/21 1334  Anselmo Pickler, PA-C 10/14/21 1340    Varney Biles, MD 10/18/21 2235

## 2021-10-14 NOTE — ED Triage Notes (Signed)
Pt c/o nagging cough X5 weeks. Given cough meds by pcp that does not help.   Pt reports testing positive for Covid-19 on Saturday.   C/o cough and shob.   Reports 02 level was 84% Ra this morning but unsure if it was due to cold hands.   Told to come in by PCP.    Pt takes blood thinners for hx of blood clots.   Pt currently taking Paxlovid X3 days.

## 2021-10-14 NOTE — Discharge Instructions (Addendum)
Finish your course of Paxlovid. Take albuterol every 4 hours or so for cough, shortness of breath. We are giving you Tylenol with codeine which she can take every 8-12 hours for cough. See your primary care doctor in 1 week.  You also have chronic cough, the work-up for which will require primary care doctor to run additional test to see if you have lung disease like COPD or GERD.  At this time it is too early to call this long COVID-19.

## 2021-10-20 DIAGNOSIS — R5381 Other malaise: Secondary | ICD-10-CM | POA: Diagnosis not present

## 2021-10-20 DIAGNOSIS — E785 Hyperlipidemia, unspecified: Secondary | ICD-10-CM | POA: Diagnosis not present

## 2021-10-20 DIAGNOSIS — Z7901 Long term (current) use of anticoagulants: Secondary | ICD-10-CM | POA: Diagnosis not present

## 2021-10-20 DIAGNOSIS — R0609 Other forms of dyspnea: Secondary | ICD-10-CM | POA: Diagnosis not present

## 2021-10-20 DIAGNOSIS — D6851 Activated protein C resistance: Secondary | ICD-10-CM | POA: Diagnosis not present

## 2021-10-24 ENCOUNTER — Telehealth: Payer: Self-pay | Admitting: Pulmonary Disease

## 2021-10-24 NOTE — Telephone Encounter (Signed)
JD pt has consult visit with you on 12/1.   He is wanting to know what kind of testing might be done at his appt.  He is planning on going out of the country on 12/15 and he is worried about RSV.  thanks

## 2021-10-24 NOTE — Telephone Encounter (Signed)
We will determine further testing at the time of consult. This will not likely affect his travels. The referral is for COPD so he will need pulmonary function testing.   [name] Hall      I have called and LM on VM for the pt to call our office back.

## 2021-10-29 DIAGNOSIS — U071 COVID-19: Secondary | ICD-10-CM | POA: Diagnosis not present

## 2021-10-29 DIAGNOSIS — I959 Hypotension, unspecified: Secondary | ICD-10-CM | POA: Diagnosis not present

## 2021-10-29 DIAGNOSIS — R059 Cough, unspecified: Secondary | ICD-10-CM | POA: Diagnosis not present

## 2021-10-30 ENCOUNTER — Encounter: Payer: Self-pay | Admitting: Pulmonary Disease

## 2021-10-30 ENCOUNTER — Ambulatory Visit (INDEPENDENT_AMBULATORY_CARE_PROVIDER_SITE_OTHER): Payer: No Typology Code available for payment source | Admitting: Pulmonary Disease

## 2021-10-30 ENCOUNTER — Other Ambulatory Visit: Payer: Self-pay

## 2021-10-30 VITALS — BP 118/72 | HR 71 | Ht 71.0 in | Wt 202.6 lb

## 2021-10-30 DIAGNOSIS — R0981 Nasal congestion: Secondary | ICD-10-CM | POA: Diagnosis not present

## 2021-10-30 DIAGNOSIS — R052 Subacute cough: Secondary | ICD-10-CM

## 2021-10-30 MED ORDER — FLUTICASONE FUROATE-VILANTEROL 100-25 MCG/ACT IN AEPB: 1.0000 | INHALATION_SPRAY | Freq: Every day | RESPIRATORY_TRACT | 3 refills | Status: DC

## 2021-10-30 MED ORDER — FLUTICASONE PROPIONATE 50 MCG/ACT NA SUSP
1.0000 | Freq: Every day | NASAL | 2 refills | Status: DC
Start: 2021-10-30 — End: 2022-08-25

## 2021-10-30 NOTE — Progress Notes (Signed)
Synopsis: Referred in December 2022 for COPD by Lawerance Cruel, MD  Subjective:   PATIENT ID: [name] Hall: male DOB: 10-21-1946, MRN: [id number]  HPI  Chief Complaint  Patient presents with   Consult    Referred by PCP for chronic cough and SOB for the past 8 weeks. Also tested positive for COVID in early November 2022.    [name] Hall is a 75 year old male, former smoker with hypertension, DMII, DVT/PE, and Factor V Leiden deficiency who is referred to pulmonary clinic for COPD.   He reports having cough for 6-8 weeks. He recently tested positive for covid 11/15 and again 11/30. He was started on augmentin by his PCP 11/30 and benzonatate. He started to use Zyrtec daily over the last month. He does not know if he has seasonal allergies.   The cough is intermittently productive. He has some sinus congestion and intermittent drainage. He does have some dyspnea with the cough. He denies heartburn.   He is a former smoker, undergoing lung cancer screening at the [place]. Has scan on 12/9 coming up. He was in the roofing business.   Past Medical History:  Diagnosis Date   Bipolar disorder ([place])    Diabetes mellitus    type 2   DVT (deep venous thrombosis) ([place]) 2006   leg after joint replacement    Dyspnea    with exertion occ   ED (erectile dysfunction)    Factor 5 Leiden mutation, heterozygous ([place])    Hepatitis    41 yrs ago hepatitis a   Hyperlipidemia    Orthostatic hypotension    Osteoarthritis    PE (pulmonary embolism) 01/27/2017   after right tha revision   PERSONAL HISTORY, VENOUS THROMBOSIS AND EMBOLISM    Prostate CA ([name]) 2012   treated with radiation   PROSTATE CANCER    Stroke ([place]) 15-20 yrs ago, saw on mri   TIA no residual from     Family History  Problem Relation Age of Onset   Hyperlipidemia Mother    Hypertension Mother    Heart attack Father    Hyperlipidemia Father    Hypertension Father    Sudden death Father    Depression  Sister      Social History   Socioeconomic History   Marital status: Married    Spouse name: Not on file   Number of children: Not on file   Years of education: Not on file   Highest education level: Not on file  Occupational History   Not on file  Tobacco Use   Smoking status: Former    Packs/day: 1.00    Years: 30.00    Pack years: 30.00    Types: Cigarettes    Quit date: 02/22/2014    Years since quitting: 7.6   Smokeless tobacco: Never  Vaping Use   Vaping Use: Never used  Substance and Sexual Activity   Alcohol use: No   Drug use: No   Sexual activity: Never  Other Topics Concern   Not on file  Social History Narrative   Not on file   Social Determinants of Health   Financial Resource Strain: Not on file  Food Insecurity: Not on file  Transportation Needs: Not on file  Physical Activity: Not on file  Stress: Not on file  Social Connections: Not on file  Intimate Partner Violence: Not on file     Allergies  Allergen Reactions   Altace [Ramipril] Hives   Sulfonamide  Derivatives Hives and Itching   Oxycodone Itching and Rash     Outpatient Medications Prior to Visit  Medication Sig Dispense Refill   acetaminophen (TYLENOL) 650 MG CR tablet Take 650 mg by mouth 2 (two) times daily.     amoxicillin-clavulanate (AUGMENTIN) 875-125 MG tablet 1 tablet     atorvastatin (LIPITOR) 80 MG tablet Take 80 mg by mouth at bedtime.     benzonatate (TESSALON) 100 MG capsule 2 capsules     desmopressin (DDAVP) 0.2 MG tablet Take 1 tablet by mouth at bedtime.     lithium carbonate 300 MG capsule Take 300-600 mg by mouth See admin instructions. 300 mg in the morning and 600 mg in the evening     metFORMIN (GLUCOPHAGE) 500 MG tablet Take 500 mg by mouth daily.      ONE TOUCH ULTRA TEST test strip      traZODone (DESYREL) 100 MG tablet Take 100 mg by mouth at bedtime.     triamcinolone cream (KENALOG) 0.1 % Apply 1 application topically 2 (two) times daily.     warfarin  (COUMADIN) 5 MG tablet Take 5 mg by mouth See admin instructions. Takes 1 tablet (5 mg) on  All days of week except monday     warfarin (COUMADIN) 7.5 MG tablet Take 1 tablet (7.5 mg total) by mouth one time only at 6 PM. (Patient taking differently: Take 7.5 mg by mouth one time only at 6 PM. Takes 7.5 mg on Monday only) 20 tablet 0   HYDROcodone-acetaminophen (NORCO/VICODIN) 5-325 MG tablet Take 1 tablet by mouth every 4 (four) hours as needed for moderate pain. 15 tablet 0   methocarbamol (ROBAXIN) 500 MG tablet Take 1 tablet (500 mg total) by mouth every 6 (six) hours as needed for muscle spasms. 80 tablet 0   acetaminophen-codeine (TYLENOL #3) 300-30 MG tablet Take 1 tablet by mouth every 8 (eight) hours as needed for severe pain or moderate pain. 10 tablet 0   metFORMIN (GLUCOPHAGE) 500 MG tablet 1 tablet with a meal     No facility-administered medications prior to visit.   Review of Systems  Constitutional:  Negative for chills, fever, malaise/fatigue and weight loss.  HENT:  Negative for congestion, sinus pain and sore throat.   Eyes: Negative.   Respiratory:  Positive for cough, sputum production and shortness of breath. Negative for hemoptysis and wheezing.   Cardiovascular:  Negative for chest pain, palpitations, orthopnea, claudication and leg swelling.  Gastrointestinal:  Negative for abdominal pain, heartburn, nausea and vomiting.  Genitourinary: Negative.   Musculoskeletal:  Negative for joint pain and myalgias.  Skin:  Negative for rash.  Neurological:  Negative for weakness.  Endo/Heme/Allergies: Negative.   Psychiatric/Behavioral: Negative.     Objective:   Vitals:   10/30/21 1109  BP: 118/72  Pulse: 71  SpO2: 99%  Weight: 202 lb 9.6 oz (91.9 kg)  Height: 5\' 11"  (1.803 m)   Physical Exam Constitutional:      General: He is not in acute distress. HENT:     Head: Normocephalic and atraumatic.  Eyes:     Extraocular Movements: Extraocular movements intact.      Conjunctiva/sclera: Conjunctivae normal.     Pupils: Pupils are equal, round, and reactive to light.  Cardiovascular:     Rate and Rhythm: Normal rate and regular rhythm.     Pulses: Normal pulses.     Heart sounds: Normal heart sounds. No murmur heard. Pulmonary:     Breath sounds:  Rales (mild, left base) present.  Abdominal:     General: Bowel sounds are normal.     Palpations: Abdomen is soft.  Musculoskeletal:     Right lower leg: No edema.     Left lower leg: No edema.  Lymphadenopathy:     Cervical: No cervical adenopathy.  Skin:    General: Skin is warm and dry.  Neurological:     General: No focal deficit present.     Mental Status: He is alert.  Psychiatric:        Mood and Affect: Mood normal.        Behavior: Behavior normal.        Thought Content: Thought content normal.        Judgment: Judgment normal.   CBC    Component Value Date/Time   WBC 5.1 10/14/2021 1148   RBC 4.45 10/14/2021 1148   HGB 13.5 10/14/2021 1148   HCT 42.0 10/14/2021 1148   PLT 147 (L) 10/14/2021 1148   MCV 94.4 10/14/2021 1148   MCH 30.3 10/14/2021 1148   MCHC 32.1 10/14/2021 1148   RDW 14.1 10/14/2021 1148   LYMPHSABS 1.3 10/14/2021 1148   MONOABS 0.8 10/14/2021 1148   EOSABS 0.2 10/14/2021 1148   BASOSABS 0.0 10/14/2021 1148   BMP Latest Ref Rng & Units 10/14/2021 07/18/2020 01/29/2017  Glucose 70 - 99 mg/dL 86 114(H) 123(H)  BUN 8 - 23 mg/dL 14 18 17   Creatinine 0.61 - 1.24 mg/dL 1.08 1.00 0.89  Sodium 135 - 145 mmol/L 139 142 137  Potassium 3.5 - 5.1 mmol/L 4.5 4.2 4.8  Chloride 98 - 111 mmol/L 108 108 105  CO2 22 - 32 mmol/L 25 - 29  Calcium 8.9 - 10.3 mg/dL 9.3 - 9.0   Chest imaging: CXR 10/14/21 The heart size and mediastinal contours are within normal limits. Both lungs are clear. The visualized skeletal structures are unremarkable.  PFT: No flowsheet data found.  Labs: 10/29/21 Cr 1.4 Hgb 12 Sars-CoV2 positive  Path:  Echo:  Heart  Catheterization:  Assessment & Plan:   Subacute cough - Plan: fluticasone furoate-vilanterol (BREO ELLIPTA) 100-25 MCG/ACT AEPB, Pulmonary Function Test  Sinus congestion - Plan: fluticasone (FLONASE) 50 MCG/ACT nasal spray  Discussion: [name] Hall is a 75 year old male, former smoker with hypertension, DMII, DVT/PE, and Factor V Leiden deficiency who is referred to pulmonary clinic for COPD.   He has subacute cough over recent weeks with ongoing positive covid tests. His cough could be secondary to seasonal allergies vs covid infection vs post-nasal drainage vs obstructive lung disease.   Chest radiograph 10/14/21 is unremarkable.  We will start him on ICS/LABA therapy with Breo ellipta 1 puff daily. He is to also continue zyrtec for possible allergies. He is to start fluticasone nasal spray for post nasal drainage.   He was started on augmentin yesterday by his primary care team.   Follow up in 6 weeks with pulmonary function tests.  [name] Jackson, MD [facility] Office: [phone]    Current Outpatient Medications:    acetaminophen (TYLENOL) 650 MG CR tablet, Take 650 mg by mouth 2 (two) times daily., Disp: , Rfl:    amoxicillin-clavulanate (AUGMENTIN) 875-125 MG tablet, 1 tablet, Disp: , Rfl:    atorvastatin (LIPITOR) 80 MG tablet, Take 80 mg by mouth at bedtime., Disp: , Rfl:    benzonatate (TESSALON) 100 MG capsule, 2 capsules, Disp: , Rfl:    desmopressin (DDAVP) 0.2 MG tablet, Take 1  tablet by mouth at bedtime., Disp: , Rfl:    fluticasone (FLONASE) 50 MCG/ACT nasal spray, Place 1 spray into both nostrils daily., Disp: 16 g, Rfl: 2   fluticasone furoate-vilanterol (BREO ELLIPTA) 100-25 MCG/ACT AEPB, Inhale 1 puff into the lungs daily., Disp: 28 each, Rfl: 3   lithium carbonate 300 MG capsule, Take 300-600 mg by mouth See admin instructions. 300 mg in the morning and 600 mg in the evening, Disp: , Rfl:    metFORMIN (GLUCOPHAGE) 500 MG tablet,  Take 500 mg by mouth daily. , Disp: , Rfl:    ONE TOUCH ULTRA TEST test strip, , Disp: , Rfl:    traZODone (DESYREL) 100 MG tablet, Take 100 mg by mouth at bedtime., Disp: , Rfl:    triamcinolone cream (KENALOG) 0.1 %, Apply 1 application topically 2 (two) times daily., Disp: , Rfl:    warfarin (COUMADIN) 5 MG tablet, Take 5 mg by mouth See admin instructions. Takes 1 tablet (5 mg) on  All days of week except monday, Disp: , Rfl:    warfarin (COUMADIN) 7.5 MG tablet, Take 1 tablet (7.5 mg total) by mouth one time only at 6 PM. (Patient taking differently: Take 7.5 mg by mouth one time only at 6 PM. Takes 7.5 mg on Monday only), Disp: 20 tablet, Rfl: 0

## 2021-10-30 NOTE — Patient Instructions (Signed)
Start Breo ellipta 1 puff daily - rinse mouth out after each use  Start fluticasone nasal spray, 1 spray per nostril before bedtime  Complete course of Augmentin  Continue Zyrtec

## 2021-11-06 DIAGNOSIS — Z7901 Long term (current) use of anticoagulants: Secondary | ICD-10-CM | POA: Diagnosis not present

## 2021-11-06 DIAGNOSIS — D696 Thrombocytopenia, unspecified: Secondary | ICD-10-CM | POA: Diagnosis not present

## 2021-11-06 DIAGNOSIS — E1169 Type 2 diabetes mellitus with other specified complication: Secondary | ICD-10-CM | POA: Diagnosis not present

## 2021-11-06 DIAGNOSIS — Z96642 Presence of left artificial hip joint: Secondary | ICD-10-CM | POA: Diagnosis not present

## 2021-11-06 DIAGNOSIS — R531 Weakness: Secondary | ICD-10-CM | POA: Diagnosis not present

## 2021-11-06 DIAGNOSIS — D6851 Activated protein C resistance: Secondary | ICD-10-CM | POA: Diagnosis not present

## 2021-11-06 DIAGNOSIS — R413 Other amnesia: Secondary | ICD-10-CM | POA: Diagnosis not present

## 2021-11-12 DIAGNOSIS — G4700 Insomnia, unspecified: Secondary | ICD-10-CM | POA: Diagnosis not present

## 2021-11-12 DIAGNOSIS — N1831 Chronic kidney disease, stage 3a: Secondary | ICD-10-CM | POA: Diagnosis not present

## 2021-11-12 DIAGNOSIS — E1169 Type 2 diabetes mellitus with other specified complication: Secondary | ICD-10-CM | POA: Diagnosis not present

## 2021-12-10 DIAGNOSIS — N1831 Chronic kidney disease, stage 3a: Secondary | ICD-10-CM | POA: Diagnosis not present

## 2021-12-10 DIAGNOSIS — R059 Cough, unspecified: Secondary | ICD-10-CM | POA: Diagnosis not present

## 2021-12-24 ENCOUNTER — Ambulatory Visit: Payer: Non-veteran care | Admitting: Pulmonary Disease

## 2021-12-24 DIAGNOSIS — I8392 Asymptomatic varicose veins of left lower extremity: Secondary | ICD-10-CM | POA: Diagnosis not present

## 2021-12-24 DIAGNOSIS — D692 Other nonthrombocytopenic purpura: Secondary | ICD-10-CM | POA: Diagnosis not present

## 2021-12-24 DIAGNOSIS — I8312 Varicose veins of left lower extremity with inflammation: Secondary | ICD-10-CM | POA: Diagnosis not present

## 2021-12-24 DIAGNOSIS — D225 Melanocytic nevi of trunk: Secondary | ICD-10-CM | POA: Diagnosis not present

## 2021-12-24 DIAGNOSIS — L821 Other seborrheic keratosis: Secondary | ICD-10-CM | POA: Diagnosis not present

## 2021-12-24 DIAGNOSIS — L820 Inflamed seborrheic keratosis: Secondary | ICD-10-CM | POA: Diagnosis not present

## 2021-12-24 DIAGNOSIS — L570 Actinic keratosis: Secondary | ICD-10-CM | POA: Diagnosis not present

## 2021-12-24 DIAGNOSIS — I872 Venous insufficiency (chronic) (peripheral): Secondary | ICD-10-CM | POA: Diagnosis not present

## 2021-12-24 DIAGNOSIS — I8311 Varicose veins of right lower extremity with inflammation: Secondary | ICD-10-CM | POA: Diagnosis not present

## 2021-12-30 ENCOUNTER — Other Ambulatory Visit: Payer: Self-pay

## 2021-12-30 ENCOUNTER — Ambulatory Visit: Payer: Medicare Other | Admitting: Pulmonary Disease

## 2021-12-30 ENCOUNTER — Encounter: Payer: Self-pay | Admitting: Pulmonary Disease

## 2021-12-30 VITALS — BP 114/70 | HR 79 | Ht 71.0 in | Wt 200.6 lb

## 2021-12-30 DIAGNOSIS — J432 Centrilobular emphysema: Secondary | ICD-10-CM | POA: Diagnosis not present

## 2021-12-30 DIAGNOSIS — R052 Subacute cough: Secondary | ICD-10-CM

## 2021-12-30 MED ORDER — FLUTICASONE FUROATE-VILANTEROL 100-25 MCG/ACT IN AEPB: 1.0000 | INHALATION_SPRAY | Freq: Every day | RESPIRATORY_TRACT | 6 refills | Status: DC

## 2021-12-30 NOTE — Patient Instructions (Addendum)
We will request a report from the [place] of your recent CT Chest scan  Resume Breo Ellipta 1 puff daily for your emphysema and cough  Continue fluticasone nasal spray daily and zyrtec for sinus congestion and post nasal drainage.   Follow up in 6 months with PFTs

## 2021-12-30 NOTE — Progress Notes (Signed)
Synopsis: Referred in December 2022 for COPD by Lawerance Cruel, MD  Subjective:   PATIENT ID: [name] Hall: male DOB: 1946-01-28, MRN: [id number]  HPI  Chief Complaint  Patient presents with   Follow-up    6wk f/u for cough. States he still has a productive cough with thick, white phlegm.    Frantz Quattrone is a 76 year old male, former smoker with hypertension, DMII, DVT/PE, and Factor V Leiden deficiency who returns to pulmonary clinic for COPD follow up.   He was started on breo ellipta 100-43mcg 1 puff daily for concern of obstructive lung disease which he took for 3 weeks and the cough subsided. He is not taking the breo now. He is taking zyrtec plus fluticasone for post-nasal drainage. He is currently having very little sinus congestion and drainage now. He is mainly coughing in the morning time, clear phlegm. Experiencing dyspnea on exertion.   He was given an inhaler by his PCP but says this was too harsh.   OV 10/30/21 He reports having cough for 6-8 weeks. He recently tested positive for covid 11/15 and again 11/30. He was started on augmentin by his PCP 11/30 and benzonatate. He started to use Zyrtec daily over the last month. He does not know if he has seasonal allergies.   The cough is intermittently productive. He has some sinus congestion and intermittent drainage. He does have some dyspnea with the cough. He denies heartburn.   He is a former smoker, undergoing lung cancer screening at the [place]. Has scan on 12/9 coming up. He was in the roofing business. Has agent orange exposure.   Past Medical History:  Diagnosis Date   Bipolar disorder ([place])    Diabetes mellitus    type 2   DVT (deep venous thrombosis) ([place]) 2006   leg after joint replacement    Dyspnea    with exertion occ   ED (erectile dysfunction)    Factor 5 Leiden mutation, heterozygous ([place])    Hepatitis    41 yrs ago hepatitis a   Hyperlipidemia    Orthostatic hypotension     Osteoarthritis    PE (pulmonary embolism) 01/27/2017   after right tha revision   PERSONAL HISTORY, VENOUS THROMBOSIS AND EMBOLISM    Prostate CA ([place]) 2012   treated with radiation   PROSTATE CANCER    Stroke ([place]) 15-20 yrs ago, saw on mri   TIA no residual from     Family History  Problem Relation Age of Onset   Hyperlipidemia Mother    Hypertension Mother    Heart attack Father    Hyperlipidemia Father    Hypertension Father    Sudden death Father    Depression Sister      Social History   Socioeconomic History   Marital status: Married    Spouse name: Not on file   Number of children: Not on file   Years of education: Not on file   Highest education level: Not on file  Occupational History   Not on file  Tobacco Use   Smoking status: Former    Packs/day: 1.00    Years: 30.00    Pack years: 30.00    Types: Cigarettes    Quit date: 02/22/2014    Years since quitting: 7.8   Smokeless tobacco: Never  Vaping Use   Vaping Use: Never used  Substance and Sexual Activity   Alcohol use: No   Drug use: No   Sexual activity:  Never  Other Topics Concern   Not on file  Social History Narrative   Not on file   Social Determinants of Health   Financial Resource Strain: Not on file  Food Insecurity: Not on file  Transportation Needs: Not on file  Physical Activity: Not on file  Stress: Not on file  Social Connections: Not on file  Intimate Partner Violence: Not on file     Allergies  Allergen Reactions   Altace [Ramipril] Hives   Sulfonamide Derivatives Hives and Itching   Oxycodone Itching and Rash     Outpatient Medications Prior to Visit  Medication Sig Dispense Refill   acetaminophen (TYLENOL) 650 MG CR tablet Take 650 mg by mouth 2 (two) times daily.     amoxicillin-clavulanate (AUGMENTIN) 875-125 MG tablet 1 tablet     atorvastatin (LIPITOR) 80 MG tablet Take 80 mg by mouth at bedtime.     benzonatate (TESSALON) 100 MG capsule 2 capsules      desmopressin (DDAVP) 0.2 MG tablet Take 1 tablet by mouth at bedtime.     fluticasone (FLONASE) 50 MCG/ACT nasal spray Place 1 spray into both nostrils daily. 16 g 2   fluticasone furoate-vilanterol (BREO ELLIPTA) 100-25 MCG/ACT AEPB Inhale 1 puff into the lungs daily. 28 each 3   lithium carbonate 300 MG capsule Take 300-600 mg by mouth See admin instructions. 300 mg in the morning and 600 mg in the evening     metFORMIN (GLUCOPHAGE) 500 MG tablet Take 500 mg by mouth daily.      ONE TOUCH ULTRA TEST test strip      traZODone (DESYREL) 100 MG tablet Take 100 mg by mouth at bedtime.     triamcinolone cream (KENALOG) 0.1 % Apply 1 application topically 2 (two) times daily.     warfarin (COUMADIN) 5 MG tablet Take 5 mg by mouth See admin instructions. Takes 1 tablet (5 mg) on  All days of week except monday     warfarin (COUMADIN) 7.5 MG tablet Take 1 tablet (7.5 mg total) by mouth one time only at 6 PM. (Patient taking differently: Take 7.5 mg by mouth one time only at 6 PM. Takes 7.5 mg on Monday only) 20 tablet 0   No facility-administered medications prior to visit.   Review of Systems  Constitutional:  Negative for chills, fever, malaise/fatigue and weight loss.  HENT:  Negative for congestion, sinus pain and sore throat.   Eyes: Negative.   Respiratory:  Positive for cough, sputum production and shortness of breath. Negative for hemoptysis and wheezing.   Cardiovascular:  Negative for chest pain, palpitations, orthopnea, claudication and leg swelling.  Gastrointestinal:  Negative for abdominal pain, heartburn, nausea and vomiting.  Genitourinary: Negative.   Musculoskeletal:  Negative for joint pain and myalgias.  Skin:  Negative for rash.  Neurological:  Negative for weakness.  Endo/Heme/Allergies: Negative.   Psychiatric/Behavioral: Negative.     Objective:   Vitals:   12/30/21 0904  BP: 114/70  Pulse: 79  SpO2: 99%  Weight: 200 lb 9.6 oz (91 kg)  Height: 5\' 11"  (1.803 m)    Physical Exam Constitutional:      General: He is not in acute distress. HENT:     Head: Normocephalic and atraumatic.  Eyes:     Extraocular Movements: Extraocular movements intact.     Conjunctiva/sclera: Conjunctivae normal.     Pupils: Pupils are equal, round, and reactive to light.  Cardiovascular:     Rate and Rhythm: Normal rate  and regular rhythm.     Pulses: Normal pulses.     Heart sounds: Normal heart sounds. No murmur heard. Pulmonary:     Breath sounds: Rales (mild, left base) present.  Abdominal:     General: Bowel sounds are normal.     Palpations: Abdomen is soft.  Musculoskeletal:     Right lower leg: No edema.     Left lower leg: No edema.  Lymphadenopathy:     Cervical: No cervical adenopathy.  Skin:    General: Skin is warm and dry.  Neurological:     General: No focal deficit present.     Mental Status: He is alert.  Psychiatric:        Mood and Affect: Mood normal.        Behavior: Behavior normal.        Thought Content: Thought content normal.        Judgment: Judgment normal.   CBC    Component Value Date/Time   WBC 5.1 10/14/2021 1148   RBC 4.45 10/14/2021 1148   HGB 13.5 10/14/2021 1148   HCT 42.0 10/14/2021 1148   PLT 147 (L) 10/14/2021 1148   MCV 94.4 10/14/2021 1148   MCH 30.3 10/14/2021 1148   MCHC 32.1 10/14/2021 1148   RDW 14.1 10/14/2021 1148   LYMPHSABS 1.3 10/14/2021 1148   MONOABS 0.8 10/14/2021 1148   EOSABS 0.2 10/14/2021 1148   BASOSABS 0.0 10/14/2021 1148   BMP Latest Ref Rng & Units 10/14/2021 07/18/2020 01/29/2017  Glucose 70 - 99 mg/dL 86 114(H) 123(H)  BUN 8 - 23 mg/dL 14 18 17   Creatinine 0.61 - 1.24 mg/dL 1.08 1.00 0.89  Sodium 135 - 145 mmol/L 139 142 137  Potassium 3.5 - 5.1 mmol/L 4.5 4.2 4.8  Chloride 98 - 111 mmol/L 108 108 105  CO2 22 - 32 mmol/L 25 - 29  Calcium 8.9 - 10.3 mg/dL 9.3 - 9.0   Chest imaging: CXR 10/14/21 The heart size and mediastinal contours are within normal limits. Both lungs are  clear. The visualized skeletal structures are unremarkable.  PFT: No flowsheet data found.  Labs: 10/29/21 Cr 1.4 Hgb 12 Sars-CoV2 positive  Path:  Echo:  Heart Catheterization:  Assessment & Plan:   Centrilobular emphysema ([place])  Discussion: Shaurya Rawdon is a 76 year old male, former smoker with hypertension, DMII, DVT/PE, and Factor V Leiden deficiency who returns to pulmonary clinic for COPD follow up.   He has centrilobular emphysema as he reports from CT Chest scan on 12/23/21 at the [place]. We will request a copy of the report.   He is to resume breo ellipta 1 puff daily. He is to continue zyrtec for possible allergies. He is to continue fluticasone nasal spray for post nasal drainage.   Follow up in 6 months with pulmonary function tests.  Freda Jackson, MD [facility] Office: [phone]    Current Outpatient Medications:    acetaminophen (TYLENOL) 650 MG CR tablet, Take 650 mg by mouth 2 (two) times daily., Disp: , Rfl:    amoxicillin-clavulanate (AUGMENTIN) 875-125 MG tablet, 1 tablet, Disp: , Rfl:    atorvastatin (LIPITOR) 80 MG tablet, Take 80 mg by mouth at bedtime., Disp: , Rfl:    benzonatate (TESSALON) 100 MG capsule, 2 capsules, Disp: , Rfl:    desmopressin (DDAVP) 0.2 MG tablet, Take 1 tablet by mouth at bedtime., Disp: , Rfl:    fluticasone (FLONASE) 50 MCG/ACT nasal spray, Place 1 spray into both  nostrils daily., Disp: 16 g, Rfl: 2   fluticasone furoate-vilanterol (BREO ELLIPTA) 100-25 MCG/ACT AEPB, Inhale 1 puff into the lungs daily., Disp: 28 each, Rfl: 3   lithium carbonate 300 MG capsule, Take 300-600 mg by mouth See admin instructions. 300 mg in the morning and 600 mg in the evening, Disp: , Rfl:    metFORMIN (GLUCOPHAGE) 500 MG tablet, Take 500 mg by mouth daily. , Disp: , Rfl:    ONE TOUCH ULTRA TEST test strip, , Disp: , Rfl:    traZODone (DESYREL) 100 MG tablet, Take 100 mg by mouth at bedtime., Disp: , Rfl:     triamcinolone cream (KENALOG) 0.1 %, Apply 1 application topically 2 (two) times daily., Disp: , Rfl:    warfarin (COUMADIN) 5 MG tablet, Take 5 mg by mouth See admin instructions. Takes 1 tablet (5 mg) on  All days of week except monday, Disp: , Rfl:    warfarin (COUMADIN) 7.5 MG tablet, Take 1 tablet (7.5 mg total) by mouth one time only at 6 PM. (Patient taking differently: Take 7.5 mg by mouth one time only at 6 PM. Takes 7.5 mg on Monday only), Disp: 20 tablet, Rfl: 0

## 2022-02-02 DIAGNOSIS — R0781 Pleurodynia: Secondary | ICD-10-CM | POA: Diagnosis not present

## 2022-03-31 DIAGNOSIS — H1033 Unspecified acute conjunctivitis, bilateral: Secondary | ICD-10-CM | POA: Diagnosis not present

## 2022-04-03 DIAGNOSIS — Z7901 Long term (current) use of anticoagulants: Secondary | ICD-10-CM | POA: Diagnosis not present

## 2022-04-03 DIAGNOSIS — I2510 Atherosclerotic heart disease of native coronary artery without angina pectoris: Secondary | ICD-10-CM | POA: Diagnosis not present

## 2022-04-03 DIAGNOSIS — D696 Thrombocytopenia, unspecified: Secondary | ICD-10-CM | POA: Diagnosis not present

## 2022-04-03 DIAGNOSIS — Z79899 Other long term (current) drug therapy: Secondary | ICD-10-CM | POA: Diagnosis not present

## 2022-04-03 DIAGNOSIS — E1169 Type 2 diabetes mellitus with other specified complication: Secondary | ICD-10-CM | POA: Diagnosis not present

## 2022-04-03 DIAGNOSIS — E785 Hyperlipidemia, unspecified: Secondary | ICD-10-CM | POA: Diagnosis not present

## 2022-04-07 DIAGNOSIS — I82409 Acute embolism and thrombosis of unspecified deep veins of unspecified lower extremity: Secondary | ICD-10-CM | POA: Diagnosis not present

## 2022-04-07 DIAGNOSIS — E1149 Type 2 diabetes mellitus with other diabetic neurological complication: Secondary | ICD-10-CM | POA: Diagnosis not present

## 2022-04-07 DIAGNOSIS — I10 Essential (primary) hypertension: Secondary | ICD-10-CM | POA: Diagnosis not present

## 2022-04-07 DIAGNOSIS — E785 Hyperlipidemia, unspecified: Secondary | ICD-10-CM | POA: Diagnosis not present

## 2022-04-07 DIAGNOSIS — D6851 Activated protein C resistance: Secondary | ICD-10-CM | POA: Diagnosis not present

## 2022-04-07 DIAGNOSIS — Z0000 Encounter for general adult medical examination without abnormal findings: Secondary | ICD-10-CM | POA: Diagnosis not present

## 2022-04-07 DIAGNOSIS — D696 Thrombocytopenia, unspecified: Secondary | ICD-10-CM | POA: Diagnosis not present

## 2022-04-07 DIAGNOSIS — G4700 Insomnia, unspecified: Secondary | ICD-10-CM | POA: Diagnosis not present

## 2022-05-15 DIAGNOSIS — Z7901 Long term (current) use of anticoagulants: Secondary | ICD-10-CM | POA: Diagnosis not present

## 2022-07-08 DIAGNOSIS — Z7901 Long term (current) use of anticoagulants: Secondary | ICD-10-CM | POA: Diagnosis not present

## 2022-08-16 DIAGNOSIS — Z20822 Contact with and (suspected) exposure to covid-19: Secondary | ICD-10-CM | POA: Diagnosis not present

## 2022-08-16 DIAGNOSIS — I9589 Other hypotension: Secondary | ICD-10-CM | POA: Diagnosis not present

## 2022-08-16 DIAGNOSIS — R5383 Other fatigue: Secondary | ICD-10-CM | POA: Diagnosis not present

## 2022-08-16 DIAGNOSIS — R051 Acute cough: Secondary | ICD-10-CM | POA: Diagnosis not present

## 2022-08-17 NOTE — Progress Notes (Unsigned)
[facility] [place]  ASSESSMENT / PLAN: [name] Hall is a 76 y.o. 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  The Joint Council of the [place] for Vascular [employer] and Society for Vascular Surgery estimates annual rupture risk based on abdominal aneurysm diameter. The patient's estimated risk is {aneurysmrupture:24841}  The patient is *** a candidate for elective repair of the aneurysm to prevent rupture.  I explained the risks / benefits / alternatives to different approaches to aortic reconstruction.   I explained the specific benefits of open repair including improved durability, limited requirement for surveillance, less need for secondary intervention. I explained the specific risks from open repair including higher physiologic stress from aortic cross clamping, higher risk of perioperative complication (including stroke, MI, pneumonia, renal insufficiency and failure, etc.), higher risk of abdominal wall complications, risk of anastomotic pseudoaneurysm.  I explained the specific benefits of endovascular repair including limited physiologic stress and less recovery time, lower risk of serious perioperative complication, zero risk of abdominal wall complication.  I explained the specific risks from endovascular repair including need for lifetime surveillance, risk of large-bore arterial access, risk of requiring secondary intervention to maintain seal or patency. I explained that not all patients are candidates for endovascular repair based on their unique anatomy.  After detailed discussion the patient and I agree that the best option for the patient is ***.   Recommend the following to reduce the risk of major adverse cardiac / limb events.  Complete cessation from all tobacco products. Blood glucose control with goal A1c < 7%. Blood pressure control with goal blood pressure < 140/90 mmHg. Lipid reduction therapy with goal LDL-C <100  mg/dL. Aspirin '81mg'$  PO QD.  Atorvastatin 40-'80mg'$  PO QD (or other "high intensity" statin therapy). *** The addition of ezetimibe or PCSK9 inhibitors may benefit patients with difficult to control hypercholesterolemia.    CHIEF COMPLAINT: ***  HISTORY OF PRESENT ILLNESS: [name] Hall is a 76 y.o. male ***  VASCULAR SURGICAL HISTORY: ***  VASCULAR RISK FACTORS: {FINDINGS; POSITIVE NEGATIVE:631 130 4971} history of stroke / transient ischemic attack. {FINDINGS; POSITIVE NEGATIVE:631 130 4971} history of coronary artery disease. *** history of PCI. *** history of CABG.  {FINDINGS; POSITIVE NEGATIVE:631 130 4971} history of diabetes mellitus. Last A1c ***. {FINDINGS; POSITIVE NEGATIVE:631 130 4971} history of smoking. *** actively smoking. {FINDINGS; POSITIVE NEGATIVE:631 130 4971} history of hypertension. *** drug regimen with *** control. {FINDINGS; POSITIVE NEGATIVE:631 130 4971} history of chronic kidney disease.  Last GFR ***. CKD {stage:30421363}. {FINDINGS; POSITIVE NEGATIVE:631 130 4971} history of chronic obstructive pulmonary disease, treated with ***.  FUNCTIONAL STATUS: ECOG performance status: {findings; ecog performance status:31780} Ambulatory status: {TNHAmbulation:25868}  [name] (2pts) 75-79 or 80-84 (2pts) >84 (3pts) Dependence in toileting (1pt) Partial or full dependence in dressing (1pt) History of malignant neoplasm (2pts) CHF (3pts) COPD (1pts) CKD (3pts)  0-3 pts 6% 1 year mortality ; 21% 3 year mortality 4-5 pts 12% 1 year mortality ; 36% 3 year mortality >5 pts 21% 1 year mortality; 54% 3 year mortality   Past Medical History:  Diagnosis Date   Bipolar disorder ([place])    Diabetes mellitus    type 2   DVT (deep venous thrombosis) ([place]) 2006   leg after joint replacement    Dyspnea    with exertion occ   ED (erectile dysfunction)    Factor 5 Leiden mutation, heterozygous ([place])    Hepatitis    41 yrs ago hepatitis a    Hyperlipidemia    Orthostatic hypotension  Osteoarthritis    PE (pulmonary embolism) 01/27/2017   after right tha revision   PERSONAL HISTORY, VENOUS THROMBOSIS AND EMBOLISM    Prostate CA ([place]) 2012   treated with radiation   PROSTATE CANCER    Stroke ([place]) 15-20 yrs ago, saw on mri   TIA no residual from    Past Surgical History:  Procedure Laterality Date   CATARACT EXTRACTION Bilateral    colonoscopy     polyp located and tested negative   COLONOSCOPY WITH PROPOFOL  10/2003, 9767,3419   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   Procedure: PENILE PROTHESIS INFLATABLE;  Surgeon: Franchot Gallo, MD;  Location: [facility];  Service: Urology;  Laterality: N/A;   TONSILLECTOMY AND ADENOIDECTOMY     AS A CHILD   TOTAL HIP ARTHROPLASTY     Left and right hip replacements   TOTAL HIP REVISION Right 01/27/2017   Procedure: RIGHT TOTAL HIP REVISION;  Surgeon: Gaynelle Arabian, MD;  Location: [facility];  Service: Orthopedics;  Laterality: Right;    Family History  Problem Relation Age of Onset   Hyperlipidemia Mother    Hypertension Mother    Heart attack Father    Hyperlipidemia Father    Hypertension Father    Sudden death Father    Depression Sister     Social History   Socioeconomic History   Marital status: Married    Spouse name: Not on file   Number of children: Not on file   Years of education: Not on file   Highest education level: Not on file  Occupational History   Not on file  Tobacco Use   Smoking status: Former    Packs/day: 1.00    Years: 30.00    Total pack years: 30.00    Types: Cigarettes    Quit date: 02/22/2014    Years since quitting: 8.4   Smokeless tobacco: Never  Vaping Use   Vaping Use: Never used  Substance and Sexual Activity   Alcohol use: No   Drug use: No   Sexual activity: Never  Other Topics Concern   Not on file  Social History Narrative   Not on file   Social Determinants of Health   Financial Resource  Strain: Not on file  Food Insecurity: Not on file  Transportation Needs: Not on file  Physical Activity: Not on file  Stress: Not on file  Social Connections: Not on file  Intimate Partner Violence: Not on file    Allergies  Allergen Reactions   Altace [Ramipril] Hives   Sulfonamide Derivatives Hives and Itching   Oxycodone Itching and Rash    Current Outpatient Medications  Medication Sig Dispense Refill   acetaminophen (TYLENOL) 650 MG CR tablet Take 650 mg by mouth 2 (two) times daily.     amoxicillin-clavulanate (AUGMENTIN) 875-125 MG tablet 1 tablet     atorvastatin (LIPITOR) 80 MG tablet Take 80 mg by mouth at bedtime.     benzonatate (TESSALON) 100 MG capsule 2 capsules     desmopressin (DDAVP) 0.2 MG tablet Take 1 tablet by mouth at bedtime.     fluticasone (FLONASE) 50 MCG/ACT nasal spray Place 1 spray into both nostrils daily. 16 g 2   fluticasone furoate-vilanterol (BREO ELLIPTA) 100-25 MCG/ACT AEPB Inhale 1 puff into the lungs daily. 28 each 6   lithium carbonate 300 MG capsule Take 300-600 mg by mouth See admin instructions. 300 mg in the morning and 600 mg in the evening  metFORMIN (GLUCOPHAGE) 500 MG tablet Take 500 mg by mouth daily.      ONE TOUCH ULTRA TEST test strip      traZODone (DESYREL) 100 MG tablet Take 100 mg by mouth at bedtime.     triamcinolone cream (KENALOG) 0.1 % Apply 1 application topically 2 (two) times daily.     warfarin (COUMADIN) 5 MG tablet Take 5 mg by mouth See admin instructions. Takes 1 tablet (5 mg) on  All days of week except monday     warfarin (COUMADIN) 7.5 MG tablet Take 1 tablet (7.5 mg total) by mouth one time only at 6 PM. (Patient taking differently: Take 7.5 mg by mouth one time only at 6 PM. Takes 7.5 mg on Monday only) 20 tablet 0   No current facility-administered medications for this visit.    PHYSICAL EXAM There were no vitals filed for this visit.  Constitutional: *** appearing. *** distress. Appears ***  nourished.  Neurologic: CN ***. *** focal findings. *** sensory loss. Psychiatric: *** Mood and affect symmetric and appropriate. Eyes: *** No icterus. No conjunctival pallor. Ears, nose, throat: *** mucous membranes moist. Midline trachea.  Cardiac: *** rate and rhythm.  Respiratory: *** unlabored. Abdominal: *** soft, non-tender, non-distended.  Peripheral vascular: *** Extremity: *** edema. *** cyanosis. *** pallor.  Skin: *** gangrene. *** ulceration.  Lymphatic: *** Stemmer's sign. *** palpable lymphadenopathy.    PERTINENT LABORATORY AND RADIOLOGIC DATA  Most recent CBC    Latest Ref Rng & Units 10/14/2021   11:48 AM 07/18/2020    7:54 AM 01/29/2017    5:13 AM  CBC  WBC 4.0 - 10.5 K/uL 5.1   8.8   Hemoglobin 13.0 - 17.0 g/dL 13.5  15.3  11.2   Hematocrit 39.0 - 52.0 % 42.0  45.0  34.2   Platelets 150 - 400 K/uL 147   131      Most recent CMP    Latest Ref Rng & Units 10/14/2021   11:48 AM 07/18/2020    7:54 AM 01/29/2017    5:13 AM  CMP  Glucose 70 - 99 mg/dL 86  114  123   BUN 8 - 23 mg/dL '14  18  17   '$ Creatinine 0.61 - 1.24 mg/dL 1.08  1.00  0.89   Sodium 135 - 145 mmol/L 139  142  137   Potassium 3.5 - 5.1 mmol/L 4.5  4.2  4.8   Chloride 98 - 111 mmol/L 108  108  105   CO2 22 - 32 mmol/L 25   29   Calcium 8.9 - 10.3 mg/dL 9.3   9.0     Renal function CrCl cannot be calculated (Patient's most recent lab result is older than the maximum 21 days allowed.).  Hgb A1c MFr Bld (%)  Date Value  01/20/2017 5.6    LDL Cholesterol  Date Value Ref Range Status  12/23/2007   Final   91        Total Cholesterol/HDL:CHD Risk Coronary Heart Disease Risk Table                     Men   Women  1/2 Average Risk   3.4   3.3     Vascular Imaging: ***   N. Stanford Breed, MD [facility] Phone Number: [phone] 08/17/2022 8:04 PM  Total time spent on preparing this encounter including chart review, data review, collecting  history, examining the patient, coordinating care  for this {tnhtimebilling:26202}  Portions of this report may have been transcribed using voice recognition software.  Every effort has been made to ensure accuracy; however, inadvertent computerized transcription errors may still be present.

## 2022-08-17 NOTE — H&P (View-Only) (Signed)
[facility] OF [place]  ASSESSMENT / PLAN: [name] Hall is a 76 y.o. 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   The [employer] of the [place] [employer] Vascular [employer] and Society for Vascular Surgery estimates annual rupture risk based on abdominal aneurysm diameter. The patient's estimated risk is 4.0 cm to 4.9 cm in diameter - 0.5% to 5%  The patient is a candidate for elective repair of the aneurysm to prevent rupture.  I explained the risks / benefits / alternatives to different approaches to aortic reconstruction.   After detailed discussion the patient and I agree that the best option for the patient is EVAR if his anatomy is amenable.   Recommend the following to reduce the risk of major adverse cardiac / limb events.  Complete cessation from all tobacco products. Blood glucose control with goal A1c < 7%. Blood pressure control with goal blood pressure < 140/90 mmHg. Lipid reduction therapy with goal LDL-C <100 mg/dL. Aspirin '81mg'$  PO QD.  Atorvastatin 40-'80mg'$  PO QD (or other "high intensity" statin therapy).  Patient will collect the CT angiogram from the [place] in the coming days.  I will get to review this in a week's time and call him with my interpretation.  Ideally, he will be a candidate for any intrarenal stent graft.  CHIEF COMPLAINT: Aneurysm  HISTORY OF PRESENT ILLNESS: [name] Hall is a 76 y.o. Hall referred to clinic for evaluation of enlarging abdominal aortic aneurysm and common iliac artery aneurysm.  The patient has been followed by the [facility] in [place] for the same.  He is asymptomatic from a aneurysm standpoint.  He reports history of smoking, but no family history of aneurysm disease, or connective tissue disease.  The bulk of our visit was spent reviewing the rationale for intervention, and the different options available for intervention.  VASCULAR  SURGICAL HISTORY: None  VASCULAR RISK FACTORS: Positive history of stroke / transient ischemic attack. Negative history of coronary artery disease.  Negative history of diabetes mellitus. Positive history of smoking. Not actively smoking. Positive history of hypertension.  Negative history of chronic kidney disease.   Negative history of chronic obstructive pulmonary disease.  FUNCTIONAL STATUS: ECOG performance status: (0) Fully active, able to carry on all predisease performance without restriction Ambulatory status: Ambulatory within the community without limits  [name] Hall (2pts) 75-79 or 80-84 (2pts) >84 (3pts) Dependence in toileting (1pt) Partial or full dependence in dressing (1pt) History of malignant neoplasm (2pts) CHF (3pts) COPD (1pts) CKD (3pts)  0-3 pts 6% 1 year mortality ; 21% 3 year mortality 4-5 pts 12% 1 year mortality ; 36% 3 year mortality >5 pts 21% 1 year mortality; 54% 3 year mortality   Past Medical History:  Diagnosis Date   Bipolar disorder ([place])    Diabetes mellitus    type 2   DVT (deep venous thrombosis) ([place]) 2006   leg after joint replacement    Dyspnea    with exertion occ   ED (erectile dysfunction)    Factor 5 Leiden mutation, heterozygous ([place])    Hepatitis    41 yrs ago hepatitis a   Hyperlipidemia    Orthostatic hypotension    Osteoarthritis    PE (pulmonary embolism) 01/27/2017   after right tha revision   PERSONAL HISTORY, VENOUS THROMBOSIS AND EMBOLISM    Prostate CA ([place]) 2012   treated with radiation  PROSTATE CANCER    Stroke ([place]) 15-20 yrs ago, saw on mri   TIA no residual from    Past Surgical History:  Procedure Laterality Date   CATARACT EXTRACTION Bilateral    colonoscopy     polyp located and tested negative   COLONOSCOPY WITH PROPOFOL  10/2003, [id number]   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   Procedure: PENILE PROTHESIS INFLATABLE;  Surgeon: Franchot Gallo, MD;  Location:  [facility];  Service: Urology;  Laterality: N/A;   TONSILLECTOMY AND ADENOIDECTOMY     AS A CHILD   TOTAL HIP ARTHROPLASTY     Left and right hip replacements   TOTAL HIP REVISION Right 01/27/2017   Procedure: RIGHT TOTAL HIP REVISION;  Surgeon: Gaynelle Arabian, MD;  Location: [facility];  Service: Orthopedics;  Laterality: Right;    Family History  Problem Relation Age of Onset   Hyperlipidemia Mother    Hypertension Mother    Heart attack Father    Hyperlipidemia Father    Hypertension Father    Sudden death Father    Depression Sister     Social History   Socioeconomic History   Marital status: Married    Spouse name: Not on file   Number of children: Not on file   Years of education: Not on file   Highest education level: Not on file  Occupational History   Not on file  Tobacco Use   Smoking status: Former    Packs/day: 1.00    Years: 30.00    Total pack years: 30.00    Types: Cigarettes    Quit date: 02/22/2014    Years since quitting: 8.4   Smokeless tobacco: Never  Vaping Use   Vaping Use: Never used  Substance and Sexual Activity   Alcohol use: No   Drug use: No   Sexual activity: Never  Other Topics Concern   Not on file  Social History Narrative   Not on file   Social Determinants of Health   Financial Resource Strain: Not on file  Food Insecurity: Not on file  Transportation Needs: Not on file  Physical Activity: Not on file  Stress: Not on file  Social Connections: Not on file  Intimate Partner Violence: Not on file    Allergies  Allergen Reactions   Altace [Ramipril] Hives   Sulfonamide Derivatives Hives and Itching   Oxycodone Itching and Rash    Current Outpatient Medications  Medication Sig Dispense Refill   acetaminophen (TYLENOL) 650 MG CR tablet Take 650 mg by mouth 2 (two) times daily.     amoxicillin-clavulanate (AUGMENTIN) 875-125 MG tablet 1 tablet     atorvastatin (LIPITOR) 80 MG tablet Take 80 mg by mouth at  bedtime.     benzonatate (TESSALON) 100 MG capsule 2 capsules     desmopressin (DDAVP) 0.2 MG tablet Take 1 tablet by mouth at bedtime.     fluticasone (FLONASE) 50 MCG/ACT nasal spray Place 1 spray into both nostrils daily. 16 g 2   fluticasone furoate-vilanterol (BREO ELLIPTA) 100-25 MCG/ACT AEPB Inhale 1 puff into the lungs daily. 28 each 6   lithium carbonate 300 MG capsule Take 300-600 mg by mouth See admin instructions. 300 mg in the morning and 600 mg in the evening     metFORMIN (GLUCOPHAGE) 500 MG tablet Take 500 mg by mouth daily.      ONE TOUCH ULTRA TEST test strip      traZODone (DESYREL) 100 MG tablet Take 100 mg  by mouth at bedtime.     triamcinolone cream (KENALOG) 0.1 % Apply 1 application topically 2 (two) times daily.     warfarin (COUMADIN) 5 MG tablet Take 5 mg by mouth See admin instructions. Takes 1 tablet (5 mg) on  All days of week except monday     warfarin (COUMADIN) 7.5 MG tablet Take 1 tablet (7.5 mg total) by mouth one time only at 6 PM. (Patient taking differently: Take 7.5 mg by mouth one time only at 6 PM. Takes 7.5 mg on Monday only) 20 tablet 0   No current facility-administered medications for this visit.    PHYSICAL EXAM Vitals:   08/18/22 1025  BP: 102/64  Pulse: 62  Resp: 20  Temp: 98.1 F (36.7 C)  SpO2: 100%  Weight: 201 lb (91.2 kg)  Height: '5\' 11"'$  (1.803 m)    Well-appearing gentleman in no acute distress Regular rate and rhythm Unlabored breathing Nondistended abdomen  PERTINENT LABORATORY AND RADIOLOGIC DATA  Most recent CBC    Latest Ref Rng & Units 10/14/2021   11:48 AM 07/18/2020    7:54 AM 01/29/2017    5:13 AM  CBC  WBC 4.0 - 10.5 K/uL 5.1   8.8   Hemoglobin 13.0 - 17.0 g/dL 13.5  15.3  11.2   Hematocrit 39.0 - 52.0 % 42.0  45.0  34.2   Platelets 150 - 400 K/uL 147   131      Most recent CMP    Latest Ref Rng & Units 10/14/2021   11:48 AM 07/18/2020    7:54 AM 01/29/2017    5:13 AM  CMP  Glucose 70 - 99 mg/dL 86  114   123   BUN 8 - 23 mg/dL '14  18  17   '$ Creatinine 0.61 - 1.24 mg/dL 1.08  1.00  0.89   Sodium 135 - 145 mmol/L 139  142  137   Potassium 3.5 - 5.1 mmol/L 4.5  4.2  4.8   Chloride 98 - 111 mmol/L 108  108  105   CO2 22 - 32 mmol/L 25   29   Calcium 8.9 - 10.3 mg/dL 9.3   9.0     Renal function CrCl cannot be calculated (Patient's most recent lab result is older than the maximum 21 days allowed.).  Hgb A1c MFr Bld (%)  Date Value  01/20/2017 5.6    LDL Cholesterol  Date Value Ref Range Status  12/23/2007   Final   91        Total Cholesterol/HDL:CHD Risk Coronary Heart Disease Risk Table                     Men   Women  1/2 Average Risk   3.4   3.3    Outside CT angiogram report suggest 45 mm infrarenal abdominal aortic aneurysm with a 35 mm iliac artery aneurysm.  I do not have the images for personal review.  Yevonne Aline. Stanford Breed, MD [facility] Phone Number: [phone] 08/17/2022 8:04 PM  Total time spent on preparing this encounter including chart review, data review, collecting history, examining the patient, coordinating care for this new patient, 60 minutes.  Portions of this report may have been transcribed using voice recognition software.  Every effort has been made to ensure accuracy; however, inadvertent computerized transcription errors may still be present.

## 2022-08-18 ENCOUNTER — Encounter: Payer: Self-pay | Admitting: Vascular Surgery

## 2022-08-18 ENCOUNTER — Ambulatory Visit (INDEPENDENT_AMBULATORY_CARE_PROVIDER_SITE_OTHER): Payer: No Typology Code available for payment source | Admitting: Vascular Surgery

## 2022-08-18 VITALS — BP 102/64 | HR 62 | Temp 98.1°F | Resp 20 | Ht 71.0 in | Wt 201.0 lb

## 2022-08-18 DIAGNOSIS — I7143 Infrarenal abdominal aortic aneurysm, without rupture: Secondary | ICD-10-CM

## 2022-08-19 DIAGNOSIS — R2689 Other abnormalities of gait and mobility: Secondary | ICD-10-CM | POA: Diagnosis not present

## 2022-08-19 DIAGNOSIS — Z7901 Long term (current) use of anticoagulants: Secondary | ICD-10-CM | POA: Diagnosis not present

## 2022-08-19 DIAGNOSIS — I959 Hypotension, unspecified: Secondary | ICD-10-CM | POA: Diagnosis not present

## 2022-08-19 DIAGNOSIS — Z96649 Presence of unspecified artificial hip joint: Secondary | ICD-10-CM | POA: Diagnosis not present

## 2022-08-25 ENCOUNTER — Ambulatory Visit: Payer: Medicare Other | Admitting: Vascular Surgery

## 2022-08-25 ENCOUNTER — Other Ambulatory Visit: Payer: Self-pay

## 2022-08-25 DIAGNOSIS — I7143 Infrarenal abdominal aortic aneurysm, without rupture: Secondary | ICD-10-CM

## 2022-08-25 NOTE — Progress Notes (Signed)
Reviewed patient's CT angiogram. Time he appears amenable to endovascular stent grafting. The left common iliac artery aneurysm will need to be treated either with delaying and extension of the repair onto the external iliac artery, or iliac branch device. I suspect the tortuous nature of his left hypogastric artery will make iliac branch device not suitable. We will schedule him for repair 08/31/2022 or 09/09/2022.  He will call with his preference.  Yevonne Aline. Stanford Breed, MD [facility] of [facility] Phone Number: [phone] 08/25/2022 8:36 AM

## 2022-08-26 NOTE — Pre-Procedure Instructions (Signed)
Surgical Instructions    Your procedure is scheduled on Monday, October 2nd.  Report to [facility] Main Entrance "A" at 5:30 A.M., then check in with the Admitting office.  Call this number if you have problems the morning of surgery:  [phone]   If you have any questions prior to your surgery date call [phone]: Open Monday-Friday 8am-4pm    Remember:  Do not eat or drink after midnight the night before your surgery    Take these medicines the morning of surgery with A SIP OF WATER  acetaminophen (TYLENOL)  fluticasone furoate-vilanterol (BREO ELLIPTA) benzonatate (TESSALON)-as needed  Follow your surgeon's instructions on when to stop warfarin (COUMADIN).  If no instructions were given by your surgeon then you will need to call the office to get those instructions.    As of today, STOP taking any Aspirin (unless otherwise instructed by your surgeon) Aleve, Naproxen, Ibuprofen, Motrin, Advil, Goody's, BC's, all herbal medications, fish oil, and all vitamins.          WHAT DO I DO ABOUT MY DIABETES MEDICATION?   Do not take metFORMIN (GLUCOPHAGE) the morning of surgery.   HOW TO MANAGE YOUR DIABETES BEFORE AND AFTER SURGERY  Why is it important to control my blood sugar before and after surgery? Improving blood sugar levels before and after surgery helps healing and can limit problems. A way of improving blood sugar control is eating a healthy diet by:  Eating less sugar and carbohydrates  Increasing activity/exercise  Talking with your doctor about reaching your blood sugar goals High blood sugars (greater than 180 mg/dL) can raise your risk of infections and slow your recovery, so you will need to focus on controlling your diabetes during the weeks before surgery. Make sure that the doctor who takes care of your diabetes knows about your planned surgery including the date and location.  How do I manage my blood sugar before surgery? Check your blood sugar at least  4 times a day, starting 2 days before surgery, to make sure that the level is not too high or low.  Check your blood sugar the morning of your surgery when you wake up and every 2 hours until you get to the Short Stay unit.  If your blood sugar is less than 70 mg/dL, you will need to treat for low blood sugar: Do not take insulin. Treat a low blood sugar (less than 70 mg/dL) with  cup of clear juice (cranberry or apple), 4 glucose tablets, OR glucose gel. Recheck blood sugar in 15 minutes after treatment (to make sure it is greater than 70 mg/dL). If your blood sugar is not greater than 70 mg/dL on recheck, call [phone] for further instructions. Report your blood sugar to the short stay nurse when you get to Short Stay.  If you are admitted to the hospital after surgery: Your blood sugar will be checked by the staff and you will probably be given insulin after surgery (instead of oral diabetes medicines) to make sure you have good blood sugar levels. The goal for blood sugar control after surgery is 80-180 mg/dL.             Do NOT Smoke (Tobacco/Vaping) for 24 hours prior to your procedure.  If you use a CPAP at night, you may bring your mask/headgear for your overnight stay.   Contacts, glasses, piercing's, hearing aid's, dentures or partials may not be worn into surgery, please bring cases for these belongings.    For  patients admitted to the hospital, discharge time will be determined by your treatment team.   Patients discharged the day of surgery will not be allowed to drive home, and someone needs to stay with them for 24 hours.  SURGICAL WAITING ROOM VISITATION Patients having surgery or a procedure may have no more than 2 support people in the waiting area - these visitors may rotate.   Children under the age of 66 must have an adult with them who is not the patient. If the patient needs to stay at the hospital during part of their recovery, the visitor guidelines for  inpatient rooms apply. Pre-op nurse will coordinate an appropriate time for 1 support person to accompany patient in pre-op.  This support person may not rotate.   Please refer to the [facility] website for the visitor guidelines for Inpatients (after your surgery is over and you are in a regular room).    Special instructions:   [place]- Preparing For Surgery  Before surgery, you can play an important role. Because skin is not sterile, your skin needs to be as free of germs as possible. You can reduce the number of germs on your skin by washing with CHG (chlorahexidine gluconate) Soap before surgery.  CHG is an antiseptic cleaner which kills germs and bonds with the skin to continue killing germs even after washing.    Oral Hygiene is also important to reduce your risk of infection.  Remember - BRUSH YOUR TEETH THE MORNING OF SURGERY WITH YOUR REGULAR TOOTHPASTE  Please do not use if you have an allergy to CHG or antibacterial soaps. If your skin becomes reddened/irritated stop using the CHG.  Do not shave (including legs and underarms) for at least 48 hours prior to first CHG shower. It is OK to shave your face.  Please follow these instructions carefully.   Shower the NIGHT BEFORE SURGERY and the MORNING OF SURGERY  If you chose to wash your hair, wash your hair first as usual with your normal shampoo.  After you shampoo, rinse your hair and body thoroughly to remove the shampoo.  Use CHG Soap as you would any other liquid soap. You can apply CHG directly to the skin and wash gently with a scrungie or a clean washcloth.   Apply the CHG Soap to your body ONLY FROM THE NECK DOWN.  Do not use on open wounds or open sores. Avoid contact with your eyes, ears, mouth and genitals (private parts). Wash Face and genitals (private parts)  with your normal soap.   Wash thoroughly, paying special attention to the area where your surgery will be performed.  Thoroughly rinse your body with  warm water from the neck down.  DO NOT shower/wash with your normal soap after using and rinsing off the CHG Soap.  Pat yourself dry with a CLEAN TOWEL.  Wear CLEAN PAJAMAS to bed the night before surgery  Place CLEAN SHEETS on your bed the night before your surgery  DO NOT SLEEP WITH PETS.   Day of Surgery: Take a shower with CHG soap. Do not wear jewelry  Do not wear lotions, powders, colognes, or deodorant. Men may shave face and neck. Do not bring valuables to the hospital.  [facility] is not responsible for any belongings or valuables. Wear Clean/Comfortable clothing the morning of surgery Remember to brush your teeth WITH YOUR REGULAR TOOTHPASTE.   Please read over the following fact sheets that you were given.    If you received  a COVID test during your pre-op visit  it is requested that you wear a mask when out in public, stay away from anyone that may not be feeling well and notify your surgeon if you develop symptoms. If you have been in contact with anyone that has tested positive in the last 10 days please notify you surgeon.

## 2022-08-27 ENCOUNTER — Encounter (HOSPITAL_COMMUNITY): Payer: Self-pay

## 2022-08-27 ENCOUNTER — Telehealth: Payer: Self-pay

## 2022-08-27 ENCOUNTER — Other Ambulatory Visit: Payer: Self-pay | Admitting: Vascular Surgery

## 2022-08-27 ENCOUNTER — Encounter (HOSPITAL_COMMUNITY)
Admission: RE | Admit: 2022-08-27 | Discharge: 2022-08-27 | Disposition: A | Discharge status: Home / Self Care | Payer: No Typology Code available for payment source | Source: Ambulatory Visit | Attending: Vascular Surgery | Admitting: Vascular Surgery

## 2022-08-27 ENCOUNTER — Other Ambulatory Visit: Payer: Self-pay

## 2022-08-27 VITALS — BP 129/82 | HR 88 | Temp 97.6°F | Resp 17 | Ht 71.0 in | Wt 197.6 lb

## 2022-08-27 DIAGNOSIS — I4510 Unspecified right bundle-branch block: Secondary | ICD-10-CM | POA: Insufficient documentation

## 2022-08-27 DIAGNOSIS — R8290 Unspecified abnormal findings in urine: Secondary | ICD-10-CM | POA: Diagnosis not present

## 2022-08-27 DIAGNOSIS — I7143 Infrarenal abdominal aortic aneurysm, without rupture: Secondary | ICD-10-CM | POA: Diagnosis not present

## 2022-08-27 DIAGNOSIS — Z01818 Encounter for other preprocedural examination: Secondary | ICD-10-CM | POA: Insufficient documentation

## 2022-08-27 LAB — PROTIME-INR
INR: 2.3 — ABNORMAL HIGH (ref 0.8–1.2)
Prothrombin Time: 25.5 seconds — ABNORMAL HIGH (ref 11.4–15.2)

## 2022-08-27 LAB — COMPREHENSIVE METABOLIC PANEL
ALT: 17 U/L (ref 0–44)
AST: 20 U/L (ref 15–41)
Albumin: 3.9 g/dL (ref 3.5–5.0)
Alkaline Phosphatase: 105 U/L (ref 38–126)
Anion gap: 9 (ref 5–15)
BUN: 21 mg/dL (ref 8–23)
CO2: 24 mmol/L (ref 22–32)
Calcium: 9.8 mg/dL (ref 8.9–10.3)
Chloride: 106 mmol/L (ref 98–111)
Creatinine, Ser: 1.32 mg/dL — ABNORMAL HIGH (ref 0.61–1.24)
GFR, Estimated: 56 mL/min — ABNORMAL LOW (ref >60)
Glucose, Bld: 98 mg/dL (ref 70–99)
Potassium: 4.3 mmol/L (ref 3.5–5.1)
Sodium: 139 mmol/L (ref 135–145)
Total Bilirubin: 1.2 mg/dL (ref 0.3–1.2)
Total Protein: 7.4 g/dL (ref 6.5–8.1)

## 2022-08-27 LAB — CBC
HCT: 43.7 % (ref 39.0–52.0)
Hemoglobin: 14.3 g/dL (ref 13.0–17.0)
MCH: 31 pg (ref 26.0–34.0)
MCHC: 32.7 g/dL (ref 30.0–36.0)
MCV: 94.8 fL (ref 80.0–100.0)
Platelets: 147 10*3/uL — ABNORMAL LOW (ref 150–400)
RBC: 4.61 MIL/uL (ref 4.22–5.81)
RDW: 13.3 % (ref 11.5–15.5)
WBC: 7.9 10*3/uL (ref 4.0–10.5)
nRBC: 0 % (ref 0.0–0.2)

## 2022-08-27 LAB — URINALYSIS, ROUTINE W REFLEX MICROSCOPIC
Bilirubin Urine: NEGATIVE
Glucose, UA: NEGATIVE mg/dL
Hgb urine dipstick: NEGATIVE
Ketones, ur: NEGATIVE mg/dL
Nitrite: NEGATIVE
Protein, ur: NEGATIVE mg/dL
Specific Gravity, Urine: 1.01 (ref 1.005–1.030)
pH: 6 (ref 5.0–8.0)

## 2022-08-27 LAB — TYPE AND SCREEN
ABO/RH(D): O POS
Antibody Screen: NEGATIVE

## 2022-08-27 LAB — APTT: aPTT: 41 seconds — ABNORMAL HIGH (ref 24–36)

## 2022-08-27 LAB — SURGICAL PCR SCREEN
MRSA, PCR: NOT DETECTED — AB
Staphylococcus aureus: POSITIVE — AB

## 2022-08-27 LAB — GLUCOSE, CAPILLARY: Glucose-Capillary: 99 mg/dL (ref 70–99)

## 2022-08-27 MED ORDER — NITROFURANTOIN MONOHYD MACRO 100 MG PO CAPS: 100.0000 mg | ORAL_CAPSULE | Freq: Two times a day (BID) | ORAL | 0 refills | Status: AC

## 2022-08-27 NOTE — Progress Notes (Signed)
Notified about pat

## 2022-08-27 NOTE — Telephone Encounter (Signed)
Spoke with Pamala Hurry at Dr. Harrington Challenger' office regarding medication clearance for warfarin prior to EVAR on 08/31/22. Form has been faxed to [facility] stating patient can hold medication for 3 days prior, no lovenox bridging needed. Their office has discussed with patient.

## 2022-08-27 NOTE — Progress Notes (Signed)
PCP - Lawerance Cruel MD Cardiologist - Carlis Stable MD,[facility]  PPM/ICD - denies Device Orders -  Rep Notified -   Chest x-ray - 10/14/21 EKG - 08/27/22 Stress Test -  ECHO - 04/13/12 Cardiac Cath - none  Sleep Study - none CPAP -   Fasting Blood Sugar - 110-130 Checks Blood Sugar once a week  Blood Thinner Instructions: pt states his last dose of Warfarin is 08/27/22 per surgeon's instructions Aspirin Instructions:na  ERAS Protcol -no PRE-SURGERY Ensure or G2-   COVID TEST- na   Anesthesia review: yes pt has blood clotting disorder.   Patient denies shortness of breath, fever, cough and chest pain at PAT appointment   All instructions explained to the patient, with a verbal understanding of the material. Patient agrees to go over the instructions while at home for a better understanding. Patient also instructed to wear a mask when out in public prior to surgery. The opportunity to ask questions was provided.

## 2022-08-27 NOTE — Progress Notes (Signed)
UA results showed moderate leukocytes and rare bacteria. Secure message left with scheduler at [facility] and [employer] sent to Dr. Stanford Breed. Chart sent to anesthesia for review.

## 2022-08-31 ENCOUNTER — Encounter (HOSPITAL_COMMUNITY): Payer: Self-pay | Admitting: Vascular Surgery

## 2022-08-31 ENCOUNTER — Encounter (HOSPITAL_COMMUNITY)
Admission: RE | Disposition: A | Discharge status: Home / Self Care | Payer: Self-pay | Source: Home / Self Care | Attending: Vascular Surgery

## 2022-08-31 ENCOUNTER — Inpatient Hospital Stay (HOSPITAL_COMMUNITY): Payer: No Typology Code available for payment source | Admitting: Certified Registered Nurse Anesthetist

## 2022-08-31 ENCOUNTER — Inpatient Hospital Stay (HOSPITAL_COMMUNITY)
Admission: RE | Admit: 2022-08-31 | Discharge: 2022-09-01 | DRG: 269 | Disposition: A | Discharge status: Home / Self Care | Payer: No Typology Code available for payment source | Attending: Vascular Surgery | Admitting: Vascular Surgery

## 2022-08-31 ENCOUNTER — Other Ambulatory Visit: Payer: Self-pay

## 2022-08-31 ENCOUNTER — Inpatient Hospital Stay (HOSPITAL_COMMUNITY): Payer: No Typology Code available for payment source

## 2022-08-31 DIAGNOSIS — F319 Bipolar disorder, unspecified: Secondary | ICD-10-CM | POA: Diagnosis present

## 2022-08-31 DIAGNOSIS — Z7901 Long term (current) use of anticoagulants: Secondary | ICD-10-CM

## 2022-08-31 DIAGNOSIS — I7143 Infrarenal abdominal aortic aneurysm, without rupture: Secondary | ICD-10-CM | POA: Diagnosis present

## 2022-08-31 DIAGNOSIS — E785 Hyperlipidemia, unspecified: Secondary | ICD-10-CM | POA: Diagnosis present

## 2022-08-31 DIAGNOSIS — Y712 Prosthetic and other implants, materials and accessory cardiovascular devices associated with adverse incidents: Secondary | ICD-10-CM | POA: Diagnosis not present

## 2022-08-31 DIAGNOSIS — M1990 Unspecified osteoarthritis, unspecified site: Secondary | ICD-10-CM | POA: Diagnosis present

## 2022-08-31 DIAGNOSIS — T82310A Breakdown (mechanical) of aortic (bifurcation) graft (replacement), initial encounter: Secondary | ICD-10-CM | POA: Diagnosis not present

## 2022-08-31 DIAGNOSIS — Z86718 Personal history of other venous thrombosis and embolism: Secondary | ICD-10-CM | POA: Diagnosis not present

## 2022-08-31 DIAGNOSIS — Z96643 Presence of artificial hip joint, bilateral: Secondary | ICD-10-CM | POA: Diagnosis present

## 2022-08-31 DIAGNOSIS — Z923 Personal history of irradiation: Secondary | ICD-10-CM | POA: Diagnosis not present

## 2022-08-31 DIAGNOSIS — E119 Type 2 diabetes mellitus without complications: Secondary | ICD-10-CM

## 2022-08-31 DIAGNOSIS — I10 Essential (primary) hypertension: Secondary | ICD-10-CM | POA: Diagnosis present

## 2022-08-31 DIAGNOSIS — Z885 Allergy status to narcotic agent status: Secondary | ICD-10-CM

## 2022-08-31 DIAGNOSIS — Z8349 Family history of other endocrine, nutritional and metabolic diseases: Secondary | ICD-10-CM | POA: Diagnosis not present

## 2022-08-31 DIAGNOSIS — Z882 Allergy status to sulfonamides status: Secondary | ICD-10-CM | POA: Diagnosis not present

## 2022-08-31 DIAGNOSIS — Z79899 Other long term (current) drug therapy: Secondary | ICD-10-CM | POA: Diagnosis not present

## 2022-08-31 DIAGNOSIS — Z8546 Personal history of malignant neoplasm of prostate: Secondary | ICD-10-CM | POA: Diagnosis not present

## 2022-08-31 DIAGNOSIS — Z8249 Family history of ischemic heart disease and other diseases of the circulatory system: Secondary | ICD-10-CM | POA: Diagnosis not present

## 2022-08-31 DIAGNOSIS — Z87891 Personal history of nicotine dependence: Secondary | ICD-10-CM

## 2022-08-31 DIAGNOSIS — Z818 Family history of other mental and behavioral disorders: Secondary | ICD-10-CM | POA: Diagnosis not present

## 2022-08-31 DIAGNOSIS — Z01818 Encounter for other preprocedural examination: Secondary | ICD-10-CM

## 2022-08-31 DIAGNOSIS — Z7984 Long term (current) use of oral hypoglycemic drugs: Secondary | ICD-10-CM

## 2022-08-31 DIAGNOSIS — I714 Abdominal aortic aneurysm, without rupture, unspecified: Principal | ICD-10-CM | POA: Diagnosis present

## 2022-08-31 DIAGNOSIS — Y713 Surgical instruments, materials and cardiovascular devices (including sutures) associated with adverse incidents: Secondary | ICD-10-CM | POA: Diagnosis present

## 2022-08-31 DIAGNOSIS — Z888 Allergy status to other drugs, medicaments and biological substances status: Secondary | ICD-10-CM | POA: Diagnosis not present

## 2022-08-31 DIAGNOSIS — D6851 Activated protein C resistance: Secondary | ICD-10-CM | POA: Diagnosis present

## 2022-08-31 DIAGNOSIS — Z7951 Long term (current) use of inhaled steroids: Secondary | ICD-10-CM

## 2022-08-31 DIAGNOSIS — Z8673 Personal history of transient ischemic attack (TIA), and cerebral infarction without residual deficits: Secondary | ICD-10-CM | POA: Diagnosis not present

## 2022-08-31 DIAGNOSIS — I723 Aneurysm of iliac artery: Secondary | ICD-10-CM | POA: Diagnosis present

## 2022-08-31 HISTORY — PX: ABDOMINAL AORTIC ENDOVASCULAR STENT GRAFT: SHX5707

## 2022-08-31 LAB — CBC
HCT: 37 % — ABNORMAL LOW (ref 39.0–52.0)
Hemoglobin: 11.7 g/dL — ABNORMAL LOW (ref 13.0–17.0)
MCH: 30.4 pg (ref 26.0–34.0)
MCHC: 31.6 g/dL (ref 30.0–36.0)
MCV: 96.1 fL (ref 80.0–100.0)
Platelets: 90 10*3/uL — ABNORMAL LOW (ref 150–400)
RBC: 3.85 MIL/uL — ABNORMAL LOW (ref 4.22–5.81)
RDW: 13.5 % (ref 11.5–15.5)
WBC: 5.7 10*3/uL (ref 4.0–10.5)
nRBC: 0 % (ref 0.0–0.2)

## 2022-08-31 LAB — PROTIME-INR
INR: 1.3 — ABNORMAL HIGH (ref 0.8–1.2)
INR: 1.4 — ABNORMAL HIGH (ref 0.8–1.2)
Prothrombin Time: 16.4 seconds — ABNORMAL HIGH (ref 11.4–15.2)
Prothrombin Time: 17.2 seconds — ABNORMAL HIGH (ref 11.4–15.2)

## 2022-08-31 LAB — BASIC METABOLIC PANEL WITH GFR
Anion gap: 4 — ABNORMAL LOW (ref 5–15)
BUN: 18 mg/dL (ref 8–23)
CO2: 22 mmol/L (ref 22–32)
Calcium: 8.5 mg/dL — ABNORMAL LOW (ref 8.9–10.3)
Chloride: 110 mmol/L (ref 98–111)
Creatinine, Ser: 1.24 mg/dL (ref 0.61–1.24)
GFR, Estimated: >60 mL/min (ref >60)
Glucose, Bld: 103 mg/dL — ABNORMAL HIGH (ref 70–99)
Potassium: 4.3 mmol/L (ref 3.5–5.1)
Sodium: 136 mmol/L (ref 135–145)

## 2022-08-31 LAB — APTT: aPTT: 37 seconds — ABNORMAL HIGH (ref 24–36)

## 2022-08-31 LAB — POCT ACTIVATED CLOTTING TIME
Activated Clotting Time: 227 seconds
Activated Clotting Time: 239 seconds
Activated Clotting Time: 239 seconds

## 2022-08-31 LAB — GLUCOSE, CAPILLARY
Glucose-Capillary: 100 mg/dL — ABNORMAL HIGH (ref 70–99)
Glucose-Capillary: 106 mg/dL — ABNORMAL HIGH (ref 70–99)
Glucose-Capillary: 138 mg/dL — ABNORMAL HIGH (ref 70–99)

## 2022-08-31 LAB — MAGNESIUM: Magnesium: 2.3 mg/dL (ref 1.7–2.4)

## 2022-08-31 SURGERY — INSERTION, ENDOVASCULAR STENT GRAFT, AORTA, ABDOMINAL
Anesthesia: General | Site: Groin | Laterality: Bilateral

## 2022-08-31 MED ORDER — ACETAMINOPHEN 325 MG PO TABS
650.0000 mg | ORAL_TABLET | Freq: Two times a day (BID) | ORAL | Status: DC
  Filled 2022-08-31 (×2): qty 2

## 2022-08-31 MED ORDER — SODIUM CHLORIDE 0.9 % IV SOLN: INTRAVENOUS | Status: DC

## 2022-08-31 MED ORDER — PHENYLEPHRINE 80 MCG/ML (10ML) SYRINGE FOR IV PUSH (FOR BLOOD PRESSURE SUPPORT)
PREFILLED_SYRINGE | INTRAVENOUS | Status: DC | PRN
  Administered 2022-08-31: 80 ug via INTRAVENOUS
  Administered 2022-08-31: 240 ug via INTRAVENOUS

## 2022-08-31 MED ORDER — ACETAMINOPHEN 650 MG RE SUPP: 325.0000 mg | RECTAL | Status: DC | PRN

## 2022-08-31 MED ORDER — CEFAZOLIN SODIUM-DEXTROSE 2-4 GM/100ML-% IV SOLN
2.0000 g | Freq: Three times a day (TID) | INTRAVENOUS | Status: AC
  Administered 2022-08-31 (×2): 2 g via INTRAVENOUS
  Filled 2022-08-31 (×2): qty 100

## 2022-08-31 MED ORDER — CHLORHEXIDINE GLUCONATE CLOTH 2 % EX PADS: 6.0000 | MEDICATED_PAD | Freq: Once | CUTANEOUS | Status: DC

## 2022-08-31 MED ORDER — HEPARIN 6000 UNIT IRRIGATION SOLUTION
Status: DC | PRN
  Administered 2022-08-31: 1

## 2022-08-31 MED ORDER — ORAL CARE MOUTH RINSE: 15.0000 mL | Freq: Once | OROMUCOSAL | Status: AC

## 2022-08-31 MED ORDER — ONDANSETRON HCL 4 MG/2ML IJ SOLN: 4.0000 mg | Freq: Four times a day (QID) | INTRAMUSCULAR | Status: DC | PRN

## 2022-08-31 MED ORDER — ROCURONIUM BROMIDE 10 MG/ML (PF) SYRINGE
PREFILLED_SYRINGE | INTRAVENOUS | Status: DC | PRN
  Administered 2022-08-31: 60 mg via INTRAVENOUS

## 2022-08-31 MED ORDER — ONDANSETRON HCL 4 MG/2ML IJ SOLN
INTRAMUSCULAR | Status: DC | PRN
  Administered 2022-08-31: 4 mg via INTRAVENOUS

## 2022-08-31 MED ORDER — PROPOFOL 10 MG/ML IV BOLUS
INTRAVENOUS | Status: DC | PRN
  Administered 2022-08-31: 20 mg via INTRAVENOUS
  Administered 2022-08-31: 120 mg via INTRAVENOUS
  Administered 2022-08-31: 10 mg via INTRAVENOUS

## 2022-08-31 MED ORDER — GUAIFENESIN-DM 100-10 MG/5ML PO SYRP: 15.0000 mL | ORAL_SOLUTION | ORAL | Status: DC | PRN

## 2022-08-31 MED ORDER — HYDRALAZINE HCL 20 MG/ML IJ SOLN
10.0000 mg | Freq: Once | INTRAMUSCULAR | Status: AC
  Administered 2022-08-31: 10 mg via INTRAVENOUS

## 2022-08-31 MED ORDER — METOPROLOL TARTRATE 5 MG/5ML IV SOLN: 2.0000 mg | INTRAVENOUS | Status: DC | PRN

## 2022-08-31 MED ORDER — ACETAMINOPHEN 325 MG PO TABS
650.0000 mg | ORAL_TABLET | Freq: Two times a day (BID) | ORAL | Status: DC
  Administered 2022-08-31 – 2022-09-01 (×2): 650 mg via ORAL
  Filled 2022-08-31 (×2): qty 2

## 2022-08-31 MED ORDER — BENZONATATE 100 MG PO CAPS: 100.0000 mg | ORAL_CAPSULE | Freq: Every day | ORAL | Status: DC | PRN

## 2022-08-31 MED ORDER — ASPIRIN 81 MG PO TBEC
81.0000 mg | DELAYED_RELEASE_TABLET | Freq: Every day | ORAL | Status: DC
  Administered 2022-09-01: 81 mg via ORAL
  Filled 2022-08-31: qty 1

## 2022-08-31 MED ORDER — HYDROMORPHONE HCL 1 MG/ML IJ SOLN: 0.5000 mg | INTRAMUSCULAR | Status: DC | PRN

## 2022-08-31 MED ORDER — MAGNESIUM SULFATE 2 GM/50ML IV SOLN: 2.0000 g | Freq: Every day | INTRAVENOUS | Status: DC | PRN

## 2022-08-31 MED ORDER — HYDRALAZINE HCL 20 MG/ML IJ SOLN: 5.0000 mg | INTRAMUSCULAR | Status: DC | PRN

## 2022-08-31 MED ORDER — HEPARIN SODIUM (PORCINE) 1000 UNIT/ML IJ SOLN
INTRAMUSCULAR | Status: AC
  Filled 2022-08-31: qty 10

## 2022-08-31 MED ORDER — OXYCODONE HCL 5 MG PO TABS: 5.0000 mg | ORAL_TABLET | ORAL | Status: DC | PRN

## 2022-08-31 MED ORDER — MIDAZOLAM HCL 2 MG/2ML IJ SOLN
INTRAMUSCULAR | Status: AC
  Filled 2022-08-31: qty 2

## 2022-08-31 MED ORDER — LABETALOL HCL 5 MG/ML IV SOLN
10.0000 mg | INTRAVENOUS | Status: DC | PRN
  Administered 2022-08-31: 10 mg via INTRAVENOUS

## 2022-08-31 MED ORDER — FLUTICASONE FUROATE-VILANTEROL 100-25 MCG/ACT IN AEPB
1.0000 | INHALATION_SPRAY | Freq: Every day | RESPIRATORY_TRACT | Status: DC
  Administered 2022-09-01: 1 via RESPIRATORY_TRACT
  Filled 2022-08-31: qty 28

## 2022-08-31 MED ORDER — PROPOFOL 10 MG/ML IV BOLUS
INTRAVENOUS | Status: AC
  Filled 2022-08-31: qty 20

## 2022-08-31 MED ORDER — DIPHENHYDRAMINE HCL 25 MG PO CAPS: 25.0000 mg | ORAL_CAPSULE | Freq: Four times a day (QID) | ORAL | Status: DC | PRN

## 2022-08-31 MED ORDER — LIDOCAINE 2% (20 MG/ML) 5 ML SYRINGE
INTRAMUSCULAR | Status: DC | PRN
  Administered 2022-08-31: 60 mg via INTRAVENOUS

## 2022-08-31 MED ORDER — NITROFURANTOIN MONOHYD MACRO 100 MG PO CAPS
100.0000 mg | ORAL_CAPSULE | Freq: Two times a day (BID) | ORAL | Status: DC
  Administered 2022-08-31 – 2022-09-01 (×2): 100 mg via ORAL
  Filled 2022-08-31 (×2): qty 1

## 2022-08-31 MED ORDER — POTASSIUM CHLORIDE CRYS ER 20 MEQ PO TBCR: 20.0000 meq | EXTENDED_RELEASE_TABLET | Freq: Every day | ORAL | Status: DC | PRN

## 2022-08-31 MED ORDER — HEPARIN SODIUM (PORCINE) 5000 UNIT/ML IJ SOLN
5000.0000 [IU] | Freq: Three times a day (TID) | INTRAMUSCULAR | Status: DC
  Administered 2022-09-01: 5000 [IU] via SUBCUTANEOUS
  Filled 2022-08-31: qty 1

## 2022-08-31 MED ORDER — PROTAMINE SULFATE 10 MG/ML IV SOLN
INTRAVENOUS | Status: AC
  Filled 2022-08-31: qty 5

## 2022-08-31 MED ORDER — PHENYLEPHRINE HCL-NACL 20-0.9 MG/250ML-% IV SOLN
INTRAVENOUS | Status: DC | PRN
  Administered 2022-08-31: 40 ug/min via INTRAVENOUS

## 2022-08-31 MED ORDER — PANTOPRAZOLE SODIUM 40 MG PO TBEC
40.0000 mg | DELAYED_RELEASE_TABLET | Freq: Every day | ORAL | Status: DC
  Administered 2022-09-01: 40 mg via ORAL
  Filled 2022-08-31: qty 1

## 2022-08-31 MED ORDER — SODIUM CHLORIDE 0.9 % IV SOLN: 500.0000 mL | Freq: Once | INTRAVENOUS | Status: DC | PRN

## 2022-08-31 MED ORDER — PROTAMINE SULFATE 10 MG/ML IV SOLN
INTRAVENOUS | Status: DC | PRN
  Administered 2022-08-31: 50 mg via INTRAVENOUS

## 2022-08-31 MED ORDER — ACETAMINOPHEN 325 MG PO TABS
325.0000 mg | ORAL_TABLET | ORAL | Status: DC | PRN
  Administered 2022-08-31: 650 mg via ORAL
  Filled 2022-08-31: qty 2

## 2022-08-31 MED ORDER — SENNOSIDES-DOCUSATE SODIUM 8.6-50 MG PO TABS: 1.0000 | ORAL_TABLET | Freq: Every evening | ORAL | Status: DC | PRN

## 2022-08-31 MED ORDER — LIDOCAINE 2% (20 MG/ML) 5 ML SYRINGE
INTRAMUSCULAR | Status: AC
  Filled 2022-08-31: qty 5

## 2022-08-31 MED ORDER — HEPARIN 6000 UNIT IRRIGATION SOLUTION
Status: AC
  Filled 2022-08-31: qty 500

## 2022-08-31 MED ORDER — FENTANYL CITRATE (PF) 250 MCG/5ML IJ SOLN
INTRAMUSCULAR | Status: AC
  Filled 2022-08-31: qty 5

## 2022-08-31 MED ORDER — ALUM & MAG HYDROXIDE-SIMETH 200-200-20 MG/5ML PO SUSP: 15.0000 mL | ORAL | Status: DC | PRN

## 2022-08-31 MED ORDER — LITHIUM CARBONATE 300 MG PO CAPS
450.0000 mg | ORAL_CAPSULE | Freq: Two times a day (BID) | ORAL | Status: DC
  Administered 2022-08-31 – 2022-09-01 (×2): 450 mg via ORAL
  Filled 2022-08-31 (×2): qty 1

## 2022-08-31 MED ORDER — TRAZODONE HCL 100 MG PO TABS
100.0000 mg | ORAL_TABLET | Freq: Every day | ORAL | Status: DC
  Filled 2022-08-31: qty 1

## 2022-08-31 MED ORDER — FENTANYL CITRATE (PF) 100 MCG/2ML IJ SOLN
INTRAMUSCULAR | Status: DC | PRN
  Administered 2022-08-31: 100 ug via INTRAVENOUS
  Administered 2022-08-31: 50 ug via INTRAVENOUS

## 2022-08-31 MED ORDER — LABETALOL HCL 5 MG/ML IV SOLN
INTRAVENOUS | Status: AC
  Filled 2022-08-31: qty 4

## 2022-08-31 MED ORDER — DESMOPRESSIN ACETATE 0.1 MG PO TABS
0.3000 mg | ORAL_TABLET | Freq: Every day | ORAL | Status: DC
  Administered 2022-08-31: 0.3 mg via ORAL
  Filled 2022-08-31: qty 3

## 2022-08-31 MED ORDER — IODIXANOL 320 MG/ML IV SOLN
INTRAVENOUS | Status: DC | PRN
  Administered 2022-08-31: 100 mL via INTRA_ARTERIAL
  Administered 2022-08-31: 71.4 mL via INTRA_ARTERIAL

## 2022-08-31 MED ORDER — PHENOL 1.4 % MT LIQD: 1.0000 | OROMUCOSAL | Status: DC | PRN

## 2022-08-31 MED ORDER — CHLORHEXIDINE GLUCONATE 0.12 % MT SOLN
15.0000 mL | Freq: Once | OROMUCOSAL | Status: AC
  Administered 2022-08-31: 15 mL via OROMUCOSAL
  Filled 2022-08-31: qty 15

## 2022-08-31 MED ORDER — BISACODYL 5 MG PO TBEC: 5.0000 mg | DELAYED_RELEASE_TABLET | Freq: Every day | ORAL | Status: DC | PRN

## 2022-08-31 MED ORDER — HEPARIN SODIUM (PORCINE) 1000 UNIT/ML IJ SOLN
INTRAMUSCULAR | Status: DC | PRN
  Administered 2022-08-31: 1000 [IU] via INTRAVENOUS
  Administered 2022-08-31: 2000 [IU] via INTRAVENOUS
  Administered 2022-08-31: 9000 [IU] via INTRAVENOUS

## 2022-08-31 MED ORDER — DOCUSATE SODIUM 100 MG PO CAPS
100.0000 mg | ORAL_CAPSULE | Freq: Every day | ORAL | Status: DC
  Administered 2022-09-01: 100 mg via ORAL
  Filled 2022-08-31: qty 1

## 2022-08-31 MED ORDER — DEXAMETHASONE SODIUM PHOSPHATE 10 MG/ML IJ SOLN
INTRAMUSCULAR | Status: DC | PRN
  Administered 2022-08-31: 5 mg via INTRAVENOUS

## 2022-08-31 MED ORDER — EPHEDRINE SULFATE-NACL 50-0.9 MG/10ML-% IV SOSY
PREFILLED_SYRINGE | INTRAVENOUS | Status: DC | PRN
  Administered 2022-08-31: 5 mg via INTRAVENOUS

## 2022-08-31 MED ORDER — HYDROCODONE-ACETAMINOPHEN 5-325 MG PO TABS
1.0000 | ORAL_TABLET | Freq: Four times a day (QID) | ORAL | Status: DC | PRN
  Filled 2022-08-31: qty 1

## 2022-08-31 MED ORDER — LACTATED RINGERS IV SOLN: INTRAVENOUS | Status: DC

## 2022-08-31 MED ORDER — HYDRALAZINE HCL 20 MG/ML IJ SOLN
INTRAMUSCULAR | Status: AC
  Filled 2022-08-31: qty 1

## 2022-08-31 MED ORDER — FENTANYL CITRATE (PF) 100 MCG/2ML IJ SOLN
INTRAMUSCULAR | Status: AC
  Filled 2022-08-31: qty 2

## 2022-08-31 MED ORDER — ATORVASTATIN CALCIUM 80 MG PO TABS
80.0000 mg | ORAL_TABLET | Freq: Every day | ORAL | Status: DC
  Administered 2022-08-31: 80 mg via ORAL
  Filled 2022-08-31: qty 1

## 2022-08-31 MED ORDER — MIDAZOLAM HCL 2 MG/2ML IJ SOLN
INTRAMUSCULAR | Status: DC | PRN
  Administered 2022-08-31: 2 mg via INTRAVENOUS

## 2022-08-31 MED ORDER — CEFAZOLIN SODIUM-DEXTROSE 2-4 GM/100ML-% IV SOLN
2.0000 g | INTRAVENOUS | Status: AC
  Administered 2022-08-31: 2 g via INTRAVENOUS
  Filled 2022-08-31: qty 100

## 2022-08-31 MED ORDER — FENTANYL CITRATE (PF) 100 MCG/2ML IJ SOLN
25.0000 ug | INTRAMUSCULAR | Status: DC | PRN
  Administered 2022-08-31: 50 ug via INTRAVENOUS

## 2022-08-31 SURGICAL SUPPLY — 62 items
BAG COUNTER SPONGE SURGICOUNT (BAG) ×1 IMPLANT
BAG DECANTER FOR FLEXI CONT (MISCELLANEOUS) IMPLANT
BENZOIN TINCTURE PRP APPL 2/3 (GAUZE/BANDAGES/DRESSINGS) ×1 IMPLANT
BLADE CLIPPER SURG (BLADE) ×1 IMPLANT
CANISTER SUCT 3000ML PPV (MISCELLANEOUS) ×1 IMPLANT
CATH BALLOON XXL 14X20X120 (BALLOONS) IMPLANT
CATH BEACON 5.038 65CM KMP-01 (CATHETERS) ×1 IMPLANT
CATH OMNI FLUSH .035X70CM (CATHETERS) ×1 IMPLANT
CHLORAPREP W/TINT 26 (MISCELLANEOUS) ×1 IMPLANT
CLOSURE PERCLOSE PROSTYLE (VASCULAR PRODUCTS) IMPLANT
CLSR STERI-STRIP ANTIMIC 1/2X4 (GAUZE/BANDAGES/DRESSINGS) IMPLANT
DERMABOND ADVANCED .7 DNX12 (GAUZE/BANDAGES/DRESSINGS) IMPLANT
DEVICE ENSNARE  12MMX20MM (VASCULAR PRODUCTS) ×1
DEVICE ENSNARE 12MMX20MM (VASCULAR PRODUCTS) IMPLANT
DEVICE TORQUE H2O (MISCELLANEOUS) IMPLANT
DRAPE HALF SHEET 40X57 (DRAPES) IMPLANT
DRSG TEGADERM 2-3/8X2-3/4 SM (GAUZE/BANDAGES/DRESSINGS) ×2 IMPLANT
DRYSEAL FLEXSHEATH 12FR 45CM (SHEATH) ×1
DRYSEAL FLEXSHEATH 16FR 33CM (SHEATH) ×1
DRYSEAL FLEXSHEATH 18FR 33CM (SHEATH) ×1
ELECT REM PT RETURN 9FT ADLT (ELECTROSURGICAL) ×2
ELECTRODE REM PT RTRN 9FT ADLT (ELECTROSURGICAL) ×2 IMPLANT
ENDOPRO ILIAC 23X12X10 FA (Endovascular Graft) ×1 IMPLANT
ENDOPROSTHESIS EX VBN 8X79X135 (Endovascular Graft) IMPLANT
ENDOPROSTHESIS ILI 23X12X10 FA (Endovascular Graft) IMPLANT
ENDOPROTH EXP VIABAHN 8X79X135 (Endovascular Graft) ×1 IMPLANT
EXCLUDER TNK LEG 31MX14X13 (Endovascular Graft) IMPLANT
EXCLUDER TRUNK LEG 31MX14X13 (Endovascular Graft) ×1 IMPLANT
GAUZE SPONGE 2X2 8PLY STRL LF (GAUZE/BANDAGES/DRESSINGS) ×2 IMPLANT
GLOVE BIO SURGEON STRL SZ8 (GLOVE) ×1 IMPLANT
GOWN STRL REUS W/ TWL LRG LVL3 (GOWN DISPOSABLE) ×2 IMPLANT
GOWN STRL REUS W/ TWL XL LVL3 (GOWN DISPOSABLE) ×1 IMPLANT
GOWN STRL REUS W/TWL LRG LVL3 (GOWN DISPOSABLE) ×2
GOWN STRL REUS W/TWL XL LVL3 (GOWN DISPOSABLE) ×1
GRAFT BALLN CATH 65CM (STENTS) ×1 IMPLANT
GUIDEWIRE ANGLED .035X150CM (WIRE) IMPLANT
GUIDEWIRE ANGLED .035X260CM (WIRE) IMPLANT
KIT BASIN OR (CUSTOM PROCEDURE TRAY) ×1 IMPLANT
KIT ENCORE 26 ADVANTAGE (KITS) IMPLANT
KIT TURNOVER KIT B (KITS) ×1 IMPLANT
LEG CONTRALATERAL 27X10 (Vascular Products) IMPLANT
NS IRRIG 1000ML POUR BTL (IV SOLUTION) ×1 IMPLANT
PACK ENDOVASCULAR (PACKS) ×1 IMPLANT
PAD ARMBOARD 7.5X6 YLW CONV (MISCELLANEOUS) ×2 IMPLANT
PENCIL BUTTON HOLSTER BLD 10FT (ELECTRODE) IMPLANT
SET MICROPUNCTURE 5F STIFF (MISCELLANEOUS) ×1 IMPLANT
SHEATH BRITE TIP 8FR 23CM (SHEATH) ×1 IMPLANT
SHEATH DRYSEAL FLEX 12FR 45CM (SHEATH) IMPLANT
SHEATH DRYSEAL FLEX 16FR 33CM (SHEATH) IMPLANT
SHEATH DRYSEAL FLEX 18FR 33CM (SHEATH) IMPLANT
SHEATH PINNACLE 8F 10CM (SHEATH) ×1 IMPLANT
STENT GRAFT BALLN CATH 65CM (STENTS) ×1
STOPCOCK MORSE 400PSI 3WAY (MISCELLANEOUS) ×1 IMPLANT
STRIP CLOSURE SKIN 1/2X4 (GAUZE/BANDAGES/DRESSINGS) ×2 IMPLANT
SUT MNCRL AB 4-0 PS2 18 (SUTURE) ×2 IMPLANT
SYR 20CC LL (SYRINGE) ×2 IMPLANT
TOWEL GREEN STERILE (TOWEL DISPOSABLE) ×1 IMPLANT
TRAY FOLEY MTR SLVR 16FR STAT (SET/KITS/TRAYS/PACK) ×1 IMPLANT
TUBING INJECTOR 48 (MISCELLANEOUS) ×1 IMPLANT
WIRE BENTSON .035X145CM (WIRE) ×2 IMPLANT
WIRE LUNDERQUIST .035X180CM (WIRE) ×2 IMPLANT
WIRE ROSEN-J .035X260CM (WIRE) IMPLANT

## 2022-08-31 NOTE — Anesthesia Procedure Notes (Signed)
Arterial Line Insertion Start/End10/12/2021 7:10 AM, 08/31/2022 7:20 AM Performed by: Moshe Salisbury, CRNA  Patient location: Pre-op. Preanesthetic checklist: patient identified, IV checked, risks and benefits discussed, surgical consent, monitors and equipment checked, pre-op evaluation, timeout performed and anesthesia consent Lidocaine 1% used for infiltration Left, radial was placed Catheter size: 20 G Hand hygiene performed , maximum sterile barriers used  and Seldinger technique used  Attempts: 1 Procedure performed without using ultrasound guided technique. Following insertion, dressing applied and Biopatch. Post procedure assessment: normal  Patient tolerated the procedure well with no immediate complications.

## 2022-08-31 NOTE — Anesthesia Preprocedure Evaluation (Signed)
Anesthesia Evaluation  Patient identified by MRN, date of birth, ID band Patient awake    Reviewed: Allergy & Precautions, H&P , NPO status , Patient's Chart, lab work & pertinent test results  Airway Mallampati: II   Neck ROM: full    Dental   Pulmonary shortness of breath, former smoker,    breath sounds clear to auscultation       Cardiovascular hypertension, + DVT   Rhythm:regular Rate:Normal     Neuro/Psych PSYCHIATRIC DISORDERS Bipolar Disorder CVA    GI/Hepatic   Endo/Other  diabetes, Type 2  Renal/GU      Musculoskeletal  (+) Arthritis ,   Abdominal   Peds  Hematology Factor V leiden   Anesthesia Other Findings   Reproductive/Obstetrics                             Anesthesia Physical Anesthesia Plan  ASA: 3  Anesthesia Plan: General   Post-op Pain Management:    Induction: Intravenous  PONV Risk Score and Plan: 2 and Ondansetron, Dexamethasone, Midazolam and Treatment may vary due to age or medical condition  Airway Management Planned: Oral ETT  Additional Equipment: Arterial line  Intra-op Plan:   Post-operative Plan: Extubation in OR  Informed Consent: I have reviewed the patients History and Physical, chart, labs and discussed the procedure including the risks, benefits and alternatives for the proposed anesthesia with the patient or authorized representative who has indicated his/her understanding and acceptance.     Dental advisory given  Plan Discussed with: CRNA, Anesthesiologist and Surgeon  Anesthesia Plan Comments:         Anesthesia Quick Evaluation

## 2022-08-31 NOTE — Interval H&P Note (Signed)
History and Physical Interval Note:  08/31/2022 8:14 AM  [name] Hall  has presented today for surgery, with the diagnosis of Infrarenal abdominal aortic aneurysm without rupture.  The various methods of treatment have been discussed with the patient and family. After consideration of risks, benefits and other options for treatment, the patient has consented to  Procedure(s): ABDOMINAL AORTIC ENDOVASCULAR STENT GRAFT (N/A) as a surgical intervention.  The patient's history has been reviewed, patient examined, no change in status, stable for surgery.  I have reviewed the patient's chart and labs.  Questions were answered to the patient's satisfaction.     Cherre Robins

## 2022-08-31 NOTE — Progress Notes (Signed)
Pt admitted to rm 23 from PACU. Initiated tele. CHG wipe given. Oriented pt to the unit. VSS. Call bell within reach.   Lavenia Atlas, RN

## 2022-08-31 NOTE — Transfer of Care (Signed)
Immediate Anesthesia Transfer of Care Note  Patient: [name] Hall  Procedure(s) Performed: ABDOMINAL AORTIC ENDOVASCULAR STENT GRAFT (Bilateral: Groin)  Patient Location: PACU  Anesthesia Type:General  Level of Consciousness: drowsy and patient cooperative  Airway & Oxygen Therapy: Patient Spontanous Breathing and Patient connected to nasal cannula oxygen  Post-op Assessment: Report given to RN, Post -op Vital signs reviewed and stable and Patient moving all extremities  Post vital signs: Reviewed and stable  Last Vitals:  Vitals Value Taken Time  BP 144/77 08/31/22 1034  Temp    Pulse 64 08/31/22 1036  Resp 12 08/31/22 1036  SpO2 100 % 08/31/22 1036  Vitals shown include unvalidated device data.  Last Pain:  Vitals:   08/31/22 0645  TempSrc:   PainSc: 0-No pain         Complications: No notable events documented.

## 2022-08-31 NOTE — Anesthesia Procedure Notes (Signed)
Procedure Name: Intubation Date/Time: 08/31/2022 7:52 AM  Performed by: Moshe Salisbury, CRNAPre-anesthesia Checklist: Patient identified, Emergency Drugs available, Suction available and Patient being monitored Patient Re-evaluated:Patient Re-evaluated prior to induction Oxygen Delivery Method: Circle System Utilized Preoxygenation: Pre-oxygenation with 100% oxygen Induction Type: IV induction Ventilation: Mask ventilation without difficulty Laryngoscope Size: Mac and 4 Grade View: Grade II Tube type: Oral Tube size: 8.0 mm Number of attempts: 1 Airway Equipment and Method: Stylet Placement Confirmation: ETT inserted through vocal cords under direct vision, positive ETCO2 and breath sounds checked- equal and bilateral Secured at: 23 cm Tube secured with: Tape Dental Injury: Teeth and Oropharynx as per pre-operative assessment

## 2022-08-31 NOTE — Discharge Instructions (Signed)
  [facility] [place]   Discharge Instructions  Endovascular Aortic Aneurysm Repair  Please refer to the following instructions for your post-procedure care. Your surgeon or Physician Assistant will discuss any changes with you.  Activity  You are encouraged to walk as much as you can. You can slowly return to normal activities but must avoid strenuous activity and heavy lifting until your doctor tells you it's OK. Avoid activities such as vacuuming or swinging a gold club. It is normal to feel tired for several weeks after your surgery. Do not drive until your doctor gives the OK and you are no longer taking prescription pain medications. It is also normal to have difficulty with sleep habits, eating, and bowel movements after surgery. These will go away with time.  Bathing/Showering  Shower daily after you go home.  Do not soak in a bathtub, hot tub, or swim until the incision heals completely.  If you have incisions in your groin, wash the groin wounds with soap and water daily and pat dry. (No tub bath-only shower)  Then put a dry gauze or washcloth there to keep this area dry to help prevent wound infection daily and as needed.  Do not use Vaseline or neosporin on your incisions.  Only use soap and water on your incisions and then protect and keep dry.  Incision Care  Shower every day. Clean your incision with mild soap and water. Pat the area dry with a clean towel. You do not need a bandage unless otherwise instructed. Do not apply any ointments or creams to your incision. If you clothing is irritating, you may cover your incision with a dry gauze pad.  Diet  Resume your normal diet. There are no special food restrictions following this procedure. A low fat/low cholesterol diet is recommended for all patients with vascular disease. In order to heal from your surgery, it is CRITICAL to get adequate nutrition. Your body requires vitamins, minerals, and protein.  Vegetables are the best source of vitamins and minerals. Vegetables also provide the perfect balance of protein. Processed food has little nutritional value, so try to avoid this.  Medications  Resume taking all of your medications unless your doctor or nurse practitioner tells you not to. If your incision is causing pain, you may take over-the-counter pain relievers such as acetaminophen (Tylenol). If you were prescribed a stronger pain medication, please be aware these medications can cause nausea and constipation. Prevent nausea by taking the medication with a snack or meal. Avoid constipation by drinking plenty of fluids and eating foods with a high amount of fiber, such as fruits, vegetables, and grains.  Do not take Tylenol if you are taking prescription pain medications.   Follow up  Our office will schedule a follow-up appointment with a CT scan 3-4 weeks after your surgery.  Please call us immediately for any of the following conditions  Severe or worsening pain in your legs or feet or in your abdomen back or chest. Increased pain, redness, drainage (pus) from your incision site. Increased abdominal pain, bloating, nausea, vomiting or persistent diarrhea. Fever of 101 degrees or higher. Swelling in your leg (s),  Reduce your risk of vascular disease  Stop smoking. If you would like help call [facility] at 1-800-QUIT-NOW (1-800-784-8669) or [facility] at [phone]. Manage your cholesterol Maintain a desired weight Control your diabetes Keep your blood pressure down  If you have questions, please call the office at [phone].  

## 2022-08-31 NOTE — Progress Notes (Addendum)
  Day of Surgery Note    Subjective:  says he has some burning around the groin incisions   Vitals:   08/31/22 1135 08/31/22 1145  BP: (!) 116/58   Pulse: 72 73  Resp: 12 12  Temp:    SpO2: 96% 97%    Incisions:   bilateral groins are soft without hematoma Extremities:  easily palpable right DP and left AT pulses Cardiac:  regular Lungs:  non labored Abdomen:  soft, NT   Assessment/Plan:  This is a 76 y.o. male who is s/p  deployment of aortobiiliac endograft and placement of left iliac branch endograft   -pt doing well in recovery room -bilateral groins are soft without hematoma and he has palpable pedal pulses -to 4 east later today and anticipate discharge home tomorrow if no issues overnight.   -most likely can restart coumadin tomorrow if hgb stable and no evidence of bleeding -continue statin -f/u in 4 weeks with Dr. Stanford Breed with CTA c/a/p.  Our office will arrange appt and a message has been sent to the office.  -pain medication changed to Norco due to allergy to Oxycodone.    Leontine Locket, PA-C 08/31/2022 12:03 PM [phone]

## 2022-08-31 NOTE — Op Note (Signed)
DATE OF SERVICE: 08/31/2022  PATIENT:  JIMMY PLESSINGER  76 y.o. 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 My assistant helped with exposure through counter tension, suctioning, ligation and retraction to better visualize the surgical field.  My assistant expedited sewing during the case by following my sutures. Wherever I use the term "we" in the report, my assistant actively helped me with that portion of the procedure.  ANESTHESIA:   general  EBL: <95m  BLOOD ADMINISTERED:none  DRAINS: none   LOCAL MEDICATIONS USED:  NONE  SPECIMEN:  none  COUNTS: confirmed correct.  TOURNIQUET:  none  PATIENT DISPOSITION:  PACU - hemodynamically stable.   ENDOPROSTHESES USED: 31 x 14 x 130 mm Gore thank main body delivery through right common femoral artery access 23 x 12 x 100 mm iliac branch endoprosthesis delivered through the left common femoral artery access 8L x [id number] stent from IBD to left hypogastric artery 27 x 100 mm "bridge" between contralateral limb and iliac branch device delivered through the left common femoral artery access 27 x 10074might iliac extension via the right common femoral artery access  Delay start of Pharmacological VTE agent (>24hrs) due to surgical blood loss or risk of bleeding: no  INDICATION FOR PROCEDURE: GerBURNHAM TROST a 75 22o. male with small abdominal aortic aneurysm, large left common iliac artery aneurysm, small right common iliac artery aneurysm. After careful discussion of risks, benefits, and alternatives the patient was offered EVAR with iliac branch device. The patient understood and wished to proceed.  OPERATIVE FINDINGS: Successful endovascular repair of abdominal aortic and bilateral common iliac artery aneurysms.  1A and 3 endoleak resolved with repeat angioplasty.  The distal right common iliac artery is aneurysmal, and is at risk for degenerating further.  We will monitor this closely going forward.  DESCRIPTION OF PROCEDURE: After identification of the patient in the pre-operative holding area, the patient was transferred to the operating room. The patient was positioned supine on the operating room table. Anesthesia was induced. The abdomen, groins were prepped and draped in standard fashion. A surgical pause was performed confirming correct patient, procedure, and operative location.  Using ultrasound guidance, the right common femoral artery was accessed with micropuncture technique.  A micro sheath was delivered into the common femoral artery.  Bentson wire was navigated into the visceral aorta.  Over the wire an 8 Fre[place]lator was introduced in the common femoral arteriotomy.  11 blade was used to slightly enlarge the incision.  Hemostat was used to create a tract down to the common femoral artery.  Perclose sutures were placed at 10:00 and 2:00 for use at the end of the case.  These were secured with shod clamps.  Using ultrasound guidance, the left common femoral artery was accessed with micropuncture technique.  A micro sheath was delivered into the common femoral artery.  Bentson wire was navigated into the  visceral aorta.  Over the wire an 8 [place] dilator was introduced in the common femoral arteriotomy.  11 blade was used to slightly enlarge the incision.  Hemostat was used to  create a tract down to the common femoral artery.  Perclose sutures were placed at 10:00 and 2:00 for use at the end of the case.  These were secured with shod clamps.  The patient was systemically heparinized.  Activated clotting time measurements were used throughout the case to confirm adequate anticoagulation.  Lunderquist wires were introduced in the descending thoracic aorta.  Access was upsized to 12 [place] on the right and 16 [place] on the left.  A Glidewire was introduced through the right common femoral access and snared in the left common femoral access and externalized.  An iliac branch device was loaded on the 2 wires in the left sided access.  We deliver this into the left common iliac artery and ensure there is no "wire wrap."  The iliac branch device was partially deployed.  I drove the 12 French sheath into the main body of the iliac branch device.  I was able to select the left hypogastric artery through the 12 French sheath fairly easily.  A Berenstein catheter was delivered into the gluteal branch over the wire.  Wire was exchanged for a Biomedical scientist.  An 8L by 79 mm VBX stent was then delivered over the wire bridging the IBD to the left hypogastric artery.  This was deployed without difficulty.  Limited angiography was performed to ensure there is no type Ib or type III leak at this point.  Seeing then we proceeded onward.  The 12 French sheath was brought back into the right common iliac artery.  A Lunderquist wire was navigated into the descending thoracic aorta.  The right sided access was upsized to 18 [place].  The main body of the EVAR was then delivered to the expected position of the lower, left renal artery.  On the left pigtail catheter was delivered into the perivisceral aorta.  Angiography was performed in the takeoff of the left, lower renal artery was made.  The main body was partially deployed.  We did read constrained the device twice to better position the device below the  renal artery and to better position the contralateral gate.  The contralateral gate was selected with a Kumpe catheter and a Glidewire from the left common femoral access..  The wire was navigated into the ascending thoracic aorta.  Over the wire a pigtail catheter was advanced to the main body of the endoprosthesis.  The wire was withdrawn.  The pigtail catheter spun freely in the main body confirming intraluminal position.  A Lunderquist wire was navigated into the descending thoracic aorta.  A "bridge" stent was used to connect to the iliac branch device and the contralateral gate of the main body.  This was deployed without difficulty.  The right common femoral artery access was used to perform a retrograde angiogram and the takeoff of the right hypogastric artery was marked.  A right iliac extension device was deployed without difficulty into the terminal right common iliac artery.  All areas of overlap were ballooned with a molding and occlusion balloon.  Completion angiogram was performed.  A small 1 a endoleak was seen.  This resolved with repeat angioplasty.  After this I noticed a type III endoleak.  The catheter was brought back into the main body of the device and this was confirmed.  I repeat angioplasty of  the overlap segments of the "bridge" stent.  The Endo leak resolved.  The right-sided sheath was withdrawn and manual pressure held on the common femoral arteriotomy.  The previously placed Perclose sutures were secured down.  Hemostasis was achieved.  All endovascular equipment was removed.  The sutures were secured.  Hemostasis was confirmed.  Doppler flow was confirmed in the right foot.  The left-sided sheath was withdrawn and manual pressure held on the common femoral arteriotomy.  The previously placed Perclose sutures were secured down.  Hemostasis was achieved.  All endovascular equipment was removed.  The sutures were secured.  Hemostasis was confirmed.  Doppler flow was confirmed  in the left foot.  The groin incisions were closed with simple Monocryl suture.  Dermabond was applied.  Upon completion of the case instrument and sharps counts were confirmed correct. The patient was transferred to the PACU in good condition. I was present for all portions of the procedure.  Yevonne Aline. Stanford Breed, MD [facility] Phone Number: [phone] 08/31/2022 10:22 AM

## 2022-09-01 ENCOUNTER — Other Ambulatory Visit: Payer: Self-pay

## 2022-09-01 ENCOUNTER — Encounter (HOSPITAL_COMMUNITY): Payer: Self-pay | Admitting: Vascular Surgery

## 2022-09-01 DIAGNOSIS — I7143 Infrarenal abdominal aortic aneurysm, without rupture: Secondary | ICD-10-CM

## 2022-09-01 LAB — CBC
HCT: 34.2 % — ABNORMAL LOW (ref 39.0–52.0)
Hemoglobin: 11.4 g/dL — ABNORMAL LOW (ref 13.0–17.0)
MCH: 30.7 pg (ref 26.0–34.0)
MCHC: 33.3 g/dL (ref 30.0–36.0)
MCV: 92.2 fL (ref 80.0–100.0)
Platelets: 107 10*3/uL — ABNORMAL LOW (ref 150–400)
RBC: 3.71 MIL/uL — ABNORMAL LOW (ref 4.22–5.81)
RDW: 13.2 % (ref 11.5–15.5)
WBC: 10.8 10*3/uL — ABNORMAL HIGH (ref 4.0–10.5)
nRBC: 0 % (ref 0.0–0.2)

## 2022-09-01 LAB — BASIC METABOLIC PANEL
Anion gap: 4 — ABNORMAL LOW (ref 5–15)
BUN: 18 mg/dL (ref 8–23)
CO2: 21 mmol/L — ABNORMAL LOW (ref 22–32)
Calcium: 8.9 mg/dL (ref 8.9–10.3)
Chloride: 108 mmol/L (ref 98–111)
Creatinine, Ser: 1.05 mg/dL (ref 0.61–1.24)
GFR, Estimated: >60 mL/min (ref >60)
Glucose, Bld: 145 mg/dL — ABNORMAL HIGH (ref 70–99)
Potassium: 4.5 mmol/L (ref 3.5–5.1)
Sodium: 133 mmol/L — ABNORMAL LOW (ref 135–145)

## 2022-09-01 MED ORDER — HYDROCODONE-ACETAMINOPHEN 5-325 MG PO TABS: 1.0000 | ORAL_TABLET | Freq: Four times a day (QID) | ORAL | 0 refills | Status: DC | PRN

## 2022-09-01 MED ORDER — WARFARIN SODIUM 5 MG PO TABS: 5.0000 mg | ORAL_TABLET | Freq: Every day | ORAL | Status: DC

## 2022-09-01 NOTE — Anesthesia Postprocedure Evaluation (Signed)
Anesthesia Post Note  Patient: [name] Hall  Procedure(s) Performed: ABDOMINAL AORTIC ENDOVASCULAR STENT GRAFT (Bilateral: Groin)     Patient location during evaluation: PACU Anesthesia Type: General Level of consciousness: awake and alert Pain management: pain level controlled Vital Signs Assessment: post-procedure vital signs reviewed and stable Respiratory status: spontaneous breathing, nonlabored ventilation, respiratory function stable and patient connected to nasal cannula oxygen Cardiovascular status: blood pressure returned to baseline and stable Postop Assessment: no apparent nausea or vomiting Anesthetic complications: no   No notable events documented.  Last Vitals:  Vitals:   09/01/22 0435 09/01/22 0802  BP: 118/65 (!) 109/58  Pulse: 73 77  Resp: 20 18  Temp: 36.8 C 36.7 C  SpO2: 98% 100%    Last Pain:  Vitals:   09/01/22 0802  TempSrc: Oral  PainSc: 0-No pain                 , S

## 2022-09-01 NOTE — Progress Notes (Signed)
Patient discharging home with support from wife. IV removed without complications. Tele removed and CCMD notified. Discharge instructions given and medication administration discussed. All questions answered. Groin sites level 0. SWOT RN taking patient out for discharge.  [place] C Quanika Solem

## 2022-09-01 NOTE — Progress Notes (Addendum)
  Progress Note    09/01/2022 7:37 AM 1 Day Post-Op  Subjective:  no complaints   Vitals:   08/31/22 2335 09/01/22 0435  BP: (!) 118/59 118/65  Pulse: 72 73  Resp: 20 20  Temp: 98.1 F (36.7 C) 98.2 F (36.8 C)  SpO2: 98% 98%   Physical Exam: Lungs:  non labored Incisions:  groin incisions c/d/i Extremities:  symmetrical DP pulses Abdomen:  soft, NT, ND Neurologic: A&O  CBC    Component Value Date/Time   WBC 10.8 (H) 09/01/2022 0500   RBC 3.71 (L) 09/01/2022 0500   HGB 11.4 (L) 09/01/2022 0500   HCT 34.2 (L) 09/01/2022 0500   PLT 107 (L) 09/01/2022 0500   MCV 92.2 09/01/2022 0500   MCH 30.7 09/01/2022 0500   MCHC 33.3 09/01/2022 0500   RDW 13.2 09/01/2022 0500   LYMPHSABS 1.3 10/14/2021 1148   MONOABS 0.8 10/14/2021 1148   EOSABS 0.2 10/14/2021 1148   BASOSABS 0.0 10/14/2021 1148    BMET    Component Value Date/Time   NA 133 (L) 09/01/2022 0500   K 4.5 09/01/2022 0500   CL 108 09/01/2022 0500   CO2 21 (L) 09/01/2022 0500   GLUCOSE 145 (H) 09/01/2022 0500   BUN 18 09/01/2022 0500   CREATININE 1.05 09/01/2022 0500   CALCIUM 8.9 09/01/2022 0500   GFRNONAA >60 09/01/2022 0500   GFRAA >60 01/29/2017 0513    INR    Component Value Date/Time   INR 1.4 (H) 08/31/2022 1100     Intake/Output Summary (Last 24 hours) at 09/01/2022 0737 Last data filed at 09/01/2022 0947 Gross per 24 hour  Intake 2433.14 ml  Output 3005 ml  Net -571.86 ml     Assessment/Plan:  76 y.o. male is s/p EVAR w left iliac branch device 1 Day Post-Op   BLE well perfused with palpable DP pulses B groin incisions without hematoma Ok for discharge home today Office will arrange 1 month CTA a/p    Dagoberto Ligas, PA-C [facility] [phone] 09/01/2022 7:37 AM   VASCULAR STAFF ADDENDUM: I have independently interviewed and examined the patient. I agree with the above.   Yevonne Aline. Stanford Breed, MD [facility]  Phone Number: [phone] 09/01/2022 8:56 AM

## 2022-09-01 NOTE — Progress Notes (Signed)
Mobility Specialist Progress Note:   09/01/22 0848  Mobility  Activity Ambulated with assistance in hallway  Activity Response Tolerated well  Distance Ambulated (ft) 200 ft  $Mobility charge 1 Mobility  Level of Assistance Standby assist, set-up cues, supervision of patient - no hands on  Assistive Device Front wheel walker   Pt received in Bed willing to participate in mobility. No complaints of pain. Pt able to ambulate ~100 ft w/o AD. Left in bed with call bell in reach and all needs met.   [place] [facility] Surveyor, mining Chat only

## 2022-09-07 NOTE — Discharge Summary (Signed)
EVAR Discharge Summary   [name] Hall 21-Sep-1946 76 y.o. male  MRN: [id number]  Admission Date: 08/31/2022  Discharge Date: 09/01/22  Physician: Dr. Stanford Breed  Admission Diagnosis: AAA (abdominal aortic aneurysm) [facility]) [I71.40]  Discharge Day services:    See progress note 09/01/22  Hospital Course:  Mr. [name] Hall is a 76 year old male who was brought in as an outpatient and underwent endovascular repair of abdominal aortic aneurysm with left iliac branch device by Dr. Stanford Breed on 08/31/2022.  He tolerated the procedure well and was admitted to the hospital postoperatively.  POD #1 groin incisions were clean dry and intact without hematoma.  The patient also had symmetrical DP pulses and was ready for discharge home.  He will follow-up in office in 1 month with a CTA of the abdomen and pelvis.  He was prescribed 1 to 2 days of narcotic pain medication for continued postoperative pain control.  He was discharged home in stable condition.  CBC    Component Value Date/Time   WBC 10.8 (H) 09/01/2022 0500   RBC 3.71 (L) 09/01/2022 0500   HGB 11.4 (L) 09/01/2022 0500   HCT 34.2 (L) 09/01/2022 0500   PLT 107 (L) 09/01/2022 0500   MCV 92.2 09/01/2022 0500   MCH 30.7 09/01/2022 0500   MCHC 33.3 09/01/2022 0500   RDW 13.2 09/01/2022 0500   LYMPHSABS 1.3 10/14/2021 1148   MONOABS 0.8 10/14/2021 1148   EOSABS 0.2 10/14/2021 1148   BASOSABS 0.0 10/14/2021 1148    BMET    Component Value Date/Time   NA 133 (L) 09/01/2022 0500   K 4.5 09/01/2022 0500   CL 108 09/01/2022 0500   CO2 21 (L) 09/01/2022 0500   GLUCOSE 145 (H) 09/01/2022 0500   BUN 18 09/01/2022 0500   CREATININE 1.05 09/01/2022 0500   CALCIUM 8.9 09/01/2022 0500   GFRNONAA >60 09/01/2022 0500   GFRAA >60 01/29/2017 0513         Discharge Diagnosis:  AAA (abdominal aortic aneurysm) ([place]) [I71.40]  Secondary Diagnosis: Patient Active Problem List   Diagnosis Date Noted   AAA (abdominal aortic  aneurysm) ([place]) 08/31/2022   Erectile dysfunction 07/18/2020   Failed total hip arthroplasty, 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   Benign essential HTN    DVT (deep venous thrombosis) ([place]) 11/14/2014   Diabetes mellitus type 2 in nonobese ([place]) 11/14/2014   Hypertension 11/14/2014   Concussion 09/03/2014   Closed head injury with concussion 08/31/2014   Syncope and collapse 04/13/2012   Orthostatic hypotension 04/13/2012   Dehydration 04/13/2012   Fall at home 04/13/2012   Hip pain, acute, right 04/13/2012   Alcohol abuse, episodic 12/19/2011   PROSTATE CANCER 09/02/2010   DIABETES, TYPE 2 09/02/2010   Hyperlipidemia 09/02/2010   PERSONAL HISTORY, VENOUS THROMBOSIS AND EMBOLISM 09/02/2010   TOBACCO ABUSE, HX OF 09/02/2010   Past Medical History:  Diagnosis Date   Bipolar disorder ([place])    Diabetes mellitus    type 2   DVT (deep venous thrombosis) ([place]) 2006   leg after joint replacement    Dyspnea    with exertion occ   ED (erectile dysfunction)    Factor 5 Leiden mutation, heterozygous ([place])    Hepatitis    41 yrs ago hepatitis a   Hyperlipidemia    Orthostatic hypotension    Osteoarthritis    PE (pulmonary embolism) 01/27/2017   after right tha revision   PERSONAL HISTORY, VENOUS THROMBOSIS AND EMBOLISM    Prostate CA ([place]) 2012  treated with radiation   PROSTATE CANCER    Stroke ([place]) 15-20 yrs ago, saw on mri   TIA no residual from     Allergies as of 09/01/2022       Reactions   Altace [ramipril] Hives   Duloxetine Swelling   Pregabalin    Other reaction(s): Dizziness (intolerance), Low blood pressure, Other (See Comments), UNSTEADY GAIT   Sulfonamide Derivatives Hives, Itching   Oxycodone Itching, Rash        Medication List     TAKE these medications    acetaminophen 650 MG CR tablet Commonly known as: TYLENOL Take 650 mg by mouth 2 (two) times daily.   atorvastatin 80 MG tablet Commonly known as: LIPITOR Take 80 mg by mouth at bedtime.    benzonatate 100 MG capsule Commonly known as: TESSALON Take 100 mg by mouth daily as needed for cough.   desmopressin 0.1 MG tablet Commonly known as: DDAVP Take 0.3 mg by mouth at bedtime.   fluticasone furoate-vilanterol 100-25 MCG/ACT Aepb Commonly known as: Breo Ellipta Inhale 1 puff into the lungs daily.   HYDROcodone-acetaminophen 5-325 MG tablet Commonly known as: NORCO/VICODIN Take 1 tablet by mouth every 6 (six) hours as needed for moderate pain.   lithium carbonate 150 MG capsule Take 450 mg by mouth 2 (two) times daily with a meal.   metFORMIN 500 MG tablet Commonly known as: GLUCOPHAGE Take 500 mg by mouth daily.   ONE TOUCH ULTRA TEST test strip Generic drug: glucose blood   traZODone 100 MG tablet Commonly known as: DESYREL Take 100 mg by mouth at bedtime.   warfarin 5 MG tablet Commonly known as: COUMADIN Take 1 tablet (5 mg total) by mouth daily. What changed:  medication strength how much to take when to take this       ASK your doctor about these medications    nitrofurantoin (macrocrystal-monohydrate) 100 MG capsule Commonly known as: Macrobid Take 1 capsule (100 mg total) by mouth 2 (two) times daily for 7 days. Ask about: Should I take this medication?        Discharge Instructions:   [facility]  Discharge Instructions Endovascular Aortic Aneurysm Repair  Please refer to the following instructions for your post-procedure care. Your surgeon or Physician Assistant will discuss any changes with you.  Activity  You are encouraged to walk as much as you can. You can slowly return to normal activities but must avoid strenuous activity and heavy lifting until your doctor tells you it's OK. Avoid activities such as vacuuming or swinging a gold club. It is normal to feel tired for several weeks after your surgery. Do not drive until your doctor gives the OK and you are no longer taking prescription pain  medications. It is also normal to have difficulty with sleep habits, eating, and bowel movements after surgery. These will go away with time.  Bathing/Showering  You may shower after you go home. If you have an incision, do not soak in a bathtub, hot tub, or swim until the incision heals completely.  Incision Care  Shower every day. Clean your incision with mild soap and water. Pat the area dry with a clean towel. You do not need a bandage unless otherwise instructed. Do not apply any ointments or creams to your incision. If you clothing is irritating, you may cover your incision with a dry gauze pad.  Diet  Resume your normal diet. There are no special food restrictions following this  procedure. A low fat/low cholesterol diet is recommended for all patients with vascular disease. In order to heal from your surgery, it is CRITICAL to get adequate nutrition. Your body requires vitamins, minerals, and protein. Vegetables are the best source of vitamins and minerals. Vegetables also provide the perfect balance of protein. Processed food has little nutritional value, so try to avoid this.  Medications  Resume taking all of your medications unless your doctor or Physician Assistnat tells you not to. If your incision is causing pain, you may take over-the-counter pain relievers such as acetaminophen (Tylenol). If you were prescribed a stronger pain medication, please be aware these medications can cause nausea and constipation. Prevent nausea by taking the medication with a snack or meal. Avoid constipation by drinking plenty of fluids and eating foods with a high amount of fiber, such as fruits, vegetables, and grains. Do not take Tylenol if you are taking prescription pain medications.   Follow up  [place] office will schedule a follow-up appointment with a C.T. scan 3-4 weeks after your surgery.  Please call us immediately for any of the following conditions  Severe or worsening pain in your legs  or feet or in your abdomen back or chest. Increased pain, redness, drainage (pus) from your incision sit. Increased abdominal pain, bloating, nausea, vomiting or persistent diarrhea. Fever of 101 degrees or higher. Swelling in your leg (s),  Reduce your risk of vascular disease  Stop smoking. If you would like help call [facility] at 1-800-QUIT-NOW (364)850-5356) or [place] at [phone]. Manage your cholesterol Maintain a desired weight Control your diabetes Keep your blood pressure down  If you have questions, please call the office at [phone].     Disposition: home  Patient's condition: is Good  Follow up: 1. Dr. Stanford Breed in 4 weeks with CTA protocol   Dagoberto Ligas, PA-C [facility] [phone] 09/07/2022  7:38 AM   - For VQI Registry use - Post-op:  Time to Extubation: '[x]'$  In OR, '[ ]'$  < 12 hrs, '[ ]'$  12-24 hrs, '[ ]'$  >=24 hrs Vasopressors Req. Post-op: No MI: No., '[ ]'$  Troponin only, '[ ]'$  EKG or Clinical New Arrhythmia: No CHF: No ICU Stay: 0 days Transfusion: No   Complications: Resp failure: No., '[ ]'$  Pneumonia, '[ ]'$  Ventilator Chg in renal function: No., '[ ]'$  Inc. Cr > 0.5, '[ ]'$  Temp. Dialysis,  '[ ]'$  Permanent dialysis Leg ischemia: No., no Surgery needed, '[ ]'$  Yes, Surgery needed,  '[ ]'$  Amputation Bowel ischemia: No., '[ ]'$  Medical Rx, '[ ]'$  Surgical Rx Wound complication: No., '[ ]'$  Superficial separation/infection, '[ ]'$  Return to OR Return to OR: No  Return to OR for bleeding: No Stroke: No., '[ ]'$  Minor, '[ ]'$  Major  Discharge medications: Statin use:  Yes  ASA use:  No, warfarin   Plavix use:  No  Beta blocker use:  No  ARB use:  No ACEI use:  No CCB use:  No

## 2022-09-14 ENCOUNTER — Telehealth: Payer: Self-pay

## 2022-09-14 NOTE — Telephone Encounter (Signed)
Pt called c/o lower back pain and wanted to know if that was normal.  Reviewed pt's chart, returned call for clarification, two identifiers used. Pt stated he has chronic lower back pain and this was slightly more noticeable. He states that when he wakes or begins to ambulate after sitting, the pain is 9/10, but then decreases to a 4-5/10 once he's moving. Informed him that it could be from the surgery, but if he had significant long-lasting pain, he should seek immediate care. Confirmed understanding.

## 2022-09-19 DIAGNOSIS — R350 Frequency of micturition: Secondary | ICD-10-CM | POA: Diagnosis not present

## 2022-09-19 DIAGNOSIS — N3000 Acute cystitis without hematuria: Secondary | ICD-10-CM | POA: Diagnosis not present

## 2022-09-19 DIAGNOSIS — E119 Type 2 diabetes mellitus without complications: Secondary | ICD-10-CM | POA: Diagnosis not present

## 2022-09-22 DIAGNOSIS — M545 Low back pain, unspecified: Secondary | ICD-10-CM | POA: Diagnosis not present

## 2022-09-24 ENCOUNTER — Other Ambulatory Visit: Payer: Self-pay | Admitting: Family Medicine

## 2022-09-24 DIAGNOSIS — R1030 Lower abdominal pain, unspecified: Secondary | ICD-10-CM | POA: Diagnosis not present

## 2022-09-24 DIAGNOSIS — R1031 Right lower quadrant pain: Secondary | ICD-10-CM

## 2022-09-28 ENCOUNTER — Ambulatory Visit
Admission: RE | Admit: 2022-09-28 | Discharge: 2022-09-28 | Disposition: A | Discharge status: Home / Self Care | Payer: No Typology Code available for payment source | Source: Ambulatory Visit | Attending: Family Medicine | Admitting: Family Medicine

## 2022-09-28 ENCOUNTER — Ambulatory Visit
Admission: RE | Admit: 2022-09-28 | Discharge: 2022-09-28 | Disposition: A | Discharge status: Home / Self Care | Payer: No Typology Code available for payment source | Source: Ambulatory Visit | Attending: Vascular Surgery | Admitting: Vascular Surgery

## 2022-09-28 DIAGNOSIS — I7143 Infrarenal abdominal aortic aneurysm, without rupture: Secondary | ICD-10-CM

## 2022-09-28 DIAGNOSIS — R1031 Right lower quadrant pain: Secondary | ICD-10-CM

## 2022-09-28 MED ORDER — IOPAMIDOL (ISOVUE-370) INJECTION 76%
75.0000 mL | Freq: Once | INTRAVENOUS | Status: AC | PRN
  Administered 2022-09-28: 75 mL via INTRAVENOUS

## 2022-09-28 NOTE — Progress Notes (Unsigned)
[facility] [place]  ASSESSMENT / PLAN: [name] Hall is a 76 y.o. 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   Recommend the following to reduce the risk of major adverse cardiac / limb events.  Complete cessation from all tobacco products. Blood glucose control with goal A1c < 7%. Blood pressure control with goal blood pressure < 140/90 mmHg. Lipid reduction therapy with goal LDL-C <100 mg/dL. Aspirin '81mg'$  PO QD.  Atorvastatin 40-'80mg'$  PO QD (or other "high intensity" statin therapy).  Good technical result noted on CT angiogram. Follow up annually with duplex of AAA.   CHIEF COMPLAINT: Aneurysm  HISTORY OF PRESENT ILLNESS: [name] Hall is a 76 y.o. male referred to clinic for evaluation of enlarging abdominal aortic aneurysm and common iliac artery aneurysm.  The patient has been followed by the [facility] in [place] for the same.  He is asymptomatic from a aneurysm standpoint.  He reports history of smoking, but no family history of aneurysm disease, or connective tissue disease.  The bulk of our visit was spent reviewing the rationale for intervention, and the different options available for intervention.  09/29/22: Patient returns after EVAR with iliac branch device.  He tolerated this very well.  He does report some left groin pain, which is being worked up for possible hernia.  VASCULAR SURGICAL HISTORY: None  VASCULAR RISK FACTORS: Positive history of stroke / transient ischemic attack. Negative history of coronary artery disease.  Negative history of diabetes mellitus. Positive history of smoking. Not actively smoking. Positive history of hypertension.  Negative history of chronic kidney disease.   Negative history of chronic obstructive pulmonary disease.  FUNCTIONAL STATUS: ECOG performance status: (0) Fully active, able to carry on all predisease  performance without restriction Ambulatory status: Ambulatory within the community without limits  [name] Hall 1 AND 3 YEAR INDEX Male (2pts) 75-79 or 80-84 (2pts) >84 (3pts) Dependence in toileting (1pt) Partial or full dependence in dressing (1pt) History of malignant neoplasm (2pts) CHF (3pts) COPD (1pts) CKD (3pts)  0-3 pts 6% 1 year mortality ; 21% 3 year mortality 4-5 pts 12% 1 year mortality ; 36% 3 year mortality >5 pts 21% 1 year mortality; 54% 3 year mortality   Past Medical History:  Diagnosis Date   Bipolar disorder ([place])    Diabetes mellitus    type 2   DVT (deep venous thrombosis) ([place]) 2006   leg after joint replacement    Dyspnea    with exertion occ   ED (erectile dysfunction)    Factor 5 Leiden mutation, heterozygous ([place])    Hepatitis    41 yrs ago hepatitis a   Hyperlipidemia    Orthostatic hypotension    Osteoarthritis    PE (pulmonary embolism) 01/27/2017   after right tha revision   PERSONAL HISTORY, VENOUS THROMBOSIS AND EMBOLISM    Prostate CA ([place]) 2012   treated with radiation   PROSTATE CANCER    Stroke ([place]) 15-20 yrs ago, saw on mri   TIA no residual from    Past Surgical History:  Procedure Laterality Date   ABDOMINAL AORTIC ENDOVASCULAR STENT GRAFT Bilateral 08/31/2022   Procedure: ABDOMINAL AORTIC ENDOVASCULAR STENT GRAFT;  Surgeon: [name] Robins, MD;  [facility];  Service: Vascular;  Laterality: Bilateral;   CATARACT EXTRACTION Bilateral    colonoscopy     polyp located and tested negative   COLONOSCOPY WITH  PROPOFOL  10/2003, [id number]   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   Procedure: PENILE PROTHESIS INFLATABLE;  Surgeon: Franchot Gallo, MD;  Location: [facility];  Service: Urology;  Laterality: N/A;   TONSILLECTOMY AND ADENOIDECTOMY     AS A CHILD   TOTAL HIP ARTHROPLASTY     Left and right hip replacements   TOTAL HIP REVISION Right 01/27/2017   Procedure: RIGHT TOTAL HIP REVISION;  Surgeon:  Gaynelle Arabian, MD;  Location: [facility] ORS;  Service: Orthopedics;  Laterality: Right;    Family History  Problem Relation Age of Onset   Hyperlipidemia Mother    Hypertension Mother    Heart attack Father    Hyperlipidemia Father    Hypertension Father    Sudden death Father    Depression Sister     Social History   Socioeconomic History   Marital status: Married    Spouse name: Not on file   Number of children: Not on file   Years of education: Not on file   Highest education level: Not on file  Occupational History   Not on file  Tobacco Use   Smoking status: Former    Packs/day: 1.00    Years: 30.00    Total pack years: 30.00    Types: Cigarettes    Quit date: 02/22/2014    Years since quitting: 8.6   Smokeless tobacco: Never  Vaping Use   Vaping Use: Never used  Substance and Sexual Activity   Alcohol use: No   Drug use: No   Sexual activity: Never  Other Topics Concern   Not on file  Social History Narrative   Not on file   Social Determinants of Health   Financial Resource Strain: Not on file  Food Insecurity: Not on file  Transportation Needs: Not on file  Physical Activity: Not on file  Stress: Not on file  Social Connections: Not on file  Intimate Partner Violence: Not on file    Allergies  Allergen Reactions   Altace [Ramipril] Hives   Duloxetine Swelling   Pregabalin     Other reaction(s): Dizziness (intolerance), Low blood pressure, Other (See Comments), UNSTEADY GAIT   Sulfonamide Derivatives Hives and Itching   Oxycodone Itching and Rash    Current Outpatient Medications  Medication Sig Dispense Refill   acetaminophen (TYLENOL) 650 MG CR tablet Take 650 mg by mouth 2 (two) times daily.     atorvastatin (LIPITOR) 80 MG tablet Take 80 mg by mouth at bedtime.     benzonatate (TESSALON) 100 MG capsule Take 100 mg by mouth daily as needed for cough.     desmopressin (DDAVP) 0.1 MG tablet Take 0.3 mg by mouth at bedtime.     fluticasone  furoate-vilanterol (BREO ELLIPTA) 100-25 MCG/ACT AEPB Inhale 1 puff into the lungs daily. 28 each 6   HYDROcodone-acetaminophen (NORCO/VICODIN) 5-325 MG tablet Take 1 tablet by mouth every 6 (six) hours as needed for moderate pain. 15 tablet 0   lithium carbonate 150 MG capsule Take 450 mg by mouth 2 (two) times daily with a meal.     metFORMIN (GLUCOPHAGE) 500 MG tablet Take 500 mg by mouth daily.      ONE TOUCH ULTRA TEST test strip      traZODone (DESYREL) 100 MG tablet Take 100 mg by mouth at bedtime.     warfarin (COUMADIN) 5 MG tablet Take 1 tablet (5 mg total) by mouth daily.     No current facility-administered medications for this  visit.    PHYSICAL EXAM There were no vitals filed for this visit.   Well-appearing gentleman in no acute distress Regular rate and rhythm Unlabored breathing Nondistended abdomen Access sites clean and dry without evidence of pseudoaneurysm.  Easily palpable femoral pulses.  PERTINENT LABORATORY AND RADIOLOGIC DATA  Most recent CBC    Latest Ref Rng & Units 09/01/2022    5:00 AM 08/31/2022   11:00 AM 08/27/2022    1:51 PM  CBC  WBC 4.0 - 10.5 K/uL 10.8  5.7  7.9   Hemoglobin 13.0 - 17.0 g/dL 11.4  11.7  14.3   Hematocrit 39.0 - 52.0 % 34.2  37.0  43.7   Platelets 150 - 400 K/uL 107  90  147      Most recent CMP    Latest Ref Rng & Units 09/01/2022    5:00 AM 08/31/2022   11:00 AM 08/27/2022    1:51 PM  CMP  Glucose 70 - 99 mg/dL 145  103  98   BUN 8 - 23 mg/dL '18  18  21   '$ Creatinine 0.61 - 1.24 mg/dL 1.05  1.24  1.32   Sodium 135 - 145 mmol/L 133  136  139   Potassium 3.5 - 5.1 mmol/L 4.5  4.3  4.3   Chloride 98 - 111 mmol/L 108  110  106   CO2 22 - 32 mmol/L '21  22  24   '$ Calcium 8.9 - 10.3 mg/dL 8.9  8.5  9.8   Total Protein 6.5 - 8.1 g/dL   7.4   Total Bilirubin 0.3 - 1.2 mg/dL   1.2   Alkaline Phos 38 - 126 U/L   105   AST 15 - 41 U/L   20   ALT 0 - 44 U/L   17     Renal function CrCl cannot be calculated (Patient's most  recent lab result is older than the maximum 21 days allowed.).  Hgb A1c MFr Bld (%)  Date Value  01/20/2017 5.6    LDL Cholesterol  Date Value Ref Range Status  12/23/2007   Final   91        Total Cholesterol/HDL:CHD Risk Coronary Heart Disease Risk Table                     Men   Women  1/2 Average Risk   3.4   3.3    Postoperative CT angiogram shows good technical result of EVAR with IBD.  No evidence of endoleak.  Yevonne Aline. Stanford Breed, MD [facility] Phone Number: [phone] 09/28/2022 6:14 PM  Total time spent on preparing this encounter including chart review, data review, collecting history, examining the patient, coordinating care for this patient, 20 minutes.  Portions of this report may have been transcribed using voice recognition software.  Every effort has been made to ensure accuracy; however, inadvertent computerized transcription errors may still be present.

## 2022-09-29 ENCOUNTER — Ambulatory Visit (INDEPENDENT_AMBULATORY_CARE_PROVIDER_SITE_OTHER): Payer: No Typology Code available for payment source | Admitting: Vascular Surgery

## 2022-09-29 ENCOUNTER — Encounter: Payer: Self-pay | Admitting: Vascular Surgery

## 2022-09-29 VITALS — BP 96/55 | HR 66 | Temp 98.1°F | Resp 20 | Ht 71.0 in | Wt 203.8 lb

## 2022-09-29 DIAGNOSIS — Z9889 Other specified postprocedural states: Secondary | ICD-10-CM

## 2022-09-29 DIAGNOSIS — Z8679 Personal history of other diseases of the circulatory system: Secondary | ICD-10-CM

## 2022-09-30 DIAGNOSIS — Z09 Encounter for follow-up examination after completed treatment for conditions other than malignant neoplasm: Secondary | ICD-10-CM | POA: Diagnosis not present

## 2022-09-30 DIAGNOSIS — R1030 Lower abdominal pain, unspecified: Secondary | ICD-10-CM | POA: Diagnosis not present

## 2022-09-30 DIAGNOSIS — E1169 Type 2 diabetes mellitus with other specified complication: Secondary | ICD-10-CM | POA: Diagnosis not present

## 2022-10-01 DIAGNOSIS — N1831 Chronic kidney disease, stage 3a: Secondary | ICD-10-CM | POA: Diagnosis not present

## 2022-10-01 DIAGNOSIS — R300 Dysuria: Secondary | ICD-10-CM | POA: Diagnosis not present

## 2022-10-01 DIAGNOSIS — N390 Urinary tract infection, site not specified: Secondary | ICD-10-CM | POA: Diagnosis not present

## 2022-10-05 DIAGNOSIS — M47816 Spondylosis without myelopathy or radiculopathy, lumbar region: Secondary | ICD-10-CM | POA: Diagnosis not present

## 2022-10-06 DIAGNOSIS — R051 Acute cough: Secondary | ICD-10-CM | POA: Diagnosis not present

## 2022-10-19 DIAGNOSIS — N390 Urinary tract infection, site not specified: Secondary | ICD-10-CM | POA: Diagnosis not present

## 2022-10-19 DIAGNOSIS — R350 Frequency of micturition: Secondary | ICD-10-CM | POA: Diagnosis not present

## 2022-10-20 DIAGNOSIS — M47816 Spondylosis without myelopathy or radiculopathy, lumbar region: Secondary | ICD-10-CM | POA: Diagnosis not present

## 2022-10-21 ENCOUNTER — Other Ambulatory Visit: Payer: Self-pay

## 2022-10-21 ENCOUNTER — Emergency Department (HOSPITAL_COMMUNITY)
Admission: EM | Admit: 2022-10-21 | Discharge: 2022-10-21 | Disposition: A | Discharge status: Home / Self Care | Payer: No Typology Code available for payment source | Attending: Emergency Medicine | Admitting: Emergency Medicine

## 2022-10-21 ENCOUNTER — Emergency Department (HOSPITAL_COMMUNITY): Payer: No Typology Code available for payment source

## 2022-10-21 ENCOUNTER — Encounter (HOSPITAL_COMMUNITY): Payer: Self-pay

## 2022-10-21 DIAGNOSIS — R350 Frequency of micturition: Secondary | ICD-10-CM | POA: Diagnosis present

## 2022-10-21 DIAGNOSIS — N3000 Acute cystitis without hematuria: Secondary | ICD-10-CM | POA: Diagnosis not present

## 2022-10-21 DIAGNOSIS — Z7901 Long term (current) use of anticoagulants: Secondary | ICD-10-CM | POA: Insufficient documentation

## 2022-10-21 DIAGNOSIS — Z7984 Long term (current) use of oral hypoglycemic drugs: Secondary | ICD-10-CM | POA: Diagnosis not present

## 2022-10-21 DIAGNOSIS — Z8546 Personal history of malignant neoplasm of prostate: Secondary | ICD-10-CM | POA: Insufficient documentation

## 2022-10-21 DIAGNOSIS — E119 Type 2 diabetes mellitus without complications: Secondary | ICD-10-CM | POA: Insufficient documentation

## 2022-10-21 LAB — URINALYSIS, ROUTINE W REFLEX MICROSCOPIC
Bilirubin Urine: NEGATIVE
Glucose, UA: NEGATIVE mg/dL
Ketones, ur: NEGATIVE mg/dL
Nitrite: POSITIVE — AB
Protein, ur: NEGATIVE mg/dL
Specific Gravity, Urine: 1.013 (ref 1.005–1.030)
WBC, UA: >50 WBC/hpf — ABNORMAL HIGH (ref 0–5)
pH: 7 (ref 5.0–8.0)

## 2022-10-21 LAB — CBC WITH DIFFERENTIAL/PLATELET
Abs Immature Granulocytes: 0.02 10*3/uL (ref 0.00–0.07)
Basophils Absolute: 0.1 10*3/uL (ref 0.0–0.1)
Basophils Relative: 1 %
Eosinophils Absolute: 0.3 10*3/uL (ref 0.0–0.5)
Eosinophils Relative: 4 %
HCT: 40.4 % (ref 39.0–52.0)
Hemoglobin: 12.4 g/dL — ABNORMAL LOW (ref 13.0–17.0)
Immature Granulocytes: 0 %
Lymphocytes Relative: 18 %
Lymphs Abs: 1.4 10*3/uL (ref 0.7–4.0)
MCH: 30 pg (ref 26.0–34.0)
MCHC: 30.7 g/dL (ref 30.0–36.0)
MCV: 97.8 fL (ref 80.0–100.0)
Monocytes Absolute: 0.8 10*3/uL (ref 0.1–1.0)
Monocytes Relative: 10 %
Neutro Abs: 5.5 10*3/uL (ref 1.7–7.7)
Neutrophils Relative %: 67 %
Platelets: 172 10*3/uL (ref 150–400)
RBC: 4.13 MIL/uL — ABNORMAL LOW (ref 4.22–5.81)
RDW: 14.1 % (ref 11.5–15.5)
WBC: 8.1 10*3/uL (ref 4.0–10.5)
nRBC: 0 % (ref 0.0–0.2)

## 2022-10-21 LAB — COMPREHENSIVE METABOLIC PANEL
ALT: 14 U/L (ref 0–44)
AST: 18 U/L (ref 15–41)
Albumin: 3.8 g/dL (ref 3.5–5.0)
Alkaline Phosphatase: 111 U/L (ref 38–126)
Anion gap: 4 — ABNORMAL LOW (ref 5–15)
BUN: 20 mg/dL (ref 8–23)
CO2: 26 mmol/L (ref 22–32)
Calcium: 9.7 mg/dL (ref 8.9–10.3)
Chloride: 107 mmol/L (ref 98–111)
Creatinine, Ser: 1.33 mg/dL — ABNORMAL HIGH (ref 0.61–1.24)
GFR, Estimated: 55 mL/min — ABNORMAL LOW (ref >60)
Glucose, Bld: 89 mg/dL (ref 70–99)
Potassium: 4.2 mmol/L (ref 3.5–5.1)
Sodium: 137 mmol/L (ref 135–145)
Total Bilirubin: 0.7 mg/dL (ref 0.3–1.2)
Total Protein: 7.5 g/dL (ref 6.5–8.1)

## 2022-10-21 MED ORDER — IOHEXOL 300 MG/ML  SOLN
100.0000 mL | Freq: Once | INTRAMUSCULAR | Status: AC | PRN
  Administered 2022-10-21: 100 mL via INTRAVENOUS

## 2022-10-21 MED ORDER — CEPHALEXIN 500 MG PO CAPS: 500.0000 mg | ORAL_CAPSULE | Freq: Four times a day (QID) | ORAL | 0 refills | Status: DC

## 2022-10-21 NOTE — ED Triage Notes (Signed)
C/o urinary frequency and burning x1 day pt reports "concentrated urine and unable to get good flow now" Treated x3 since 9/23 for UTI.  Seen MD on Monday and - for UTI Pt reports on coumadin.

## 2022-10-21 NOTE — ED Provider Triage Note (Signed)
Emergency Medicine Provider Triage Evaluation Note  [name] Hall , a 76 y.o. male  was evaluated in triage.  Pt complains of dysuria, increased frequency.  Patient has a history of prostate cancer status post radiation.  Patient states that at the end of September he began having a lot of dysuria and increased frequency.  He states that he was diagnosed with UTI on 3 different occasions.  He has been on antibiotics.  Most recent culture was negative.  His symptoms have continued and have been much worse over the past 2 days.  He is being set up with an appointment with urology, but has not yet seen one.  He is requesting imaging today.  Review of Systems  Positive: Dysuria, suprapubic pain Negative: Vomiting, fever  Physical Exam  BP 104/63 (BP Location: Left Arm)   Pulse 62   Temp 97.6 F (36.4 C) (Oral)   Resp 18   Ht '5\' 11"'$  (1.803 m)   Wt 92 kg   SpO2 99%   BMI 28.29 kg/m  Gen:   Awake, no distress   Resp:  Normal effort  MSK:   Moves extremities without difficulty  Other:  Mild suprapubic tenderness  Medical Decision Making  Medically screening exam initiated at 12:52 PM.  Appropriate orders placed.  [name] Hall was informed that the remainder of the evaluation will be completed by another provider, this initial triage assessment does not replace that evaluation, and the importance of remaining in the ED until their evaluation is complete.     Carlisle Cater, PA-C 10/21/22 1255

## 2022-10-21 NOTE — ED Provider Notes (Signed)
[facility] DEPT Provider Note   CSN: [id number] Arrival date & time: 10/21/22  1208     History  Chief Complaint  Patient presents with   Urinary Frequency    [name] Hall is a 76 y.o. male.   Urinary Frequency  Patient presents with dysuria and urinary frequency.  Began today.  However over the last 2 to 3 months has had dysuria and urinary frequency.  Has been on antibiotics with Macrobid twice what sounds like Keflex once.  Recently just finished up Keflex.  Went to PCP on Monday with today being Wednesday.  Reportedly had negative urine culture at that time.  I reviewed the culture reports and was negative.  However today began to have dysuria again.  Has been scheduled to see urology but is not seen them yet.  No fevers.    Past Medical History:  Diagnosis Date   Bipolar disorder ([place])    Diabetes mellitus    type 2   DVT (deep venous thrombosis) ([place]) 2006   leg after joint replacement    Dyspnea    with exertion occ   ED (erectile dysfunction)    Factor 5 Leiden mutation, heterozygous ([place])    Hepatitis    41 yrs ago hepatitis a   Hyperlipidemia    Orthostatic hypotension    Osteoarthritis    PE (pulmonary embolism) 01/27/2017   after right tha revision   PERSONAL HISTORY, VENOUS THROMBOSIS AND EMBOLISM    Prostate CA ([place]) 2012   treated with radiation   PROSTATE CANCER    Stroke ([place]) 15-20 yrs ago, saw on mri   TIA no residual from    Home Medications Prior to Admission medications   Medication Sig Start Date End Date Taking? Authorizing Provider  cephALEXin (KEFLEX) 500 MG capsule Take 1 capsule (500 mg total) by mouth 4 (four) times daily. 10/21/22  Yes Davonna Belling, MD  acetaminophen (TYLENOL) 650 MG CR tablet Take 650 mg by mouth 2 (two) times daily.    [provider]  atorvastatin (LIPITOR) 80 MG tablet Take 80 mg by mouth at bedtime.    [provider]  desmopressin (DDAVP) 0.1 MG tablet  Take 0.3 mg by mouth at bedtime. 12/14/17   [provider]  lithium carbonate 150 MG capsule Take 450 mg by mouth 2 (two) times daily with a meal.    [provider]  metFORMIN (GLUCOPHAGE) 500 MG tablet Take 500 mg by mouth daily.     [provider]  ONE TOUCH ULTRA TEST test strip  04/29/17   [provider]  traZODone (DESYREL) 100 MG tablet Take 100 mg by mouth at bedtime.    [provider]  warfarin (COUMADIN) 5 MG tablet Take 1 tablet (5 mg total) by mouth daily. 09/01/22   Cherre Robins, MD      Allergies    Altace [ramipril], Duloxetine, Pregabalin, Sulfonamide derivatives, and Oxycodone    Review of Systems   Review of Systems  Genitourinary:  Positive for frequency.    Physical Exam Updated Vital Signs BP 124/61   Pulse 72   Temp 97.6 F (36.4 C) (Oral)   Resp 18   Ht '5\' 11"'$  (1.803 m)   Wt 92 kg   SpO2 99%   BMI 28.29 kg/m  Physical Exam Vitals and nursing note reviewed.  Cardiovascular:     Rate and Rhythm: Normal rate.  Pulmonary:     Breath sounds: No wheezing.  Abdominal:     Tenderness: There is no abdominal tenderness.  Musculoskeletal:        General: No swelling.  Neurological:     Mental Status: He is alert.     ED Results / Procedures / Treatments   Labs (all labs ordered are listed, but only abnormal results are displayed) Labs Reviewed  CBC WITH DIFFERENTIAL/PLATELET - Abnormal; Notable for the following components:      Result Value   RBC 4.13 (*)    Hemoglobin 12.4 (*)    All other components within normal limits  COMPREHENSIVE METABOLIC PANEL - Abnormal; Notable for the following components:   Creatinine, Ser 1.33 (*)    GFR, Estimated 55 (*)    Anion gap 4 (*)    All other components within normal limits  URINALYSIS, ROUTINE W REFLEX MICROSCOPIC - Abnormal; Notable for the following components:   APPearance CLOUDY (*)    Hgb urine dipstick SMALL (*)    Nitrite POSITIVE (*)     Leukocytes,Ua LARGE (*)    WBC, UA >50 (*)    Bacteria, UA RARE (*)    All other components within normal limits  URINE CULTURE    EKG None  Radiology CT ABDOMEN PELVIS W CONTRAST  Result Date: 10/21/2022 CLINICAL DATA:  Abdominal pain. Recurrent UTI symptoms over the past 2 months. EXAM: CT ABDOMEN AND PELVIS WITH CONTRAST TECHNIQUE: Multidetector CT imaging of the abdomen and pelvis was performed using the standard protocol following bolus administration of intravenous contrast. RADIATION DOSE REDUCTION: This exam was performed according to the departmental dose-optimization program which includes automated exposure control, adjustment of the mA and/or kV according to patient size and/or use of iterative reconstruction technique. CONTRAST:  114m OMNIPAQUE IOHEXOL 300 MG/ML  SOLN COMPARISON:  CT abdomen/pelvis 09/28/2022 FINDINGS: Lower chest: The lung bases are clear. Coronary artery calcifications are noted. The imaged heart is otherwise unremarkable. Hepatobiliary: The liver is unremarkable. There is a tiny gallstone without evidence of acute cholecystitis. There is no biliary ductal dilatation. Pancreas: Unremarkable. Spleen: Unremarkable. Adrenals/Urinary Tract: The adrenals are unremarkable. The kidneys enhance symmetrically. There is no focal lesion, stone, hydronephrosis, or hydroureter. There is symmetric excretion of contrast into the collecting systems on the delayed images. There is mild nonspecific perinephric stranding bilaterally. The bladder is largely obscured by streak artifact from the hip arthroplasty hardware but there is apparent wall thickening and mucosal hyperemia (5-80). Stomach/Bowel: The stomach is unremarkable. There is no evidence of bowel obstruction. There is no abnormal bowel wall thickening or inflammatory change there is colonic diverticulosis without evidence of acute diverticulitis. 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 The excluded aneurysm measuring up to 4.1 cm x 4.0 cm is stable to slightly decreased in size since 09/28/2022 (2-37). 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 These findings were better assessed on the recent CTA. There is no abdominal or pelvic lymphadenopathy. Reproductive: Presumed brachytherapy beads are again seen in the prostate. A penile prosthesis and associated reservoir are unchanged. Other: There is no ascites or free air. Musculoskeletal: Postsurgical changes reflecting bilateral hip arthroplasty are again seen. There is no acute osseous abnormality or suspicious osseous lesion. IMPRESSION: 1. Apparent wall thickening and mucosal hyperemia of the bladder may reflect cystitis. 2. No other acute findings in the abdomen or pelvis.  No evidence of pyelonephritis. 3. Cholelithiasis without evidence of acute cholecystitis. 4. Diverticulosis without evidence of acute diverticulitis. 5. 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 Electronically Signed   By: Valetta Mole M.D.   On: 10/21/2022 16:21    Procedures Procedures    Medications Ordered in ED Medications  iohexol (OMNIPAQUE) 300 MG/ML solution 100 mL (100 mLs Intravenous Contrast Given 10/21/22 1550)    ED Course/ Medical Decision Making/ A&P                           Medical Decision Making Risk Prescription drug management.   Patient with dysuria.  Recently treated for UTI.  Had negative culture 2 days ago but now having symptoms again.  Urine here interpreted and appears to show likely infection.  Cultures been sent.  CT scan done to look for other pathology and  does shows a likely cystitis as a cause.  Informed of other findings such as diverticulitis and cholelithiasis but I think these are incidental.  We will treat with antibiotics.  Has had previous prostatectomy.  Will have follow-up with urology.  Culture sent to both verify infection and antibiotic coverage.  I think more likely with nitrite it is an infection as opposed to just a noninfectious cystitis.  Well-appearing and appears stable for discharge home.        Final Clinical Impression(s) / ED Diagnoses Final diagnoses:  Acute cystitis without hematuria    Rx / DC Orders ED Discharge Orders          Ordered    cephALEXin (KEFLEX) 500 MG capsule  4 times daily        10/21/22 1703              Davonna Belling, MD 10/21/22 [phone]

## 2022-10-23 LAB — URINE CULTURE: Culture: >=100000 — AB

## 2022-10-24 ENCOUNTER — Telehealth (HOSPITAL_BASED_OUTPATIENT_CLINIC_OR_DEPARTMENT_OTHER): Payer: Self-pay | Admitting: *Deleted

## 2022-10-24 NOTE — Telephone Encounter (Signed)
Post ED Visit - Positive Culture Follow-up  Culture report reviewed by antimicrobial stewardship pharmacist: [place] [facility] Team '[]'$  Elenor Quinones, Pharm.D. '[]'$  Heide Guile, Pharm.D., BCPS AQ-ID '[]'$  Parks Neptune, Pharm.D., BCPS '[]'$  Alycia Rossetti, Pharm.D., BCPS '[]'$  Annetta North, Pharm.D., BCPS, AAHIVP '[]'$  Legrand Como, Pharm.D., BCPS, AAHIVP '[]'$  Salome Arnt, PharmD, BCPS '[]'$  Johnnette Gourd, PharmD, BCPS '[]'$  Hughes Better, PharmD, BCPS '[]'$  Leeroy Cha, PharmD '[]'$  Laqueta Linden, PharmD, BCPS '[]'$  Albertina Parr, PharmD  [facility] Team '[]'$  Leodis Sias, PharmD '[]'$  Lindell Spar, PharmD '[]'$  Royetta Asal, PharmD '[]'$  Graylin Shiver, Rph '[]'$  Rema Fendt) Glennon Mac, PharmD '[]'$  Arlyn Dunning, PharmD '[]'$  Netta Cedars, PharmD '[]'$  Dia Sitter, PharmD '[]'$  Leone Haven, PharmD '[]'$  Gretta Arab, PharmD '[]'$  Theodis Shove, PharmD '[]'$  Peggyann Juba, PharmD '[x]'$  Konrad Dolores, PharmD   Positive urine culture Treated with Cephalexin, organism sensitive to the same and no further patient follow-up is required at this time.  Rosie Fate 10/24/2022, 9:48 AM

## 2022-11-02 DIAGNOSIS — R300 Dysuria: Secondary | ICD-10-CM | POA: Diagnosis not present

## 2022-11-02 DIAGNOSIS — R3915 Urgency of urination: Secondary | ICD-10-CM | POA: Diagnosis not present

## 2022-11-02 DIAGNOSIS — N3020 Other chronic cystitis without hematuria: Secondary | ICD-10-CM | POA: Diagnosis not present

## 2022-11-17 DIAGNOSIS — M47816 Spondylosis without myelopathy or radiculopathy, lumbar region: Secondary | ICD-10-CM | POA: Diagnosis not present

## 2022-12-03 DIAGNOSIS — N3020 Other chronic cystitis without hematuria: Secondary | ICD-10-CM | POA: Diagnosis not present

## 2023-01-20 DIAGNOSIS — I8311 Varicose veins of right lower extremity with inflammation: Secondary | ICD-10-CM | POA: Diagnosis not present

## 2023-01-20 DIAGNOSIS — L821 Other seborrheic keratosis: Secondary | ICD-10-CM | POA: Diagnosis not present

## 2023-01-20 DIAGNOSIS — I872 Venous insufficiency (chronic) (peripheral): Secondary | ICD-10-CM | POA: Diagnosis not present

## 2023-01-20 DIAGNOSIS — L570 Actinic keratosis: Secondary | ICD-10-CM | POA: Diagnosis not present

## 2023-01-20 DIAGNOSIS — I8312 Varicose veins of left lower extremity with inflammation: Secondary | ICD-10-CM | POA: Diagnosis not present

## 2023-01-25 DIAGNOSIS — Z7901 Long term (current) use of anticoagulants: Secondary | ICD-10-CM | POA: Diagnosis not present

## 2023-01-26 DIAGNOSIS — M47816 Spondylosis without myelopathy or radiculopathy, lumbar region: Secondary | ICD-10-CM | POA: Diagnosis not present

## 2023-02-17 DIAGNOSIS — M25512 Pain in left shoulder: Secondary | ICD-10-CM | POA: Diagnosis not present

## 2023-02-22 DIAGNOSIS — R3915 Urgency of urination: Secondary | ICD-10-CM | POA: Diagnosis not present

## 2023-02-22 DIAGNOSIS — N3020 Other chronic cystitis without hematuria: Secondary | ICD-10-CM | POA: Diagnosis not present

## 2023-02-22 DIAGNOSIS — R350 Frequency of micturition: Secondary | ICD-10-CM | POA: Diagnosis not present

## 2023-03-16 DIAGNOSIS — R3915 Urgency of urination: Secondary | ICD-10-CM | POA: Diagnosis not present

## 2023-03-16 DIAGNOSIS — R3912 Poor urinary stream: Secondary | ICD-10-CM | POA: Diagnosis not present

## 2023-03-31 DIAGNOSIS — I10 Essential (primary) hypertension: Secondary | ICD-10-CM | POA: Diagnosis not present

## 2023-03-31 DIAGNOSIS — Z7901 Long term (current) use of anticoagulants: Secondary | ICD-10-CM | POA: Diagnosis not present

## 2023-03-31 DIAGNOSIS — Z0000 Encounter for general adult medical examination without abnormal findings: Secondary | ICD-10-CM | POA: Diagnosis not present

## 2023-03-31 DIAGNOSIS — D696 Thrombocytopenia, unspecified: Secondary | ICD-10-CM | POA: Diagnosis not present

## 2023-03-31 DIAGNOSIS — N1831 Chronic kidney disease, stage 3a: Secondary | ICD-10-CM | POA: Diagnosis not present

## 2023-03-31 DIAGNOSIS — E1149 Type 2 diabetes mellitus with other diabetic neurological complication: Secondary | ICD-10-CM | POA: Diagnosis not present

## 2023-03-31 DIAGNOSIS — E785 Hyperlipidemia, unspecified: Secondary | ICD-10-CM | POA: Diagnosis not present

## 2023-03-31 DIAGNOSIS — E1122 Type 2 diabetes mellitus with diabetic chronic kidney disease: Secondary | ICD-10-CM | POA: Diagnosis not present

## 2023-04-30 DIAGNOSIS — R0609 Other forms of dyspnea: Secondary | ICD-10-CM | POA: Diagnosis not present

## 2023-04-30 DIAGNOSIS — I959 Hypotension, unspecified: Secondary | ICD-10-CM | POA: Diagnosis not present

## 2023-05-04 ENCOUNTER — Ambulatory Visit: Payer: Medicare Other | Admitting: Cardiology

## 2023-05-04 ENCOUNTER — Encounter: Payer: Self-pay | Admitting: Cardiology

## 2023-05-04 VITALS — BP 87/61 | HR 68 | Ht 71.0 in | Wt 199.0 lb

## 2023-05-04 DIAGNOSIS — I951 Orthostatic hypotension: Secondary | ICD-10-CM

## 2023-05-04 DIAGNOSIS — I4510 Unspecified right bundle-branch block: Secondary | ICD-10-CM | POA: Diagnosis not present

## 2023-05-04 DIAGNOSIS — R0609 Other forms of dyspnea: Secondary | ICD-10-CM

## 2023-05-04 DIAGNOSIS — E119 Type 2 diabetes mellitus without complications: Secondary | ICD-10-CM | POA: Diagnosis not present

## 2023-05-04 DIAGNOSIS — D6851 Activated protein C resistance: Secondary | ICD-10-CM | POA: Diagnosis not present

## 2023-05-04 DIAGNOSIS — I2510 Atherosclerotic heart disease of native coronary artery without angina pectoris: Secondary | ICD-10-CM

## 2023-05-04 DIAGNOSIS — I2584 Coronary atherosclerosis due to calcified coronary lesion: Secondary | ICD-10-CM | POA: Diagnosis not present

## 2023-05-04 DIAGNOSIS — Z86718 Personal history of other venous thrombosis and embolism: Secondary | ICD-10-CM

## 2023-05-04 DIAGNOSIS — Z86711 Personal history of pulmonary embolism: Secondary | ICD-10-CM | POA: Diagnosis not present

## 2023-05-04 DIAGNOSIS — R55 Syncope and collapse: Secondary | ICD-10-CM

## 2023-05-04 NOTE — Progress Notes (Signed)
ID:  SOPHEAP GOETTER, DOB Nov 05, 1946, MRN [id number]  PCP:  Daisy Floro, MD  Cardiologist:  Tessa Lerner, DO, [facility] (established care 05/04/23) Former Cardiology Providers : Dr. Donato Schultz Cardiology Providers at [facility] Advocate: Dr. Pete Pelt  REASON FOR CONSULT: Dyspnea/ near syncope  REQUESTING PHYSICIAN:  Daisy Floro, MD [address] [place],  [place] [address]  Chief Complaint  Patient presents with   DOE (dyspnea on exertion)   New Patient (Initial Visit)    Near syncope    HPI  [name] Hall is a 77 y.o. Caucasian male who presents to the clinic for evaluation of dyspnea on exertion, near syncope at the request of Daisy Floro, MD. His past medical history and cardiovascular risk factors include: History of orthostatic hypotension, hyperlipidemia, NIDDM Type II, history of DVT/PE, former smoker, factor V Leiden mutation, history of alcoholism/depression, history of TIA/stroke (right frontal lobe infarct provided by PCP records), AAA, status post EVAR with left iliac branch device (Dr. Lenell Antu 08/2022), history of intracranial hemorrhage (08/2014 records provided by PCP)  Patient is referred to the practice for evaluation of dyspnea on exertion and near syncope.  Dyspnea on exertion chronic however over the last 2 weeks more noticeable with minimal effort.  He had a CT PE study at [facility] recently which was negative for PE.  He was on Coumadin for thromboembolic prophylaxis given his history of DVT and PE and factor V Leiden deficiency.  He was recently transitioned to apixaban to 4 weeks ago (per patient).  Approximately 2 weeks ago patient felt lightheaded and dizzy and went to the [place] in [place].  EKG noted right bundle branch block, he was orthostatic positive, EMS transferred him to [facility].  Records provided by the patient and reviewed in Care Everywhere as part of today's encounter.  High sensitive troponins were  negative x 2, D-dimers were elevated, CT PE protocol study was negative for PE at that time.  Patient states that his last stress test was approximately 2 years ago with Dr. Jodie Echevaria at the [facility].  No prior history of coronary interventions.  CT PE protocol study does note coronary artery calcification.  He does have a history of abdominal aortic aneurysm status post EVAR with Dr. Juanetta Gosling in October 2023.  He does have a history of orthostatic hypotension.  He could do better with hydration.  He wears compression stockings up to the knees bilaterally.  Not much exercise on a regular basis.  And was recently started on tamsulosin by Dr. Alvester Morin from urology for BPH as well.   FUNCTIONAL STATUS: 0.5 miles walking three times a week.    CARDIAC DATABASE: EKG: May 04, 2023: Sinus rhythm, 79 bpm, first-degree AV block, right bundle branch block, ST-T changes likely secondary to RBBB but ischemia cannot be ruled out.  Echocardiogram: 04/14/2012 - Left ventricle: The cavity size was normal. Wall thickness    was normal. Systolic function was normal. The estimated    ejection fraction was in the range of 60% to 65%. Wall    motion was normal; there were no regional wall motion    abnormalities. Left ventricular diastolic function    parameters were normal.  - Aortic valve: There was no stenosis.  - Mitral valve: Trivial regurgitation.  - Left atrium: The atrium was mildly dilated.  - Right ventricle: The cavity size was normal. Systolic    function was normal.  - Pulmonary arteries: PA peak  pressure: 29mm Hg (S).  - Inferior vena cava: The vessel was normal in size; the    respirophasic diameter changes were in the normal range (=    50%); findings are consistent with normal central venous    pressure.   Stress Test: April 2023 CE:  1. There is a small, mild-intensity apical defect worse at stress than rest  that could represent reversible ischemia; however, ARTIFACT CANNOT BE EXCLUDED   esp since there was normal LV function with ejection fraction 78% and no  segmental wall motion abnormality.  2. Overall left ventricle systolic function is normal. The left ventricular  end-diastolic volume is 99 ml. The overall calculated left ventricular ejection  fraction is 78%. No left ventricular regional wall motion abnormalities.  3. Low risk cardiovascular stress study.   Carotid duplex: Apr 29, 2016 Less than 50% stenosis in the right and left internal carotid arteries.    RADIOLOGY: CT abdomen pelvis with and without contrast: September 28, 2022 IMPRESSION: VASCULAR 1. Status post aortobi-iliac and branched left iliac endograft repair of infrarenal abdominal aortic and left iliac artery aneurysms. No complicating features, specifically no evidence of endoleak or aneurysm sac enlargement. 2.  Aortic Atherosclerosis (ICD10-I70.0).   NON-VASCULAR   1. Diverticulosis. 2. Multilevel degenerative changes of the lumbar spine.   Marliss Coots, MD   ALLERGIES: Allergies  Allergen Reactions   Degarelix     Other Reaction(s): bowel obstruction   Duloxetine Swelling    Other Reaction(s): Hypotension   Pregabalin     Other reaction(s): Dizziness (intolerance), Low blood pressure, Other (See Comments), UNSTEADY GAIT  Other Reaction(s): Dizziness, Other (See Comments)   Ramipril Hives    Other Reaction(s): hives   Sulfonamide Derivatives     Other Reaction(s): hives   Oxycodone Itching and Rash    MEDICATION LIST PRIOR TO VISIT: Current Meds  Medication Sig   acetaminophen (TYLENOL) 650 MG CR tablet Take 650 mg by mouth 2 (two) times daily.   apixaban (ELIQUIS) 2.5 MG TABS tablet Take 2.5 mg by mouth 2 (two) times daily.   atorvastatin (LIPITOR) 80 MG tablet Take 80 mg by mouth at bedtime.   lithium carbonate 150 MG capsule Take 450 mg by mouth 2 (two) times daily with a meal.   metFORMIN (GLUCOPHAGE) 500 MG tablet Take 500 mg by mouth daily.    ONE TOUCH ULTRA  TEST test strip    tamsulosin (FLOMAX) 0.4 MG CAPS capsule Take 0.4 mg by mouth daily after supper.   Vibegron (GEMTESA) 75 MG TABS Take 75 mg by mouth daily at 2 PM.     PAST MEDICAL HISTORY: Past Medical History:  Diagnosis Date   Bipolar disorder (HCC)    Diabetes mellitus    type 2   DVT (deep venous thrombosis) (HCC) 2006   leg after joint replacement    Dyspnea    with exertion occ   ED (erectile dysfunction)    Factor 5 Leiden mutation, heterozygous (HCC)    Hepatitis    41 yrs ago hepatitis a   Hyperlipidemia    Orthostatic hypotension    Osteoarthritis    PE (pulmonary embolism) 01/27/2017   after right tha revision   PERSONAL HISTORY, VENOUS THROMBOSIS AND EMBOLISM    Prostate CA (HCC) 2012   treated with radiation   PROSTATE CANCER    Stroke (HCC) 15-20 yrs ago, saw on mri   TIA no residual from    PAST SURGICAL HISTORY: Past Surgical History:  Procedure  Laterality Date   ABDOMINAL AORTIC ENDOVASCULAR STENT GRAFT Bilateral 08/31/2022   Procedure: ABDOMINAL AORTIC ENDOVASCULAR STENT GRAFT;  Surgeon: Leonie Douglas, MD;  Location: [facility] OR;  Service: Vascular;  Laterality: Bilateral;   CATARACT EXTRACTION Bilateral    colonoscopy     polyp located and tested negative   COLONOSCOPY WITH PROPOFOL  10/2003, [id number]   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   Procedure: PENILE PROTHESIS INFLATABLE;  Surgeon: Marcine Matar, MD;  Location[facility];  Service: Urology;  Laterality: N/A;   TONSILLECTOMY AND ADENOIDECTOMY     AS A CHILD   TOTAL HIP ARTHROPLASTY     Left and right hip replacements   TOTAL HIP REVISION Right 01/27/2017   Procedure: RIGHT TOTAL HIP REVISION;  Surgeon: Ollen Gross, MD;  Location: [facility] ORS;  Service: Orthopedics;  Laterality: Right;    FAMILY HISTORY: The patient family history includes Depression in his sister; Heart attack in his father; Hyperlipidemia in his father and mother; Hypertension in his father and  mother; Sudden death in his father.  SOCIAL HISTORY:  The patient  reports that he quit smoking about 9 years ago. His smoking use included cigarettes. He has a 30.00 pack-year smoking history. He has never used smokeless tobacco. He reports that he does not drink alcohol and does not use drugs.  REVIEW OF SYSTEMS: Review of Systems  Cardiovascular:  Positive for dyspnea on exertion and near-syncope. Negative for chest pain, claudication, irregular heartbeat, leg swelling, orthopnea, palpitations, paroxysmal nocturnal dyspnea and syncope.  Respiratory:  Positive for shortness of breath.   Hematologic/Lymphatic: Negative for bleeding problem.  Musculoskeletal:  Negative for muscle cramps and myalgias.  Neurological:  Positive for dizziness and light-headedness.    PHYSICAL EXAM:    05/04/2023    9:56 AM 10/21/2022    4:45 PM 10/21/2022   12:31 PM  Vitals with BMI  Height 5\' 11"   5\' 11"   Weight 199 lbs  202 lbs 13 oz  BMI 27.77  28.3  Systolic 87 124   Diastolic 61 61   Pulse 68 72    Orthostatic VS for the past 72 hrs (Last 3 readings):  Orthostatic BP Patient Position BP Location Cuff Size Orthostatic Pulse  05/04/23 1005 (!) 65/35 Standing Left Arm Large 89  05/04/23 1003 (!) 82/55 Sitting Left Arm Large 82  05/04/23 1002 127/76 Supine Left Arm Large 80   Physical Exam  Constitutional: No distress.  Age appropriate, hemodynamically stable.   Neck: No JVD present.  Cardiovascular: Normal rate, regular rhythm, S1 normal, S2 normal, intact distal pulses and normal pulses. Exam reveals no gallop, no S3 and no S4.  No murmur heard. Pulmonary/Chest: Effort normal and breath sounds normal. No stridor. He has no wheezes. He has no rales.  Abdominal: Soft. Bowel sounds are normal. He exhibits no distension. There is no abdominal tenderness.  Musculoskeletal:        General: No edema.     Cervical back: Neck supple.     Comments: Bilateral compression stockings  Neurological: He is  alert and oriented to person, place, and time. He has intact cranial nerves (2-12).  Skin: Skin is warm and moist.    LABORATORY DATA:    Latest Ref Rng & Units 10/21/2022   12:52 PM 09/01/2022    5:00 AM 08/31/2022   11:00 AM  CBC  WBC 4.0 - 10.5 K/uL 8.1  10.8  5.7   Hemoglobin 13.0 - 17.0 g/dL 54.0  11.4  11.7   Hematocrit 39.0 - 52.0 % 40.4  34.2  37.0   Platelets 150 - 400 K/uL 172  107  90        Latest Ref Rng & Units 10/21/2022   12:52 PM 09/01/2022    5:00 AM 08/31/2022   11:00 AM  CMP  Glucose 70 - 99 mg/dL 89  409  811   BUN 8 - 23 mg/dL 20  18  18    Creatinine 0.61 - 1.24 mg/dL 9.14  7.82  9.56   Sodium 135 - 145 mmol/L 137  133  136   Potassium 3.5 - 5.1 mmol/L 4.2  4.5  4.3   Chloride 98 - 111 mmol/L 107  108  110   CO2 22 - 32 mmol/L 26  21  22    Calcium 8.9 - 10.3 mg/dL 9.7  8.9  8.5   Total Protein 6.5 - 8.1 g/dL 7.5     Total Bilirubin 0.3 - 1.2 mg/dL 0.7     Alkaline Phos 38 - 126 U/L 111     AST 15 - 41 U/L 18     ALT 0 - 44 U/L 14       Lab Results  Component Value Date   CHOL  12/23/2007    166        ATP III CLASSIFICATION:  <200     mg/dL   Desirable  213-086  mg/dL   Borderline High  >=578    mg/dL   High   HDL 61 46/96/2952   LDLCALC  12/23/2007    91        Total Cholesterol/HDL:CHD Risk Coronary Heart Disease Risk Table                     Men   Women  1/2 Average Risk   3.4   3.3   TRIG 70 12/23/2007   CHOLHDL 2.7 12/23/2007   No components found for: "NTPROBNP" No results for input(s): "PROBNP" in the last 8760 hours. No results for input(s): "TSH" in the last 8760 hours.  BMP Recent Labs    08/31/22 1100 09/01/22 0500 10/21/22 1252  NA 136 133* 137  K 4.3 4.5 4.2  CL 110 108 107  CO2 22 21* 26  GLUCOSE 103* 145* 89  BUN 18 18 20   CREATININE 1.24 1.05 1.33*  CALCIUM 8.5* 8.9 9.7  GFRNONAA >60 >60 55*    HEMOGLOBIN A1C Lab Results  Component Value Date   HGBA1C 5.6 01/20/2017   MPG 114 01/20/2017   External  Labs: Collected: 03/31/2023 A1c 6.2. Hemoglobin 13.8, hematocrit 42.4%. BUN 32, creatinine 1.36. eGFR 54. Sodium 137, potassium 4.3, chloride 107, bicarb 26. AST 11, ALT 13, alkaline phosphatase 90  External Labs: Collected: Apr 27, 2023 at [facility], Care Everywhere. Sodium 138, potassium 4.4, chloride 108, bicarb 23,  EGFR 64. BUN 20, creatinine 1.18 AST 12, ALT 14, alkaline phosphatase 98 Hemoglobin 13.4, hematocrit 39.2% D-dimer 2210 High sensitive troponins negative x 2. BNP 89.  IMPRESSION:    ICD-10-CM   1. DOE (dyspnea on exertion)  R06.09 EKG 12-Lead    PCV ECHOCARDIOGRAM COMPLETE    PCV MYOCARDIAL PERFUSION WITH LEXISCAN    2. Coronary atherosclerosis due to calcified coronary lesion  I25.10 PCV ECHOCARDIOGRAM COMPLETE   I25.84 PCV MYOCARDIAL PERFUSION WITH LEXISCAN    3. RBBB  I45.10 PCV ECHOCARDIOGRAM COMPLETE    PCV MYOCARDIAL PERFUSION WITH LEXISCAN    4. Near syncope  R55 PCV ECHOCARDIOGRAM COMPLETE    PCV MYOCARDIAL PERFUSION WITH LEXISCAN    LONG TERM MONITOR (3-14 DAYS)    5. Orthostatic hypotension  I95.1     6. Hx of deep venous thrombosis  Z86.718     7. Hx of pulmonary embolus  Z86.711     8. Factor V Leiden mutation (HCC)  D68.51     9. Non-insulin dependent type 2 diabetes mellitus (HCC)  E11.9        RECOMMENDATIONS: [name] Hall is a 77 y.o. Caucasian male whose past medical history and cardiac risk factors include: History of orthostatic hypotension, hyperlipidemia, NIDDM Type II, history of DVT/PE, former smoker, factor V Leiden mutation, history of alcoholism/depression, history of TIA/stroke (right frontal lobe infarct provided by PCP records), AAA, status post EVAR with left iliac branch device (Dr. Lenell Antu 08/2022), history of intracranial hemorrhage (08/2014 records provided by PCP).  DOE (dyspnea on exertion) Coronary atherosclerosis due to calcified coronary lesion RBBB Denies anginal chest pain. But has dyspnea on  exertion which is chronic but over the last couple weeks more progressive. Given the change in clinical history recommend an echocardiogram and stress test. Given his orthostatic hypotension he is at risk of falls and therefore recommend pharmacological stress. Outside labs, CT scan PE protocol reviewed as part of medical decision making at today's visit..  Near syncope Orthostatic hypotension Orthostatic vital signs positive for orthostasis. Patient carries a history of orthostatic hypotension. Recently started on tamsulosin 2 to 3 weeks ago when the symptoms became more prominent.  I have asked him to hold tamsulosin for now and reach out to Dr. Alvester Morin and review the symptoms to see if there is an alternative. Outside labs independently reviewed. Former Contractor drink in 2015 per patient. Encouraged him to increase physical activity as tolerated. Encouraged him to increase hydration. Given his history of DVT/PE/factor V Leiden deficiency he had a CT PE protocol which was negative for pulmonary embolism. Zio patch to evaluate for dysrhythmias which may be contributory.   Hx of deep venous thrombosis Hx of pulmonary embolus Factor V Leiden mutation (HCC) Was on Coumadin for long-term recently transition to Eliquis. I have asked him to establish care with hematology to discuss anticoagulation management.  Will defer to PCP.  Non-insulin dependent type 2 diabetes mellitus (HCC) Hemoglobin A1c well-controlled. Currently on statin therapy. Will hold off ACE inhibitor/ARB given his hypotension. Patient will provide a copy of his lipids at the next office visit.  FINAL MEDICATION LIST END OF ENCOUNTER: No orders of the defined types were placed in this encounter.   Medications Discontinued During This Encounter  Medication Reason   warfarin (COUMADIN) 5 MG tablet    traZODone (DESYREL) 100 MG tablet    desmopressin (DDAVP) 0.1 MG tablet    cephALEXin (KEFLEX) 500 MG capsule       Current Outpatient Medications:    acetaminophen (TYLENOL) 650 MG CR tablet, Take 650 mg by mouth 2 (two) times daily., Disp: , Rfl:    apixaban (ELIQUIS) 2.5 MG TABS tablet, Take 2.5 mg by mouth 2 (two) times daily., Disp: , Rfl:    atorvastatin (LIPITOR) 80 MG tablet, Take 80 mg by mouth at bedtime., Disp: , Rfl:    lithium carbonate 150 MG capsule, Take 450 mg by mouth 2 (two) times daily with a meal., Disp: , Rfl:    metFORMIN (GLUCOPHAGE) 500 MG tablet, Take 500 mg by mouth daily. , Disp: , Rfl:    ONE TOUCH ULTRA  TEST test strip, , Disp: , Rfl:    tamsulosin (FLOMAX) 0.4 MG CAPS capsule, Take 0.4 mg by mouth daily after supper., Disp: , Rfl:    Vibegron (GEMTESA) 75 MG TABS, Take 75 mg by mouth daily at 2 PM., Disp: , Rfl:   Orders Placed This Encounter  Procedures   PCV MYOCARDIAL PERFUSION WITH LEXISCAN   LONG TERM MONITOR (3-14 DAYS)   EKG 12-Lead   PCV ECHOCARDIOGRAM COMPLETE    There are no Patient Instructions on file for this visit.   --Continue cardiac medications as reconciled in final medication list. --Return in about 6 weeks (around 06/15/2023) for Follow up, Dyspnea, orthostatic hypotension, I will discuss test results. or sooner if needed. --Continue follow-up with your primary care physician regarding the management of your other chronic comorbid conditions.  Patient's questions and concerns were addressed to his satisfaction. He voices understanding of the instructions provided during this encounter.   This note was created using a voice recognition software as a result there may be grammatical errors inadvertently enclosed that do not reflect the nature of this encounter. Every attempt is made to correct such errors.  Total time spent: 61 minutes.  Tessa Lerner, [place], [facility]  Pager:  [phone] Office: [phone]

## 2023-05-06 ENCOUNTER — Other Ambulatory Visit: Payer: Self-pay

## 2023-05-06 ENCOUNTER — Telehealth: Payer: Self-pay | Admitting: Pulmonary Disease

## 2023-05-06 DIAGNOSIS — J432 Centrilobular emphysema: Secondary | ICD-10-CM

## 2023-05-06 MED ORDER — FLUTICASONE FUROATE-VILANTEROL 100-25 MCG/ACT IN AEPB: 1.0000 | INHALATION_SPRAY | Freq: Every day | RESPIRATORY_TRACT | 6 refills | Status: DC

## 2023-05-06 NOTE — Telephone Encounter (Signed)
Dr. Francine Graven, I have patient scheduled for acute visit next Thursday he complains of SOB and dry cough-starting about 10days ago, no other symptoms noted.   Also have PFT scheduled on 8/7-this was first available

## 2023-05-06 NOTE — Telephone Encounter (Signed)
Patient is requesting a refill on breo. He was last seen over a year ago and was supposed to f/u for PFTs and 6 month OV.  I can call and get both of these set up. Are you okay with me doing a one time fill until apt

## 2023-05-06 NOTE — Telephone Encounter (Signed)
Yes, sounds good.  Thank you, JD

## 2023-05-06 NOTE — Telephone Encounter (Signed)
Dr. Francine Graven I have patient scheduled for acute visit next Thursday he complains of sob and dry cough-starting about 10 days ago.   Also soonest pft is 8/7 I went ahead and scheduled that as well

## 2023-05-06 NOTE — Telephone Encounter (Signed)
Pt needs a refill for [facility]

## 2023-05-06 NOTE — Telephone Encounter (Signed)
thanks

## 2023-05-13 ENCOUNTER — Ambulatory Visit: Payer: No Typology Code available for payment source | Admitting: Pulmonary Disease

## 2023-05-13 NOTE — Progress Notes (Deleted)
Synopsis: Referred in December 2022 for COPD by Daisy Floro, MD  Subjective:   PATIENT ID: [name] Hall: male DOB: Jan 21, 1946, MRN: [id number]  HPI  No chief complaint on file.  [name] Hall is a 77 year old male, former smoker with hypertension, DMII, DVT/PE, and Factor V Leiden deficiency who returns to pulmonary clinic for COPD follow up.   He was seen at the [facility] 04/27/23 for shortness of breath. CTA PE study was negative for PE. Troponin was negative.   OV 12/30/21 He was started on breo ellipta 100-40mcg 1 puff daily for concern of obstructive lung disease which he took for 3 weeks and the cough subsided. He is not taking the breo now. He is taking zyrtec plus fluticasone for post-nasal drainage. He is currently having very little sinus congestion and drainage now. He is mainly coughing in the morning time, clear phlegm. Experiencing dyspnea on exertion.   He was given an inhaler by his PCP but says this was too harsh.   OV 10/30/21 He reports having cough for 6-8 weeks. He recently tested positive for covid 11/15 and again 11/30. He was started on augmentin by his PCP 11/30 and benzonatate. He started to use Zyrtec daily over the last month. He does not know if he has seasonal allergies.   The cough is intermittently productive. He has some sinus congestion and intermittent drainage. He does have some dyspnea with the cough. He denies heartburn.   He is a former smoker, undergoing lung cancer screening at the [place]. Has scan on 12/9 coming up. He was in the roofing business. Has agent orange exposure.   Past Medical History:  Diagnosis Date   Bipolar disorder (HCC)    Diabetes mellitus    type 2   DVT (deep venous thrombosis) (HCC) 2006   leg after joint replacement    Dyspnea    with exertion occ   ED (erectile dysfunction)    Factor 5 Leiden mutation, heterozygous (HCC)    Hepatitis    41 yrs ago hepatitis a   Hyperlipidemia    Orthostatic  hypotension    Osteoarthritis    PE (pulmonary embolism) 01/27/2017   after right tha revision   PERSONAL HISTORY, VENOUS THROMBOSIS AND EMBOLISM    Prostate CA (HCC) 2012   treated with radiation   PROSTATE CANCER    Stroke (HCC) 15-20 yrs ago, saw on mri   TIA no residual from     Family History  Problem Relation Age of Onset   Hyperlipidemia Mother    Hypertension Mother    Heart attack Father    Hyperlipidemia Father    Hypertension Father    Sudden death Father    Depression Sister      Social History   Socioeconomic History   Marital status: Married    Spouse name: Not on file   Number of children: Not on file   Years of education: Not on file   Highest education level: Not on file  Occupational History   Not on file  Tobacco Use   Smoking status: Former    Packs/day: 1.00    Years: 30.00    Additional pack years: 0.00    Total pack years: 30.00    Types: Cigarettes    Quit date: 02/22/2014    Years since quitting: 9.2   Smokeless tobacco: Never  Vaping Use   Vaping Use: Never used  Substance and Sexual Activity   Alcohol  use: No   Drug use: No   Sexual activity: Never  Other Topics Concern   Not on file  Social History Narrative   Not on file   Social Determinants of Health   Financial Resource Strain: Not on file  Food Insecurity: Not on file  Transportation Needs: Not on file  Physical Activity: Not on file  Stress: Not on file  Social Connections: Not on file  Intimate Partner Violence: Not on file     Allergies  Allergen Reactions   Degarelix     Other Reaction(s): bowel obstruction   Duloxetine Swelling    Other Reaction(s): Hypotension   Pregabalin     Other reaction(s): Dizziness (intolerance), Low blood pressure, Other (See Comments), UNSTEADY GAIT  Other Reaction(s): Dizziness, Other (See Comments)   Ramipril Hives    Other Reaction(s): hives   Sulfonamide Derivatives     Other Reaction(s): hives   Oxycodone Itching and  Rash     Outpatient Medications Prior to Visit  Medication Sig Dispense Refill   acetaminophen (TYLENOL) 650 MG CR tablet Take 650 mg by mouth 2 (two) times daily.     apixaban (ELIQUIS) 2.5 MG TABS tablet Take 2.5 mg by mouth 2 (two) times daily.     atorvastatin (LIPITOR) 80 MG tablet Take 80 mg by mouth at bedtime.     fluticasone furoate-vilanterol (BREO ELLIPTA) 100-25 MCG/ACT AEPB Inhale 1 puff into the lungs daily. 28 each 6   lithium carbonate 150 MG capsule Take 450 mg by mouth 2 (two) times daily with a meal.     metFORMIN (GLUCOPHAGE) 500 MG tablet Take 500 mg by mouth daily.      ONE TOUCH ULTRA TEST test strip      tamsulosin (FLOMAX) 0.4 MG CAPS capsule Take 0.4 mg by mouth daily after supper.     Vibegron (GEMTESA) 75 MG TABS Take 75 mg by mouth daily at 2 PM.     No facility-administered medications prior to visit.   Review of Systems  Constitutional:  Negative for chills, fever, malaise/fatigue and weight loss.  HENT:  Negative for congestion, sinus pain and sore throat.   Eyes: Negative.   Respiratory:  Positive for cough, sputum production and shortness of breath. Negative for hemoptysis and wheezing.   Cardiovascular:  Negative for chest pain, palpitations, orthopnea, claudication and leg swelling.  Gastrointestinal:  Negative for abdominal pain, heartburn, nausea and vomiting.  Genitourinary: Negative.   Musculoskeletal:  Negative for joint pain and myalgias.  Skin:  Negative for rash.  Neurological:  Negative for weakness.  Endo/Heme/Allergies: Negative.   Psychiatric/Behavioral: Negative.      Objective:   There were no vitals filed for this visit.  Physical Exam Constitutional:      General: He is not in acute distress. HENT:     Head: Normocephalic and atraumatic.  Eyes:     Extraocular Movements: Extraocular movements intact.     Conjunctiva/sclera: Conjunctivae normal.     Pupils: Pupils are equal, round, and reactive to light.  Cardiovascular:      Rate and Rhythm: Normal rate and regular rhythm.     Pulses: Normal pulses.     Heart sounds: Normal heart sounds. No murmur heard. Pulmonary:     Breath sounds: Rales (mild, left base) present.  Abdominal:     General: Bowel sounds are normal.     Palpations: Abdomen is soft.  Musculoskeletal:     Right lower leg: No edema.  Left lower leg: No edema.  Lymphadenopathy:     Cervical: No cervical adenopathy.  Skin:    General: Skin is warm and dry.  Neurological:     General: No focal deficit present.     Mental Status: He is alert.  Psychiatric:        Mood and Affect: Mood normal.        Behavior: Behavior normal.        Thought Content: Thought content normal.        Judgment: Judgment normal.    CBC    Component Value Date/Time   WBC 8.1 10/21/2022 1252   RBC 4.13 (L) 10/21/2022 1252   HGB 12.4 (L) 10/21/2022 1252   HCT 40.4 10/21/2022 1252   PLT 172 10/21/2022 1252   MCV 97.8 10/21/2022 1252   MCH 30.0 10/21/2022 1252   MCHC 30.7 10/21/2022 1252   RDW 14.1 10/21/2022 1252   LYMPHSABS 1.4 10/21/2022 1252   MONOABS 0.8 10/21/2022 1252   EOSABS 0.3 10/21/2022 1252   BASOSABS 0.1 10/21/2022 1252      Latest Ref Rng & Units 10/21/2022   12:52 PM 09/01/2022    5:00 AM 08/31/2022   11:00 AM  BMP  Glucose 70 - 99 mg/dL 89  161  096   BUN 8 - 23 mg/dL 20  18  18    Creatinine 0.61 - 1.24 mg/dL 0.45  4.09  8.11   Sodium 135 - 145 mmol/L 137  133  136   Potassium 3.5 - 5.1 mmol/L 4.2  4.5  4.3   Chloride 98 - 111 mmol/L 107  108  110   CO2 22 - 32 mmol/L 26  21  22    Calcium 8.9 - 10.3 mg/dL 9.7  8.9  8.5    Chest imaging: CXR 10/14/21 The heart size and mediastinal contours are within normal limits. Both lungs are clear. The visualized skeletal structures are unremarkable.  PFT:     No data to display          Labs: 10/29/21 Cr 1.4 Hgb 12 Sars-CoV2 positive  Path:  Echo:  Heart Catheterization:  Assessment & Plan:   No diagnosis  found.  Discussion: [name] Hall is a 77 year old male, former smoker with hypertension, DMII, DVT/PE, and Factor V Leiden deficiency who returns to pulmonary clinic for COPD follow up.   He has centrilobular emphysema as he reports from CT Chest scan on 12/23/21 at the [place]. We will request a copy of the report.   He is to resume breo ellipta 1 puff daily. He is to continue zyrtec for possible allergies. He is to continue fluticasone nasal spray for post nasal drainage.   Follow up in 6 months with pulmonary function tests.  Melody Comas, MD [facility] Office: [phone]    Current Outpatient Medications:    acetaminophen (TYLENOL) 650 MG CR tablet, Take 650 mg by mouth 2 (two) times daily., Disp: , Rfl:    apixaban (ELIQUIS) 2.5 MG TABS tablet, Take 2.5 mg by mouth 2 (two) times daily., Disp: , Rfl:    atorvastatin (LIPITOR) 80 MG tablet, Take 80 mg by mouth at bedtime., Disp: , Rfl:    fluticasone furoate-vilanterol (BREO ELLIPTA) 100-25 MCG/ACT AEPB, Inhale 1 puff into the lungs daily., Disp: 28 each, Rfl: 6   lithium carbonate 150 MG capsule, Take 450 mg by mouth 2 (two) times daily with a meal., Disp: , Rfl:    metFORMIN (GLUCOPHAGE) 500  MG tablet, Take 500 mg by mouth daily. , Disp: , Rfl:    ONE TOUCH ULTRA TEST test strip, , Disp: , Rfl:    tamsulosin (FLOMAX) 0.4 MG CAPS capsule, Take 0.4 mg by mouth daily after supper., Disp: , Rfl:    Vibegron (GEMTESA) 75 MG TABS, Take 75 mg by mouth daily at 2 PM., Disp: , Rfl:

## 2023-05-14 ENCOUNTER — Ambulatory Visit: Payer: Medicare Other | Admitting: Pulmonary Disease

## 2023-05-14 ENCOUNTER — Encounter: Payer: Self-pay | Admitting: Pulmonary Disease

## 2023-05-14 VITALS — BP 102/70 | HR 68 | Temp 98.6°F | Ht 71.0 in | Wt 200.0 lb

## 2023-05-14 DIAGNOSIS — J441 Chronic obstructive pulmonary disease with (acute) exacerbation: Secondary | ICD-10-CM | POA: Diagnosis not present

## 2023-05-14 MED ORDER — PREDNISONE 20 MG PO TABS: 20.0000 mg | ORAL_TABLET | Freq: Every day | ORAL | 0 refills | Status: AC

## 2023-05-14 MED ORDER — AZITHROMYCIN 250 MG PO TABS
ORAL_TABLET | ORAL | 0 refills | Status: DC
Start: 2023-05-14 — End: 2023-06-18

## 2023-05-14 NOTE — Patient Instructions (Signed)
Given the clear chest imaging as well as clear exam today I think most likely her experiencing increased inflammation is related to emphysema or something like asthma is possible as well.  Continue the Breo 1 puff once a day rinse mouth out after every use  Take azithromycin 2 tablet on day 1 and 1 tablets on day 2 through 5 as well as prednisone 20 mg tablet once a day for 5 days see if he can decrease inflammation and help your breathing faster  Return to clinic in 3 months or sooner as needed with Dr. Francine Graven

## 2023-05-19 NOTE — Progress Notes (Signed)
@Patient  ID: [name] Hall, male    DOB: 04/27/1946, 77 y.o.   MRN: [id number]  Chief Complaint  Patient presents with   Acute Visit    Cough,SOB     Referring provider: Daisy Floro, MD  HPI:   77 y.o. with likely asthma, history of emphysema, no PFTs to evaluate COPD as of yet but ordered, presents for acute visit with cough and shortness of breath.  Most recent pulmonary note x 2 reviewed.  Recent ED evaluation at [facility] reviewed.  Patient experienced worsening shortness of breath over the last couple weeks or more.  Presented to the ED 5/28 with shortness of breath and cough.  He has a history of PE on Xarelto.  Despite this they checked a D-dimer which was positive and sent for CTA PE protocol.  No PE not surprising on anticoagulation.  On my review and interpretation does show some emphysema, otherwise clear lungs.  EKG with new right bundle branch block it seems.  Troponin is normal.  Recommended cardiology follow-up, he was referred.  It seems no real new medications were started.  He did resume Breo in the interim.  Has helped a little bit.  But still with ongoing cough and shortness of breath worse than baseline.  Cough largely dry.  Questionaires / Pulmonary Flowsheets:   ACT:      No data to display          MMRC:     No data to display          Epworth:      No data to display          Tests:   FENO:  No results found for: "NITRICOXIDE"  PFT:     No data to display          WALK:      No data to display          Imaging: Personally reviewed No results found.  Lab Results: Personally reviewed CBC    Component Value Date/Time   WBC 8.1 10/21/2022 1252   RBC 4.13 (L) 10/21/2022 1252   HGB 12.4 (L) 10/21/2022 1252   HCT 40.4 10/21/2022 1252   PLT 172 10/21/2022 1252   MCV 97.8 10/21/2022 1252   MCH 30.0 10/21/2022 1252   MCHC 30.7 10/21/2022 1252   RDW 14.1 10/21/2022 1252   LYMPHSABS 1.4 10/21/2022 1252    MONOABS 0.8 10/21/2022 1252   EOSABS 0.3 10/21/2022 1252   BASOSABS 0.1 10/21/2022 1252    BMET    Component Value Date/Time   NA 137 10/21/2022 1252   K 4.2 10/21/2022 1252   CL 107 10/21/2022 1252   CO2 26 10/21/2022 1252   GLUCOSE 89 10/21/2022 1252   BUN 20 10/21/2022 1252   CREATININE 1.33 (H) 10/21/2022 1252   CALCIUM 9.7 10/21/2022 1252   GFRNONAA 55 (L) 10/21/2022 1252   GFRAA >60 01/29/2017 0513    BNP No results found for: "BNP"  ProBNP    Component Value Date/Time   PROBNP 340.4 (H) 08/31/2014 1256    Specialty Problems   None   Allergies  Allergen Reactions   Degarelix     Other Reaction(s): bowel obstruction   Duloxetine Swelling    Other Reaction(s): Hypotension   Pregabalin     Other reaction(s): Dizziness (intolerance), Low blood pressure, Other (See Comments), UNSTEADY GAIT  Other Reaction(s): Dizziness, Other (See Comments)   Ramipril Hives    Other Reaction(s): hives  Sulfonamide Derivatives     Other Reaction(s): hives   Oxycodone Itching and Rash    Immunization History  Administered Date(s) Administered   COVID-19, mRNA, vaccine(Comirnaty)77 years and older 09/05/2022   Fluad Quad(high Dose 77+) 08/22/2020, 09/03/2021   Influenza Whole 09/02/2010   Influenza, High Dose Seasonal PF 08/30/2014, 09/17/2022   Influenza,inj,Quad PF,77 Mos 11/15/2014   Influenza-Unspecified 10/26/2017, 08/16/2018   PFIZER Comirnaty(Gray Top)Covid-19 Tri-Sucrose Vaccine 04/25/2021   PFIZER(Purple Top)SARS-COV-2 Vaccination 12/31/2019, 01/21/2020   Pneumococcal Conjugate-13 03/28/2014, 08/31/2015   Pneumococcal Polysaccharide-23 10/24/2012, 09/01/2014, 08/16/2017   Rsv, Bivalent, Protein Subunit Rsvpref,pf Verdis Frederickson) 03/02/2023   Tdap 07/31/2012, 08/31/2014   Zoster Recombinat (Shingrix) 09/15/2017, 11/17/2017    Past Medical History:  Diagnosis Date   Bipolar disorder (HCC)    Diabetes mellitus    type 2   DVT (deep venous thrombosis) (HCC) 2006    leg after joint replacement    Dyspnea    with exertion occ   ED (erectile dysfunction)    Factor 5 Leiden mutation, heterozygous (HCC)    Hepatitis    41 yrs ago hepatitis a   Hyperlipidemia    Orthostatic hypotension    Osteoarthritis    PE (pulmonary embolism) 01/27/2017   after right tha revision   PERSONAL HISTORY, VENOUS THROMBOSIS AND EMBOLISM    Prostate CA (HCC) 2012   treated with radiation   PROSTATE CANCER    Stroke (HCC) 15-20 yrs ago, saw on mri   TIA no residual from    Tobacco History: Social History   Tobacco Use  Smoking Status Former   Packs/day: 1.00   Years: 30.00   Additional pack years: 0.00   Total pack years: 30.00   Types: Cigarettes   Quit date: 02/22/2014   Years since quitting: 9.2  Smokeless Tobacco Never   Counseling given: Not Answered   Continue to not smoke  Outpatient Encounter Medications as of 05/14/2023  Medication Sig   acetaminophen (TYLENOL) 650 MG CR tablet Take 650 mg by mouth 2 (two) times daily.   apixaban (ELIQUIS) 2.5 MG TABS tablet Take 2.5 mg by mouth 2 (two) times daily.   atorvastatin (LIPITOR) 80 MG tablet Take 80 mg by mouth at bedtime.   azithromycin (ZITHROMAX) 250 MG tablet Take 500 mg (2 tab) on day 1 then take 250 mg (1 tab) on days 2 through 5   fluticasone furoate-vilanterol (BREO ELLIPTA) 100-25 MCG/ACT AEPB Inhale 1 puff into the lungs daily.   lithium carbonate 150 MG capsule Take 450 mg by mouth 2 (two) times daily with a meal.   metFORMIN (GLUCOPHAGE) 500 MG tablet Take 500 mg by mouth daily.    ONE TOUCH ULTRA TEST test strip    predniSONE (DELTASONE) 20 MG tablet Take 1 tablet (20 mg total) by mouth daily with breakfast for 5 days.   tamsulosin (FLOMAX) 0.4 MG CAPS capsule Take 0.4 mg by mouth daily after supper.   Vibegron (GEMTESA) 75 MG TABS Take 75 mg by mouth daily at 2 PM.   No facility-administered encounter medications on file as of 05/14/2023.     Review of Systems  Review of Systems   No chest pain with exertion.  No orthopnea or PND.  Comprehensive review of systems otherwise negative. Physical Exam  BP 102/70 (BP Location: Left Arm, Patient Position: Sitting, Cuff Size: Normal)   Pulse 68   Temp 98.6 F (37 C) (Temporal)   Ht 5\' 11"  (1.803 m)   Wt 200 lb (90.7 kg)  SpO2 96%   BMI 27.89 kg/m   Wt Readings from Last 5 Encounters:  05/14/23 200 lb (90.7 kg)  05/04/23 199 lb (90.3 kg)  10/21/22 202 lb 13.2 oz (92 kg)  09/29/22 203 lb 12.8 oz (92.4 kg)  08/31/22 197 lb 9.6 oz (89.6 kg)    BMI Readings from Last 5 Encounters:  05/14/23 27.89 kg/m  05/04/23 27.75 kg/m  10/21/22 28.29 kg/m  09/29/22 28.42 kg/m  08/31/22 27.56 kg/m     Physical Exam General: Sitting in chair, in no acute distress Eyes: EOMI, no icterus Neck: Supple, no JVP Pulmonary: Distant, clear Cardiovascular: Warm, no edema Abdomen: Nondistended, bowel sounds present MSK: No synovitis, no joint effusion Neuro: Normal gait, no weakness Psych: Normal mood, full affect   Assessment & Plan:   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  Worsening cough and dyspnea.  Z-Pak and prednisone today.  Encouraged to resume and continue Breo after trial off of this prior to development of worsening symptoms.  History of tobacco abuse, baseline dyspnea: Encouraged to complete PFTs ordered recently.   Return in about 3 months (around 08/14/2023) for With Dr. Francine Graven.   Karren Burly, MD 05/19/2023

## 2023-05-24 ENCOUNTER — Ambulatory Visit: Payer: Medicare Other

## 2023-05-25 ENCOUNTER — Other Ambulatory Visit: Payer: Medicare Other

## 2023-05-27 ENCOUNTER — Ambulatory Visit: Payer: Medicare Other

## 2023-05-27 ENCOUNTER — Other Ambulatory Visit: Payer: Medicare Other

## 2023-05-27 DIAGNOSIS — R55 Syncope and collapse: Secondary | ICD-10-CM

## 2023-05-27 DIAGNOSIS — I2584 Coronary atherosclerosis due to calcified coronary lesion: Secondary | ICD-10-CM | POA: Diagnosis not present

## 2023-05-27 DIAGNOSIS — R0609 Other forms of dyspnea: Secondary | ICD-10-CM | POA: Diagnosis not present

## 2023-05-27 DIAGNOSIS — I2510 Atherosclerotic heart disease of native coronary artery without angina pectoris: Secondary | ICD-10-CM | POA: Diagnosis not present

## 2023-05-27 DIAGNOSIS — I4510 Unspecified right bundle-branch block: Secondary | ICD-10-CM

## 2023-05-31 ENCOUNTER — Ambulatory Visit: Payer: Medicare Other

## 2023-05-31 DIAGNOSIS — R0602 Shortness of breath: Secondary | ICD-10-CM

## 2023-05-31 DIAGNOSIS — I2510 Atherosclerotic heart disease of native coronary artery without angina pectoris: Secondary | ICD-10-CM

## 2023-05-31 DIAGNOSIS — I4510 Unspecified right bundle-branch block: Secondary | ICD-10-CM

## 2023-05-31 DIAGNOSIS — R55 Syncope and collapse: Secondary | ICD-10-CM

## 2023-05-31 DIAGNOSIS — I2584 Coronary atherosclerosis due to calcified coronary lesion: Secondary | ICD-10-CM | POA: Diagnosis not present

## 2023-05-31 DIAGNOSIS — R0609 Other forms of dyspnea: Secondary | ICD-10-CM

## 2023-06-09 NOTE — Progress Notes (Signed)
Called patient, [name] Hall, [name] Hall

## 2023-06-09 NOTE — Progress Notes (Signed)
Patient called back, I have discussed results with him.

## 2023-06-18 ENCOUNTER — Ambulatory Visit: Payer: Medicare Other | Admitting: Cardiology

## 2023-06-18 ENCOUNTER — Encounter: Payer: Self-pay | Admitting: Cardiology

## 2023-06-18 VITALS — BP 104/66 | HR 81 | Resp 16 | Ht 71.0 in | Wt 208.2 lb

## 2023-06-18 DIAGNOSIS — I2510 Atherosclerotic heart disease of native coronary artery without angina pectoris: Secondary | ICD-10-CM

## 2023-06-18 DIAGNOSIS — I2584 Coronary atherosclerosis due to calcified coronary lesion: Secondary | ICD-10-CM | POA: Diagnosis not present

## 2023-06-18 DIAGNOSIS — I951 Orthostatic hypotension: Secondary | ICD-10-CM

## 2023-06-18 DIAGNOSIS — D6851 Activated protein C resistance: Secondary | ICD-10-CM

## 2023-06-18 DIAGNOSIS — Z86711 Personal history of pulmonary embolism: Secondary | ICD-10-CM

## 2023-06-18 DIAGNOSIS — R0609 Other forms of dyspnea: Secondary | ICD-10-CM | POA: Diagnosis not present

## 2023-06-18 DIAGNOSIS — Z86718 Personal history of other venous thrombosis and embolism: Secondary | ICD-10-CM | POA: Diagnosis not present

## 2023-06-18 DIAGNOSIS — E119 Type 2 diabetes mellitus without complications: Secondary | ICD-10-CM | POA: Diagnosis not present

## 2023-06-18 DIAGNOSIS — I4510 Unspecified right bundle-branch block: Secondary | ICD-10-CM

## 2023-06-18 DIAGNOSIS — R55 Syncope and collapse: Secondary | ICD-10-CM | POA: Diagnosis not present

## 2023-06-18 NOTE — Progress Notes (Signed)
ID:  [name] Hall, DOB 08/29/1946, MRN [id number]  PCP:  Daisy Floro, MD  Cardiologist:  Tessa Lerner, DO, [facility] (established care 05/04/23) Former Cardiology Providers : Dr. Donato Schultz Cardiology Providers at [facility]: Dr. Pete Pelt  Date: 06/18/23 Last Office Visit: 05/04/2023  Chief Complaint  Patient presents with   Hypertension   Shortness of Breath    HPI  [name] Hall is a 77 y.o. Caucasian male whose past medical history and cardiovascular risk factors include: History of orthostatic hypotension, hyperlipidemia, NIDDM Type II, history of DVT/PE, former smoker, factor V Leiden mutation, history of alcoholism/depression, history of TIA/stroke (right frontal lobe infarct provided by PCP records), AAA, status post EVAR with left iliac branch device (Dr. Lenell Antu 08/2022), history of intracranial hemorrhage (08/2014 records provided by PCP)  Patient was referred to the practice for evaluation of dyspnea on exertion and near syncope.  Dyspnea on exertion: At last office visit this the decision was to proceed with echocardiogram and stress test to evaluate for structural heart disease and reversible ischemia, respectively.  Results reviewed with him in detail and noted below for further reference.  Since last office visit patient states that there is some improvement in dyspnea but he continues to have it with activities of daily living.  He has followed up with pulmonary medicine and was restarted on Breo without any significant improvement.  He denies anginal chest pain at rest or with effort related activities.  In the recent past he has had CT of the chest which was negative for pulmonary embolism per report but given his history of DVT/PE/factor V Leiden deficiency.  Near-syncope: At last office visit he was having episodes of near syncope/orthostasis.  His orthostatic vital signs were also positive.  I asked him to discontinue tamsulosin and to reach out to his urologist for  different medication for BPH.  Patient states that after stopping tamsulosin his symptoms of orthostasis has significantly improved.  His urology did not transition him to a different medication as of yet.  Patient states that his last stress test was approximately 2 years ago with Dr. Jodie Echevaria at the [facility].  No prior history of coronary interventions.  CT PE protocol study does note coronary artery calcification.  He does have a history of abdominal aortic aneurysm status post EVAR with Dr. Juanetta Gosling in October 2023.  FUNCTIONAL STATUS: 0.5 miles walking three times a week.    CARDIAC DATABASE: EKG: May 04, 2023: Sinus rhythm, 79 bpm, first-degree AV block, right bundle branch block, ST-T changes likely secondary to RBBB but ischemia cannot be ruled out.  Echocardiogram: 05/27/2023:  Normal LV systolic function with visual EF 60-65%. Left ventricle cavity  is normal in size. Normal left ventricular wall thickness. Normal global  wall motion. Normal diastolic filling pattern, normal LAP.  Aortic valve sclerosis without stenosis.  No significant valvular heart disease.  Compared to 04/14/2012 no significant change.    Stress Test: Lexiscan Tetrofosmin (Walking) stress test 05/31/2023: 1 Day Rest/Stress protocol.  Exercise time 4 minutes 00 seconds on Bruce protocol, achieved 2.30 METS, 69 % of age-predicted maximum heart rate (APMHR).  Stress ECG nondiagnostic for ischemia-Target heart rate not achieved. Normal myocardial perfusion without convincing evidence of reversible ischemia or prior infarct. Calculated LVEF 65%, wall thickness preserved, no regional wall motion abnormalities. Low risk study. Prior study April 2023 in Care Everywhere noted small sized mild intensity apical defect either due to reversible ischemia or secondary to artifact.  LVEF per  gated SPECT 78% and no segmental wall motion abnormalities therefore study was considered low risk.   Carotid duplex: Apr 29, 2016 Less than 50% stenosis in the right and left internal carotid arteries.    RADIOLOGY: CT abdomen pelvis with and without contrast: September 28, 2022 IMPRESSION: VASCULAR 1. Status post aortobi-iliac and branched left iliac endograft repair of infrarenal abdominal aortic and left iliac artery aneurysms. No complicating features, specifically no evidence of endoleak or aneurysm sac enlargement. 2.  Aortic Atherosclerosis (ICD10-I70.0).   NON-VASCULAR   1. Diverticulosis. 2. Multilevel degenerative changes of the lumbar spine.   Marliss Coots, MD   ALLERGIES: Allergies  Allergen Reactions   Degarelix     Other Reaction(s): bowel obstruction   Duloxetine Swelling    Other Reaction(s): Hypotension   Pregabalin     Other reaction(s): Dizziness (intolerance), Low blood pressure, Other (See Comments), UNSTEADY GAIT  Other Reaction(s): Dizziness, Other (See Comments)   Ramipril Hives    Other Reaction(s): hives   Sulfonamide Derivatives     Other Reaction(s): hives   Oxycodone Itching and Rash    MEDICATION LIST PRIOR TO VISIT: Current Meds  Medication Sig   acetaminophen (TYLENOL) 650 MG CR tablet Take 650 mg by mouth 2 (two) times daily.   apixaban (ELIQUIS) 2.5 MG TABS tablet Take 2.5 mg by mouth 2 (two) times daily.   atorvastatin (LIPITOR) 80 MG tablet Take 80 mg by mouth at bedtime.   fluticasone furoate-vilanterol (BREO ELLIPTA) 100-25 MCG/ACT AEPB Inhale 1 puff into the lungs daily.   lithium carbonate 150 MG capsule Take 450 mg by mouth 2 (two) times daily with a meal.   metFORMIN (GLUCOPHAGE) 500 MG tablet Take 500 mg by mouth daily.    ONE TOUCH ULTRA TEST test strip      PAST MEDICAL HISTORY: Past Medical History:  Diagnosis Date   Bipolar disorder (HCC)    Diabetes mellitus    type 2   DVT (deep venous thrombosis) (HCC) 2006   leg after joint replacement    Dyspnea    with exertion occ   ED (erectile dysfunction)    Factor 5 Leiden mutation,  heterozygous (HCC)    Hepatitis    41 yrs ago hepatitis a   Hyperlipidemia    Orthostatic hypotension    Osteoarthritis    PE (pulmonary embolism) 01/27/2017   after right tha revision   PERSONAL HISTORY, VENOUS THROMBOSIS AND EMBOLISM    Prostate CA (HCC) 2012   treated with radiation   PROSTATE CANCER    Stroke (HCC) 15-20 yrs ago, saw on mri   TIA no residual from    PAST SURGICAL HISTORY: Past Surgical History:  Procedure Laterality Date   ABDOMINAL AORTIC ENDOVASCULAR STENT GRAFT Bilateral 08/31/2022   Procedure: ABDOMINAL AORTIC ENDOVASCULAR STENT GRAFT;  Surgeon: Leonie Douglas, MD;  Location: [facility] OR;  Service: Vascular;  Laterality: Bilateral;   CATARACT EXTRACTION Bilateral    colonoscopy     polyp located and tested negative   COLONOSCOPY WITH PROPOFOL  10/2003, [id number]   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   Procedure: PENILE PROTHESIS INFLATABLE;  Surgeon: Marcine Matar, MD;  Location: [facility];  Service: Urology;  Laterality: N/A;   TONSILLECTOMY AND ADENOIDECTOMY     AS A CHILD   TOTAL HIP ARTHROPLASTY     Left and right hip replacements   TOTAL HIP REVISION Right 01/27/2017   Procedure: RIGHT TOTAL HIP REVISION;  Surgeon: Ollen Gross, MD;  Location: [facility] ORS;  Service: Orthopedics;  Laterality: Right;    FAMILY HISTORY: The patient family history includes Depression in his sister; Heart attack in his father; Hyperlipidemia in his father and mother; Hypertension in his father and mother; Sudden death in his father.  SOCIAL HISTORY:  The patient  reports that he quit smoking about 9 years ago. His smoking use included cigarettes. He started smoking about 39 years ago. He has a 30 pack-year smoking history. He has never used smokeless tobacco. He reports that he does not drink alcohol and does not use drugs.  REVIEW OF SYSTEMS: Review of Systems  Cardiovascular:  Positive for dyspnea on exertion and near-syncope. Negative for chest  pain, claudication, irregular heartbeat, leg swelling, orthopnea, palpitations, paroxysmal nocturnal dyspnea and syncope.  Respiratory:  Positive for shortness of breath.   Hematologic/Lymphatic: Negative for bleeding problem.  Musculoskeletal:  Negative for muscle cramps and myalgias.    PHYSICAL EXAM:    06/18/2023    9:42 AM 05/14/2023    2:23 PM 05/04/2023    9:56 AM  Vitals with BMI  Height 5\' 11"  5\' 11"  5\' 11"   Weight 208 lbs 3 oz 200 lbs 199 lbs  BMI 29.05 27.91 27.77  Systolic 104 102 87  Diastolic 66 70 61  Pulse 81 68 68   Orthostatic VS for the past 72 hrs (Last 3 readings):  Orthostatic BP Patient Position BP Location Cuff Size Orthostatic Pulse  06/18/23 1016 (!) 72/50 Standing Left Arm Normal 73  06/18/23 1015 93/59 Sitting Left Arm Normal 66  06/18/23 1014 113/65 Supine Left Arm Large 67   Physical Exam  Constitutional: No distress.  Age appropriate, hemodynamically stable.   Neck: No JVD present.  Cardiovascular: Normal rate, regular rhythm, S1 normal, S2 normal, intact distal pulses and normal pulses. Exam reveals no gallop, no S3 and no S4.  No murmur heard. Pulmonary/Chest: Effort normal and breath sounds normal. No stridor. He has no wheezes. He has no rales.  Abdominal: Soft. Bowel sounds are normal. He exhibits no distension. There is no abdominal tenderness.  Musculoskeletal:        General: No edema.     Cervical back: Neck supple.     Comments: Bilateral compression stockings  Neurological: He is alert and oriented to person, place, and time. He has intact cranial nerves (2-12).  Skin: Skin is warm and moist.    LABORATORY DATA:    Latest Ref Rng & Units 10/21/2022   12:52 PM 09/01/2022    5:00 AM 08/31/2022   11:00 AM  CBC  WBC 4.0 - 10.5 K/uL 8.1  10.8  5.7   Hemoglobin 13.0 - 17.0 g/dL 16.1  09.6  04.5   Hematocrit 39.0 - 52.0 % 40.4  34.2  37.0   Platelets 150 - 400 K/uL 172  107  90        Latest Ref Rng & Units 10/21/2022   12:52 PM  09/01/2022    5:00 AM 08/31/2022   11:00 AM  CMP  Glucose 70 - 99 mg/dL 89  409  811   BUN 8 - 23 mg/dL 20  18  18    Creatinine 0.61 - 1.24 mg/dL 9.14  7.82  9.56   Sodium 135 - 145 mmol/L 137  133  136   Potassium 3.5 - 5.1 mmol/L 4.2  4.5  4.3   Chloride 98 - 111 mmol/L 107  108  110   CO2 22 - 32 mmol/L 26  21  22   Calcium 8.9 - 10.3 mg/dL 9.7  8.9  8.5   Total Protein 6.5 - 8.1 g/dL 7.5     Total Bilirubin 0.3 - 1.2 mg/dL 0.7     Alkaline Phos 38 - 126 U/L 111     AST 15 - 41 U/L 18     ALT 0 - 44 U/L 14       Lab Results  Component Value Date   CHOL  12/23/2007    166        ATP III CLASSIFICATION:  <200     mg/dL   Desirable  540-981  mg/dL   Borderline High  >=191    mg/dL   High   HDL 61 47/82/9562   LDLCALC  12/23/2007    91        Total Cholesterol/HDL:CHD Risk Coronary Heart Disease Risk Table                     Men   Women  1/2 Average Risk   3.4   3.3   TRIG 70 12/23/2007   CHOLHDL 2.7 12/23/2007   No components found for: "NTPROBNP" No results for input(s): "PROBNP" in the last 8760 hours. No results for input(s): "TSH" in the last 8760 hours.  BMP Recent Labs    08/31/22 1100 09/01/22 0500 10/21/22 1252  NA 136 133* 137  K 4.3 4.5 4.2  CL 110 108 107  CO2 22 21* 26  GLUCOSE 103* 145* 89  BUN 18 18 20   CREATININE 1.24 1.05 1.33*  CALCIUM 8.5* 8.9 9.7  GFRNONAA >60 >60 55*    HEMOGLOBIN A1C Lab Results  Component Value Date   HGBA1C 5.6 01/20/2017   MPG 114 01/20/2017   External Labs: Collected: 03/31/2023 A1c 6.2. Hemoglobin 13.8, hematocrit 42.4%. BUN 32, creatinine 1.36. eGFR 54. Sodium 137, potassium 4.3, chloride 107, bicarb 26. AST 11, ALT 13, alkaline phosphatase 90  External Labs: Collected: Apr 27, 2023 at [facility], Care Everywhere. Sodium 138, potassium 4.4, chloride 108, bicarb 23,  EGFR 64. BUN 20, creatinine 1.18 AST 12, ALT 14, alkaline phosphatase 98 Hemoglobin 13.4, hematocrit 39.2% D-dimer 2210 High  sensitive troponins negative x 2. BNP 89.  IMPRESSION:    ICD-10-CM   1. DOE (dyspnea on exertion)  R06.09 Cardiopulmonary exercise test    2. Coronary atherosclerosis due to calcified coronary lesion  I25.10    I25.84     3. RBBB  I45.10     4. Orthostatic hypotension  I95.1     5. Hx of deep venous thrombosis  Z86.718     6. Hx of pulmonary embolus  Z86.711     7. Factor V Leiden mutation (HCC)  D68.51     8. Non-insulin dependent type 2 diabetes mellitus (HCC)  E11.9         RECOMMENDATIONS: ATZIN BUCHTA is a 77 y.o. Caucasian male whose past medical history and cardiac risk factors include: History of orthostatic hypotension, hyperlipidemia, NIDDM Type II, history of DVT/PE, former smoker, factor V Leiden mutation, history of alcoholism/depression, history of TIA/stroke (right frontal lobe infarct provided by PCP records), AAA, status post EVAR with left iliac branch device (Dr. Lenell Antu 08/2022), history of intracranial hemorrhage (08/2014 records provided by PCP).  DOE (dyspnea on exertion) Coronary atherosclerosis due to calcified coronary lesion RBBB Denies anginal chest pain. Echo: Preserved LVEF, normal diastolic function, no significant valvular heart disease. MPI: Low risk study. However symptomatically has profound dyspnea  with activities of daily living.  Until the myocardial perfusion is normal cannot rule out entirely balanced ischemia.  Given the degree of his symptoms recommended left and right heart catheterization to evaluate for obstructive disease and hemodynamics respectively.  Patient states that he would like to hold off on angiography at this time as he may have to go to the [facility] to have it done to minimize his cost.  Patient is aware to seek medical attention sooner bilateral posterior via EMS if his dyspnea were to get progressive. Will order cardiopulmonary stress test to further evaluate etiology of his dyspnea. Further recommendations to  follow.  Near syncope Orthostatic hypotension At last office visit tamsulosin was discontinued.  This significantly helped his symptoms. His orthostatic vital signs are still positive but symptomatically he has improved. Former Contractor drink in 2015 per patient. Encouraged him to increase physical activity as tolerated. Encouraged him to increase hydration. Given his history of DVT/PE/factor V Leiden deficiency he had a CT PE protocol which was negative for pulmonary embolism. Zio patch to evaluate for dysrhythmias-results are still pending.  Will have to reach out to him with the results when made available.  Hx of deep venous thrombosis Hx of pulmonary embolus Factor V Leiden mutation (HCC) Was on Coumadin for long-term recently transition to Eliquis. I have asked him to establish care with hematology to discuss anticoagulation management.  Will defer to PCP.  Non-insulin dependent type 2 diabetes mellitus (HCC) Hemoglobin A1c well-controlled. Currently on statin therapy. Will hold off ACE inhibitor/ARB given his orthostatic hypotension. Lipids are currently managed by PCP.   FINAL MEDICATION LIST END OF ENCOUNTER: No orders of the defined types were placed in this encounter.   Medications Discontinued During This Encounter  Medication Reason   azithromycin (ZITHROMAX) 250 MG tablet    tamsulosin (FLOMAX) 0.4 MG CAPS capsule    Vibegron (GEMTESA) 75 MG TABS      Current Outpatient Medications:    acetaminophen (TYLENOL) 650 MG CR tablet, Take 650 mg by mouth 2 (two) times daily., Disp: , Rfl:    apixaban (ELIQUIS) 2.5 MG TABS tablet, Take 2.5 mg by mouth 2 (two) times daily., Disp: , Rfl:    atorvastatin (LIPITOR) 80 MG tablet, Take 80 mg by mouth at bedtime., Disp: , Rfl:    fluticasone furoate-vilanterol (BREO ELLIPTA) 100-25 MCG/ACT AEPB, Inhale 1 puff into the lungs daily., Disp: 28 each, Rfl: 6   lithium carbonate 150 MG capsule, Take 450 mg by mouth 2 (two)  times daily with a meal., Disp: , Rfl:    metFORMIN (GLUCOPHAGE) 500 MG tablet, Take 500 mg by mouth daily. , Disp: , Rfl:    ONE TOUCH ULTRA TEST test strip, , Disp: , Rfl:   Orders Placed This Encounter  Procedures   Cardiopulmonary exercise test    There are no Patient Instructions on file for this visit.   --Continue cardiac medications as reconciled in final medication list. --Return in about 3 months (around 09/18/2023) for Follow up, Dyspnea. or sooner if needed. --Continue follow-up with your primary care physician regarding the management of your other chronic comorbid conditions.  Patient's questions and concerns were addressed to his satisfaction. He voices understanding of the instructions provided during this encounter.   This note was created using a voice recognition software as a result there may be grammatical errors inadvertently enclosed that do not reflect the nature of this encounter. Every attempt is made to correct such errors.   [place],  DO, [facility]  Pager:  [phone] Office: [phone]

## 2023-06-21 ENCOUNTER — Telehealth: Payer: Self-pay | Admitting: Pulmonary Disease

## 2023-06-22 ENCOUNTER — Telehealth: Payer: Self-pay

## 2023-06-22 NOTE — Telephone Encounter (Signed)
Patient states he has been having SOB ever since switching over to Apixaban . Patient states his primary doctor states he should go back on warfarin and stop Apixaban

## 2023-06-24 MED ORDER — FLUTICASONE FUROATE-VILANTEROL 100-25 MCG/ACT IN AEPB: 1.0000 | INHALATION_SPRAY | Freq: Every day | RESPIRATORY_TRACT | 5 refills | Status: DC

## 2023-06-24 NOTE — Telephone Encounter (Signed)
He is currently on anticoagulation for non-cardiac conditions (Hx of deep venous thrombosis Hx of pulmonary embolus, Factor V Leiden mutation) will defer management to PCP and hematology.   Thanks,    [place], DO, [facility]

## 2023-06-24 NOTE — Telephone Encounter (Signed)
ATC x1 LVM for patient to call our office back. 

## 2023-06-24 NOTE — Telephone Encounter (Signed)
Patient is returning a call regarding his medication.  Please call patient back to discuss.  CB# [phone]

## 2023-06-24 NOTE — Telephone Encounter (Signed)
Called and spoke with patient.  Patient stated current Breo prescription is not for 1 month.  He is received 2 weeks worth of Breo at a time.  Patient requested a new Breo prescription be sent to [facility] for more then 2 weeks worth.  Gertie Baron prescription sent to requested pharmacy.  Nothing further at this time.

## 2023-07-07 ENCOUNTER — Ambulatory Visit (INDEPENDENT_AMBULATORY_CARE_PROVIDER_SITE_OTHER): Payer: Medicare Other | Admitting: Pulmonary Disease

## 2023-07-07 ENCOUNTER — Encounter: Payer: Self-pay | Admitting: Cardiology

## 2023-07-07 DIAGNOSIS — J432 Centrilobular emphysema: Secondary | ICD-10-CM | POA: Diagnosis not present

## 2023-07-07 DIAGNOSIS — R55 Syncope and collapse: Secondary | ICD-10-CM | POA: Diagnosis not present

## 2023-07-07 LAB — PULMONARY FUNCTION TEST
DL/VA % pred: 88 %
DL/VA: 3.5 ml/min/mmHg/L
DLCO cor % pred: 78 %
DLCO cor: 20.11 ml/min/mmHg
DLCO unc % pred: 78 %
DLCO unc: 20.11 ml/min/mmHg
FEF 25-75 Pre: 1.12 L/sec
FEF2575-%Pred-Pre: 49 %
FEV1-%Pred-Pre: 63 %
FEV1-Pre: 2.01 L
FEV1FVC-%Pred-Pre: 76 %
FEV6-%Pred-Pre: 87 %
FEV6-Pre: 3.59 L
FEV6FVC-%Pred-Pre: 104 %
FVC-%Pred-Pre: 83 %
FVC-Pre: 3.65 L
Pre FEV1/FVC ratio: 55 %
Pre FEV6/FVC Ratio: 98 %
RV % pred: 109 %
RV: 2.87 L
TLC % pred: 90 %
TLC: 6.57 L

## 2023-07-07 NOTE — Telephone Encounter (Signed)
I had plans to discuss the monitor results at follow up.  Avg HR 70bpm.  No patient triggered events No Afib during the monitor.   Please refer back to telephone encounter from 06/22/2023 - I did respond back.   Regards,    [place], DO, [facility]

## 2023-07-07 NOTE — Telephone Encounter (Signed)
Called pt and let him know his results for his monitor reading as well as to follow up with his PCP and hematologist

## 2023-07-07 NOTE — Telephone Encounter (Signed)
From patient.

## 2023-07-07 NOTE — Patient Instructions (Signed)
Full PFT performed today. °

## 2023-07-07 NOTE — Progress Notes (Signed)
Full PFT performed today. °

## 2023-07-08 ENCOUNTER — Encounter: Payer: Self-pay | Admitting: Cardiology

## 2023-08-05 ENCOUNTER — Encounter: Payer: Self-pay | Admitting: Pulmonary Disease

## 2023-08-30 DIAGNOSIS — M47819 Spondylosis without myelopathy or radiculopathy, site unspecified: Secondary | ICD-10-CM | POA: Diagnosis not present

## 2023-09-17 ENCOUNTER — Ambulatory Visit: Payer: Self-pay | Admitting: Cardiology

## 2023-09-27 ENCOUNTER — Other Ambulatory Visit: Payer: Self-pay | Admitting: *Deleted

## 2023-09-27 DIAGNOSIS — Z9889 Other specified postprocedural states: Secondary | ICD-10-CM

## 2023-10-04 NOTE — Progress Notes (Deleted)
[facility] OF [place]  ASSESSMENT / PLAN: [name] Hall is a 77 y.o. 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   Recommend the following to reduce the risk of major adverse cardiac / limb events.  Complete cessation from all tobacco products. Blood glucose control with goal A1c < 7%. Blood pressure control with goal blood pressure < 140/90 mmHg. Lipid reduction therapy with goal LDL-C <100 mg/dL. Aspirin 81mg  PO QD.  Atorvastatin 40-80mg  PO QD (or other "high intensity" statin therapy).  Good technical result noted on CT angiogram. Follow up annually with duplex of AAA.   CHIEF COMPLAINT: Aneurysm  HISTORY OF PRESENT ILLNESS: [name] Hall is a 77 y.o. male referred to clinic for evaluation of enlarging abdominal aortic aneurysm and common iliac artery aneurysm.  The patient has been followed by the [facility] in [place] for the same.  He is asymptomatic from a aneurysm standpoint.  He reports history of smoking, but no family history of aneurysm disease, or connective tissue disease.  The bulk of our visit was spent reviewing the rationale for intervention, and the different options available for intervention.  09/29/22: Patient returns after EVAR with iliac branch device.  He tolerated this very well.  He does report some left groin pain, which is being worked up for possible hernia.  VASCULAR SURGICAL HISTORY: None  VASCULAR RISK FACTORS: Positive history of stroke / transient ischemic attack. Negative history of coronary artery disease.  Negative history of diabetes mellitus. Positive history of smoking. Not actively smoking. Positive history of hypertension.  Negative history of chronic kidney disease.   Negative history of chronic obstructive pulmonary disease.  FUNCTIONAL STATUS: ECOG performance status: (0) Fully active, able to carry on all predisease  performance without restriction Ambulatory status: Ambulatory within the community without limits  [name] (2pts) 75-79 or 80-84 (2pts) >84 (3pts) Dependence in toileting (1pt) Partial or full dependence in dressing (1pt) History of malignant neoplasm (2pts) CHF (3pts) COPD (1pts) CKD (3pts)  0-3 pts 6% 1 year mortality ; 21% 3 year mortality 4-5 pts 12% 1 year mortality ; 36% 3 year mortality >5 pts 21% 1 year mortality; 54% 3 year mortality   Past Medical History:  Diagnosis Date   Bipolar disorder (HCC)    Diabetes mellitus    type 2   DVT (deep venous thrombosis) (HCC) 2006   leg after joint replacement    Dyspnea    with exertion occ   ED (erectile dysfunction)    Factor 5 Leiden mutation, heterozygous (HCC)    Hepatitis    41 yrs ago hepatitis a   Hyperlipidemia    Orthostatic hypotension    Osteoarthritis    PE (pulmonary embolism) 01/27/2017   after right tha revision   PERSONAL HISTORY, VENOUS THROMBOSIS AND EMBOLISM    Prostate CA (HCC) 2012   treated with radiation   PROSTATE CANCER    Stroke (HCC) 15-20 yrs ago, saw on mri   TIA no residual from    Past Surgical History:  Procedure Laterality Date   ABDOMINAL AORTIC ENDOVASCULAR STENT GRAFT Bilateral 08/31/2022   Procedure: ABDOMINAL AORTIC ENDOVASCULAR STENT GRAFT;  Surgeon: Leonie Douglas, MD;  [facility];  Service: Vascular;  Laterality: Bilateral;   CATARACT EXTRACTION Bilateral    colonoscopy     polyp located and tested negative   COLONOSCOPY WITH  PROPOFOL  10/2003, [id number]   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   Procedure: PENILE PROTHESIS INFLATABLE;  Surgeon: Marcine Matar, MD;  Location: [facility];  Service: Urology;  Laterality: N/A;   TONSILLECTOMY AND ADENOIDECTOMY     AS A CHILD   TOTAL HIP ARTHROPLASTY     Left and right hip replacements   TOTAL HIP REVISION Right 01/27/2017   Procedure: RIGHT TOTAL HIP REVISION;  Surgeon:  Ollen Gross, MD;  Location: [facility] ORS;  Service: Orthopedics;  Laterality: Right;    Family History  Problem Relation Age of Onset   Hyperlipidemia Mother    Hypertension Mother    Heart attack Father    Hyperlipidemia Father    Hypertension Father    Sudden death Father    Depression Sister     Social History   Socioeconomic History   Marital status: Married    Spouse name: Not on file   Number of children: Not on file   Years of education: Not on file   Highest education level: Not on file  Occupational History   Not on file  Tobacco Use   Smoking status: Former    Current packs/day: 0.00    Average packs/day: 1 pack/day for 30.0 years (30.0 ttl pk-yrs)    Types: Cigarettes    Start date: 02/23/1984    Quit date: 02/22/2014    Years since quitting: 9.6   Smokeless tobacco: Never  Vaping Use   Vaping status: Never Used  Substance and Sexual Activity   Alcohol use: No   Drug use: No   Sexual activity: Never  Other Topics Concern   Not on file  Social History Narrative   Not on file   Social Determinants of Health   Financial Resource Strain: Not on file  Food Insecurity: No Food Insecurity (02/17/2021)   Received from [facility] visits prior to 01/30/2023., [facility] visits prior to 01/30/2023.   Hunger Vital Sign    Worried About Programme researcher, broadcasting/film/video in the Last Year: Never true    Ran Out of Food in the Last Year: Never true  Transportation Needs: Not on file  Physical Activity: Not on file  Stress: Not on file  Social Connections: Not on file  Intimate Partner Violence: Not on file    Allergies  Allergen Reactions   Degarelix     Other Reaction(s): bowel obstruction   Duloxetine Swelling    Other Reaction(s): Hypotension   Pregabalin     Other reaction(s): Dizziness (intolerance), Low blood pressure, Other (See Comments), UNSTEADY GAIT  Other Reaction(s): Dizziness, Other (See Comments)   Ramipril Hives     Other Reaction(s): hives   Sulfonamide Derivatives     Other Reaction(s): hives   Oxycodone Itching and Rash    Current Outpatient Medications  Medication Sig Dispense Refill   acetaminophen (TYLENOL) 650 MG CR tablet Take 650 mg by mouth 2 (two) times daily.     apixaban (ELIQUIS) 2.5 MG TABS tablet Take 2.5 mg by mouth 2 (two) times daily.     atorvastatin (LIPITOR) 80 MG tablet Take 80 mg by mouth at bedtime.     fluticasone furoate-vilanterol (BREO ELLIPTA) 100-25 MCG/ACT AEPB Inhale 1 puff into the lungs daily. 60 each 5   lithium carbonate 150 MG capsule Take 450 mg by mouth 2 (two) times daily with a meal.     metFORMIN (GLUCOPHAGE) 500 MG tablet Take 500 mg by mouth  daily.      ONE TOUCH ULTRA TEST test strip      No current facility-administered medications for this visit.    PHYSICAL EXAM There were no vitals filed for this visit.   Well-appearing gentleman in no acute distress Regular rate and rhythm Unlabored breathing Nondistended abdomen Access sites clean and dry without evidence of pseudoaneurysm.  Easily palpable femoral pulses.  PERTINENT LABORATORY AND RADIOLOGIC DATA  Most recent CBC    Latest Ref Rng & Units 10/21/2022   12:52 PM 09/01/2022    5:00 AM 08/31/2022   11:00 AM  CBC  WBC 4.0 - 10.5 K/uL 8.1  10.8  5.7   Hemoglobin 13.0 - 17.0 g/dL 16.1  09.6  04.5   Hematocrit 39.0 - 52.0 % 40.4  34.2  37.0   Platelets 150 - 400 K/uL 172  107  90      Most recent CMP    Latest Ref Rng & Units 10/21/2022   12:52 PM 09/01/2022    5:00 AM 08/31/2022   11:00 AM  CMP  Glucose 70 - 99 mg/dL 89  409  811   BUN 8 - 23 mg/dL 20  18  18    Creatinine 0.61 - 1.24 mg/dL 9.14  7.82  9.56   Sodium 135 - 145 mmol/L 137  133  136   Potassium 3.5 - 5.1 mmol/L 4.2  4.5  4.3   Chloride 98 - 111 mmol/L 107  108  110   CO2 22 - 32 mmol/L 26  21  22    Calcium 8.9 - 10.3 mg/dL 9.7  8.9  8.5   Total Protein 6.5 - 8.1 g/dL 7.5     Total Bilirubin 0.3 - 1.2 mg/dL 0.7      Alkaline Phos 38 - 126 U/L 111     AST 15 - 41 U/L 18     ALT 0 - 44 U/L 14       Renal function CrCl cannot be calculated (Patient's most recent lab result is older than the maximum 21 days allowed.).  Hgb A1c MFr Bld (%)  Date Value  01/20/2017 5.6    LDL Cholesterol  Date Value Ref Range Status  12/23/2007   Final   91        Total Cholesterol/HDL:CHD Risk Coronary Heart Disease Risk Table                     Men   Women  1/2 Average Risk   3.4   3.3    Postoperative CT angiogram shows good technical result of EVAR with IBD.  No evidence of endoleak.  Rande Brunt. Lenell Antu, MD [facility] Phone Number: [phone] 10/04/2023 4:23 PM  Total time spent on preparing this encounter including chart review, data review, collecting history, examining the patient, coordinating care for this patient, 20 minutes.  Portions of this report may have been transcribed using voice recognition software.  Every effort has been made to ensure accuracy; however, inadvertent computerized transcription errors may still be present.

## 2023-10-05 ENCOUNTER — Encounter: Payer: Self-pay | Admitting: Pulmonary Disease

## 2023-10-05 ENCOUNTER — Ambulatory Visit: Payer: Medicare Other | Admitting: Pulmonary Disease

## 2023-10-05 ENCOUNTER — Ambulatory Visit: Payer: No Typology Code available for payment source | Admitting: Vascular Surgery

## 2023-10-05 ENCOUNTER — Ambulatory Visit (HOSPITAL_COMMUNITY): Payer: No Typology Code available for payment source | Attending: Family Medicine

## 2023-10-05 ENCOUNTER — Encounter (HOSPITAL_COMMUNITY): Payer: Self-pay

## 2023-10-05 VITALS — BP 112/58 | HR 72 | Temp 98.3°F | Ht 71.0 in | Wt 212.4 lb

## 2023-10-05 DIAGNOSIS — J432 Centrilobular emphysema: Secondary | ICD-10-CM

## 2023-10-05 MED ORDER — TRELEGY ELLIPTA 100-62.5-25 MCG/ACT IN AEPB: 1.0000 | INHALATION_SPRAY | Freq: Every day | RESPIRATORY_TRACT | Status: DC

## 2023-10-05 NOTE — Progress Notes (Unsigned)
Synopsis: Referred in December 2022 for COPD by Daisy Floro, MD  Subjective:   PATIENT ID: [name] Hall: male DOB: May 19, 1946, MRN: [id number]  HPI  Chief Complaint  Patient presents with   Follow-up    SOB when patient stands up and walks short distance.   [name] Hall is a 77 year old male, former smoker with hypertension, DMII, DVT/PE, and Factor V Leiden deficiency who returns to pulmonary clinic for COPD follow up.   He was seen 05/14/23 by Dr. Judeth Horn for COPD exacerbation.  OV 12/30/21 He was started on breo ellipta 100-43mcg 1 puff daily for concern of obstructive lung disease which he took for 3 weeks and the cough subsided. He is not taking the breo now. He is taking zyrtec plus fluticasone for post-nasal drainage. He is currently having very little sinus congestion and drainage now. He is mainly coughing in the morning time, clear phlegm. Experiencing dyspnea on exertion.   He was given an inhaler by his PCP but says this was too harsh.   OV 10/30/21 He reports having cough for 6-8 weeks. He recently tested positive for covid 11/15 and again 11/30. He was started on augmentin by his PCP 11/30 and benzonatate. He started to use Zyrtec daily over the last month. He does not know if he has seasonal allergies.   The cough is intermittently productive. He has some sinus congestion and intermittent drainage. He does have some dyspnea with the cough. He denies heartburn.   He is a former smoker, undergoing lung cancer screening at the [place]. Has scan on 12/9 coming up. He was in the roofing business. Has agent orange exposure.   Past Medical History:  Diagnosis Date   Bipolar disorder (HCC)    Diabetes mellitus    type 2   DVT (deep venous thrombosis) (HCC) 2006   leg after joint replacement    Dyspnea    with exertion occ   ED (erectile dysfunction)    Factor 5 Leiden mutation, heterozygous (HCC)    Hepatitis    41 yrs ago hepatitis a   Hyperlipidemia     Orthostatic hypotension    Osteoarthritis    PE (pulmonary embolism) 01/27/2017   after right tha revision   PERSONAL HISTORY, VENOUS THROMBOSIS AND EMBOLISM    Prostate CA (HCC) 2012   treated with radiation   PROSTATE CANCER    Stroke (HCC) 15-20 yrs ago, saw on mri   TIA no residual from     Family History  Problem Relation Age of Onset   Hyperlipidemia Mother    Hypertension Mother    Heart attack Father    Hyperlipidemia Father    Hypertension Father    Sudden death Father    Depression Sister      Social History   Socioeconomic History   Marital status: Married    Spouse name: Not on file   Number of children: Not on file   Years of education: Not on file   Highest education level: Not on file  Occupational History   Not on file  Tobacco Use   Smoking status: Former    Current packs/day: 0.00    Average packs/day: 1 pack/day for 30.0 years (30.0 ttl pk-yrs)    Types: Cigarettes    Start date: 02/23/1984    Quit date: 02/22/2014    Years since quitting: 9.6   Smokeless tobacco: Never  Vaping Use   Vaping status: Never Used  Substance and Sexual  Activity   Alcohol use: No   Drug use: No   Sexual activity: Never  Other Topics Concern   Not on file  Social History Narrative   Not on file   Social Determinants of Health   Financial Resource Strain: Not on file  Food Insecurity: No Food Insecurity (02/17/2021)   Received from [facility] prior to 01/30/2023., [facility] visits prior to 01/30/2023.   Hunger Vital Sign    Worried About Programme researcher, broadcasting/film/video in the Last Year: Never true    Ran Out of Food in the Last Year: Never true  Transportation Needs: Not on file  Physical Activity: Not on file  Stress: Not on file  Social Connections: Not on file  Intimate Partner Violence: Not on file     Allergies  Allergen Reactions   Degarelix     Other Reaction(s): bowel obstruction   Duloxetine Swelling     Other Reaction(s): Hypotension   Pregabalin     Other reaction(s): Dizziness (intolerance), Low blood pressure, Other (See Comments), UNSTEADY GAIT  Other Reaction(s): Dizziness, Other (See Comments)   Ramipril Hives    Other Reaction(s): hives   Sulfonamide Derivatives     Other Reaction(s): hives   Oxycodone Itching and Rash     Outpatient Medications Prior to Visit  Medication Sig Dispense Refill   acetaminophen (TYLENOL) 650 MG CR tablet Take 650 mg by mouth 2 (two) times daily.     atorvastatin (LIPITOR) 80 MG tablet Take 80 mg by mouth at bedtime.     fluticasone furoate-vilanterol (BREO ELLIPTA) 100-25 MCG/ACT AEPB Inhale 1 puff into the lungs daily. 60 each 5   lithium carbonate 150 MG capsule Take 450 mg by mouth 2 (two) times daily with a meal.     metFORMIN (GLUCOPHAGE) 500 MG tablet Take 500 mg by mouth daily.      ONE TOUCH ULTRA TEST test strip      warfarin (COUMADIN) 5 MG tablet Take 5 mg by mouth daily.     apixaban (ELIQUIS) 2.5 MG TABS tablet Take 2.5 mg by mouth 2 (two) times daily. (Patient not taking: Reported on 10/05/2023)     No facility-administered medications prior to visit.   Review of Systems  Constitutional:  Negative for chills, fever, malaise/fatigue and weight loss.  HENT:  Negative for congestion, sinus pain and sore throat.   Eyes: Negative.   Respiratory:  Positive for cough, sputum production and shortness of breath. Negative for hemoptysis and wheezing.   Cardiovascular:  Negative for chest pain, palpitations, orthopnea, claudication and leg swelling.  Gastrointestinal:  Negative for abdominal pain, heartburn, nausea and vomiting.  Genitourinary: Negative.   Musculoskeletal:  Negative for joint pain and myalgias.  Skin:  Negative for rash.  Neurological:  Negative for weakness.  Endo/Heme/Allergies: Negative.   Psychiatric/Behavioral: Negative.      Objective:   Vitals:   10/05/23 1407  BP: (!) 112/58  Pulse: 72  Temp: 98.3 F (36.8  C)  TempSrc: Oral  SpO2: 96%  Weight: 212 lb 6.4 oz (96.3 kg)  Height: 5\' 11"  (1.803 m)    Physical Exam Constitutional:      General: He is not in acute distress. HENT:     Head: Normocephalic and atraumatic.  Eyes:     Extraocular Movements: Extraocular movements intact.     Conjunctiva/sclera: Conjunctivae normal.     Pupils: Pupils are equal, round, and reactive to light.  Cardiovascular:  Rate and Rhythm: Normal rate and regular rhythm.     Pulses: Normal pulses.     Heart sounds: Normal heart sounds. No murmur heard. Pulmonary:     Breath sounds: Rales (mild, left base) present.  Abdominal:     General: Bowel sounds are normal.     Palpations: Abdomen is soft.  Musculoskeletal:     Right lower leg: No edema.     Left lower leg: No edema.  Lymphadenopathy:     Cervical: No cervical adenopathy.  Skin:    General: Skin is warm and dry.  Neurological:     General: No focal deficit present.     Mental Status: He is alert.  Psychiatric:        Mood and Affect: Mood normal.        Behavior: Behavior normal.        Thought Content: Thought content normal.        Judgment: Judgment normal.    CBC    Component Value Date/Time   WBC 8.1 10/21/2022 1252   RBC 4.13 (L) 10/21/2022 1252   HGB 12.4 (L) 10/21/2022 1252   HCT 40.4 10/21/2022 1252   PLT 172 10/21/2022 1252   MCV 97.8 10/21/2022 1252   MCH 30.0 10/21/2022 1252   MCHC 30.7 10/21/2022 1252   RDW 14.1 10/21/2022 1252   LYMPHSABS 1.4 10/21/2022 1252   MONOABS 0.8 10/21/2022 1252   EOSABS 0.3 10/21/2022 1252   BASOSABS 0.1 10/21/2022 1252      Latest Ref Rng & Units 10/21/2022   12:52 PM 09/01/2022    5:00 AM 08/31/2022   11:00 AM  BMP  Glucose 70 - 99 mg/dL 89  409  811   BUN 8 - 23 mg/dL 20  18  18    Creatinine 0.61 - 1.24 mg/dL 9.14  7.82  9.56   Sodium 135 - 145 mmol/L 137  133  136   Potassium 3.5 - 5.1 mmol/L 4.2  4.5  4.3   Chloride 98 - 111 mmol/L 107  108  110   CO2 22 - 32 mmol/L 26  21   22    Calcium 8.9 - 10.3 mg/dL 9.7  8.9  8.5    Chest imaging: CXR 10/14/21 The heart size and mediastinal contours are within normal limits. Both lungs are clear. The visualized skeletal structures are unremarkable.  PFT:    Latest Ref Rng & Units 07/07/2023    2:48 PM  PFT Results  FVC-Pre L 3.65   FVC-Predicted Pre % 83   Pre FEV1/FVC % % 55   FEV1-Pre L 2.01   FEV1-Predicted Pre % 63   DLCO uncorrected ml/min/mmHg 20.11   DLCO UNC% % 78   DLCO corrected ml/min/mmHg 20.11   DLCO COR %Predicted % 78   DLVA Predicted % 88   TLC L 6.57   TLC % Predicted % 90   RV % Predicted % 109     Labs: 10/29/21 Cr 1.4 Hgb 12 Sars-CoV2 positive  Path:  Echo:  Heart Catheterization:  Assessment & Plan:   No diagnosis found.  Discussion: Azari Hasler is a 77 year old male, former smoker with hypertension, DMII, DVT/PE, and Factor V Leiden deficiency who returns to pulmonary clinic for COPD follow up.   He has centrilobular emphysema as he reports from CT Chest scan on 12/23/21 at the [place]. We will request a copy of the report.   He is to resume breo ellipta 1 puff daily. He is  to continue zyrtec for possible allergies. He is to continue fluticasone nasal spray for post nasal drainage.   Follow up in 6 months with pulmonary function tests.  Melody Comas, MD [facility] Office: [phone]    Current Outpatient Medications:    acetaminophen (TYLENOL) 650 MG CR tablet, Take 650 mg by mouth 2 (two) times daily., Disp: , Rfl:    atorvastatin (LIPITOR) 80 MG tablet, Take 80 mg by mouth at bedtime., Disp: , Rfl:    fluticasone furoate-vilanterol (BREO ELLIPTA) 100-25 MCG/ACT AEPB, Inhale 1 puff into the lungs daily., Disp: 60 each, Rfl: 5   lithium carbonate 150 MG capsule, Take 450 mg by mouth 2 (two) times daily with a meal., Disp: , Rfl:    metFORMIN (GLUCOPHAGE) 500 MG tablet, Take 500 mg by mouth daily. , Disp: , Rfl:    ONE TOUCH ULTRA TEST  test strip, , Disp: , Rfl:    warfarin (COUMADIN) 5 MG tablet, Take 5 mg by mouth daily., Disp: , Rfl:    apixaban (ELIQUIS) 2.5 MG TABS tablet, Take 2.5 mg by mouth 2 (two) times daily. (Patient not taking: Reported on 10/05/2023), Disp: , Rfl:

## 2023-10-05 NOTE — Patient Instructions (Signed)
Try trelegy Ellipta 1 puff daily  - rinse mouth out after each use  Stop breo while using trelegy  We will check a sleep study if all of your heart evaluation is normal, please message [place] after your heart doctor visit if we need to schedule you for this test.  Follow up in 4 months

## 2023-10-06 ENCOUNTER — Ambulatory Visit: Payer: Medicare Other | Attending: Cardiology | Admitting: Cardiology

## 2023-10-06 ENCOUNTER — Encounter: Payer: Self-pay | Admitting: Cardiology

## 2023-10-06 VITALS — BP 118/72 | HR 66 | Resp 16 | Ht 71.0 in | Wt 212.4 lb

## 2023-10-06 DIAGNOSIS — I2584 Coronary atherosclerosis due to calcified coronary lesion: Secondary | ICD-10-CM | POA: Diagnosis not present

## 2023-10-06 DIAGNOSIS — I4510 Unspecified right bundle-branch block: Secondary | ICD-10-CM | POA: Diagnosis not present

## 2023-10-06 DIAGNOSIS — R0602 Shortness of breath: Secondary | ICD-10-CM | POA: Diagnosis not present

## 2023-10-06 DIAGNOSIS — R0609 Other forms of dyspnea: Secondary | ICD-10-CM | POA: Diagnosis not present

## 2023-10-06 DIAGNOSIS — D6851 Activated protein C resistance: Secondary | ICD-10-CM

## 2023-10-06 DIAGNOSIS — E1140 Type 2 diabetes mellitus with diabetic neuropathy, unspecified: Secondary | ICD-10-CM | POA: Diagnosis not present

## 2023-10-06 DIAGNOSIS — R55 Syncope and collapse: Secondary | ICD-10-CM

## 2023-10-06 DIAGNOSIS — E119 Type 2 diabetes mellitus without complications: Secondary | ICD-10-CM

## 2023-10-06 DIAGNOSIS — Z86711 Personal history of pulmonary embolism: Secondary | ICD-10-CM

## 2023-10-06 DIAGNOSIS — I2510 Atherosclerotic heart disease of native coronary artery without angina pectoris: Secondary | ICD-10-CM | POA: Diagnosis not present

## 2023-10-06 DIAGNOSIS — Z86718 Personal history of other venous thrombosis and embolism: Secondary | ICD-10-CM

## 2023-10-06 DIAGNOSIS — I951 Orthostatic hypotension: Secondary | ICD-10-CM

## 2023-10-06 NOTE — Patient Instructions (Signed)
Medication Instructions:  Your physician recommends that you continue on your current medications as directed. Please refer to the Current Medication list given to you today.  *If you need a refill on your cardiac medications before your next appointment, please call your pharmacy*  Lab Work: None ordered today. If you have labs (blood work) drawn today and your tests are completely normal, you will receive your results only by: MyChart Message (if you have MyChart) OR A paper copy in the mail If you have any lab test that is abnormal or we need to change your treatment, we will call you to review the results.  Testing/Procedures: None ordered today.  Follow-Up: At [facility], you and your health needs are our priority.  As part of our continuing mission to provide you with exceptional heart care, we have created designated Provider Care Teams.  These Care Teams include your primary Cardiologist (physician) and Advanced Practice Providers (APPs -  Physician Assistants and Nurse Practitioners) who all work together to provide you with the care you need, when you need it.  Your next appointment:   6 month(s)  The format for your next appointment:   In Person  Provider:   Tessa Lerner, DO {

## 2023-10-06 NOTE — Progress Notes (Signed)
Cardiology Office Note:  .   Date:  10/06/2023  ID:  [name] Hall, [name] Hall July 30, 1946, MRN [id number] PCP:  Daisy Floro, MD  Former Cardiology Providers: Dr. Donato Schultz Cardiology Providers at [facility]: Dr. Pete Pelt [facility] Providers Cardiologist:  Tessa Lerner, DO , [facility] (established care 06/18/2023) Electrophysiologist:  None  Click to update primary MD,subspecialty MD or APP then REFRESH:1}    Chief Complaint  Patient presents with   Shortness of Breath        Follow-up    History of Present Illness: [name] Hall is a 77 y.o. Caucasian male whose past medical history and cardiovascular risk factors includes: History of orthostatic hypotension, hyperlipidemia, NIDDM Type II, history of DVT/PE, former smoker, factor V Leiden mutation, history of alcoholism/depression, history of TIA/stroke (right frontal lobe infarct provided by PCP records), AAA, status post EVAR with left iliac branch device (Dr. Lenell Antu 08/2022), history of intracranial hemorrhage (08/2014 records provided by PCP).  Patient was referred to the practice for evaluation of dyspnea on exertion and near syncope.  Since establishing care patient has undergone appropriate ischemic workup as outlined below along with Zio patch to evaluate for dysrhythmias.  At the last office visit he was recommended to undergo cardiopulmonary stress test which is still pending.  I also recommended left heart catheterization with possible intervention to rule out balanced ischemia given his symptoms of shortness of breath with very minimal activity.  However patient refused and stated that he may have it at the [facility] to minimize his out-of-pocket expense.  He presents today for follow-up.  Since last office visit patient still continues to have shortness of breath with effort related activities.  No significant improvement he did follow-up with his cardiologist at the [facility] and plans to have a heart catheterization  in the near future.  I had ordered a cardiopulmonary stress test at the last office visit to further define the etiology of his dyspnea but he has not had it done yet.  Review of Systems: .   Review of Systems  Cardiovascular:  Positive for dyspnea on exertion. Negative for chest pain, claudication, irregular heartbeat, leg swelling, orthopnea, palpitations, paroxysmal nocturnal dyspnea and syncope.  Respiratory:  Positive for shortness of breath.   Hematologic/Lymphatic: Negative for bleeding problem.  Musculoskeletal:  Negative for muscle cramps and myalgias.    Studies Reviewed:   EKG: EKG Interpretation Date/Time:  Wednesday October 06 2023 10:03:03 EST Ventricular Rate:  66 PR Interval:  314 QRS Duration:  154 QT Interval:  446 QTC Calculation: 467 R Axis:   45  Text Interpretation: Sinus rhythm with 1st degree A-V block with occasional Premature ventricular complexes Right bundle branch block T wave abnormality, consider lateral ischemia When compared with ECG of 27-Aug-2022 14:01, Premature ventricular complexes are now Present Confirmed by Tessa Lerner [phone]) on 10/06/2023 10:12:11 AM  Echocardiogram: 05/27/2023:  Normal LV systolic function with visual EF 60-65%. Left ventricle cavity  is normal in size. Normal left ventricular wall thickness. Normal global  wall motion. Normal diastolic filling pattern, normal LAP.  Aortic valve sclerosis without stenosis.  No significant valvular heart disease.  Compared to 04/14/2012 no significant change.   Stress Testing: Lexiscan Tetrofosmin (Walking) stress test 05/31/2023: 1 Day Rest/Stress protocol.  Exercise time 4 minutes 00 seconds on Bruce protocol, achieved 2.30 METS, 69 % of age-predicted maximum heart rate (APMHR).  Stress ECG nondiagnostic for ischemia-Target heart rate not achieved. Normal myocardial perfusion without convincing  evidence of reversible ischemia or prior infarct. Calculated LVEF 65%, wall thickness  preserved, no regional wall motion abnormalities. Low risk study.  Carotid duplex: Apr 29, 2016 Less than 50% stenosis in the right and left internal carotid arteries.  Cardiac monitor (Zio Patch): May 27, 2023-June 10, 2023 Dominant rhythm sinus rhythm with first-degree AV block.   Rare episodes of Wenckebach physiology present. Heart rate 31-121 bpm.  Avg HR 70 bpm. No atrial fibrillation, supraventricular tachycardia, ventricular tachycardia, high grade AV block, pauses (3 seconds or longer). Total ventricular ectopic burden <1%. Total supraventricular ectopic burden <1%. Patient triggered events: 0.  RADIOLOGY: CT abdomen pelvis with and without contrast: September 28, 2022 IMPRESSION: VASCULAR 1. Status post aortobi-iliac and branched left iliac endograft repair of infrarenal abdominal aortic and left iliac artery aneurysms. No complicating features, specifically no evidence of endoleak or aneurysm sac enlargement. 2.  Aortic Atherosclerosis (ICD10-I70.0).   NON-VASCULAR   1. Diverticulosis. 2. Multilevel degenerative changes of the lumbar spine.  Risk Assessment/Calculations:   NA   Labs:       Latest Ref Rng & Units 10/21/2022   12:52 PM 09/01/2022    5:00 AM 08/31/2022   11:00 AM  CBC  WBC 4.0 - 10.5 K/uL 8.1  10.8  5.7   Hemoglobin 13.0 - 17.0 g/dL 29.5  62.1  30.8   Hematocrit 39.0 - 52.0 % 40.4  34.2  37.0   Platelets 150 - 400 K/uL 172  107  90        Latest Ref Rng & Units 10/21/2022   12:52 PM 09/01/2022    5:00 AM 08/31/2022   11:00 AM  BMP  Glucose 70 - 99 mg/dL 89  657  846   BUN 8 - 23 mg/dL 20  18  18    Creatinine 0.61 - 1.24 mg/dL 9.62  9.52  8.41   Sodium 135 - 145 mmol/L 137  133  136   Potassium 3.5 - 5.1 mmol/L 4.2  4.5  4.3   Chloride 98 - 111 mmol/L 107  108  110   CO2 22 - 32 mmol/L 26  21  22    Calcium 8.9 - 10.3 mg/dL 9.7  8.9  8.5       Latest Ref Rng & Units 10/21/2022   12:52 PM 09/01/2022    5:00 AM 08/31/2022   11:00 AM   CMP  Glucose 70 - 99 mg/dL 89  324  401   BUN 8 - 23 mg/dL 20  18  18    Creatinine 0.61 - 1.24 mg/dL 0.27  2.53  6.64   Sodium 135 - 145 mmol/L 137  133  136   Potassium 3.5 - 5.1 mmol/L 4.2  4.5  4.3   Chloride 98 - 111 mmol/L 107  108  110   CO2 22 - 32 mmol/L 26  21  22    Calcium 8.9 - 10.3 mg/dL 9.7  8.9  8.5   Total Protein 6.5 - 8.1 g/dL 7.5     Total Bilirubin 0.3 - 1.2 mg/dL 0.7     Alkaline Phos 38 - 126 U/L 111     AST 15 - 41 U/L 18     ALT 0 - 44 U/L 14       Lab Results  Component Value Date   CHOL  12/23/2007    166        ATP III CLASSIFICATION:  <200     mg/dL   Desirable  403-474  mg/dL   Borderline High  >=161    mg/dL   High   HDL 61 09/60/4540   LDLCALC  12/23/2007    91        Total Cholesterol/HDL:CHD Risk Coronary Heart Disease Risk Table                     Men   Women  1/2 Average Risk   3.4   3.3   TRIG 70 12/23/2007   CHOLHDL 2.7 12/23/2007   No results for input(s): "LIPOA" in the last 8760 hours. No components found for: "NTPROBNP" No results for input(s): "PROBNP" in the last 8760 hours. No results for input(s): "TSH" in the last 8760 hours.   Physical Exam:    Today's Vitals   10/06/23 0959  BP: 118/72  Pulse: 66  Resp: 16  SpO2: 96%  Weight: 212 lb 6.4 oz (96.3 kg)  Height: 5\' 11"  (1.803 m)   Body mass index is 29.62 kg/m. Wt Readings from Last 3 Encounters:  10/06/23 212 lb 6.4 oz (96.3 kg)  10/05/23 212 lb 6.4 oz (96.3 kg)  06/18/23 208 lb 3.2 oz (94.4 kg)    Physical Exam  Constitutional: No distress.  Age appropriate, hemodynamically stable.   Neck: No JVD present.  Cardiovascular: Normal rate, regular rhythm, S1 normal, S2 normal, intact distal pulses and normal pulses. Exam reveals no gallop, no S3 and no S4.  No murmur heard. Pulmonary/Chest: Effort normal and breath sounds normal. No stridor. He has no wheezes. He has no rales.  Abdominal: Soft. Bowel sounds are normal. He exhibits no distension. There is no  abdominal tenderness.  Musculoskeletal:        General: No edema.     Cervical back: Neck supple.     Comments: Bilateral compression stockings  Neurological: He is alert and oriented to person, place, and time. He has intact cranial nerves (2-12).  Skin: Skin is warm and moist.     Impression & Recommendation(s):  Impression:   ICD-10-CM   1. Dyspnea on exertion  R06.09 EKG 12-Lead    2. Coronary atherosclerosis due to calcified coronary lesion  I25.10    I25.84     3. RBBB  I45.10     4. Near syncope  R55     5. Orthostatic hypotension  I95.1     6. Hx of deep venous thrombosis  Z86.718     7. Hx of pulmonary embolus  Z86.711     8. Factor V Leiden mutation (HCC)  D68.51     9. Non-insulin dependent type 2 diabetes mellitus (HCC)  E11.9        Recommendation(s):  Dyspnea on exertion Coronary atherosclerosis due to calcified coronary lesion RBBB Denies anginal chest pain. Continues to have shortness of breath with minimal activity. Has undergone appropriate ischemic workup including echocardiogram, MPI, and a Zio patch.  Results were reviewed again at today's office visit. During his prior office visit he was recommended to undergo left heart catheterization due to his continued dyspnea and a normal myocardial perfusion cannot entirely rule out balanced ischemia.  However he chooses to proceed forward with heart catheterization at the [facility] to minimize his out-of-pocket cost. Since last office visit he did reestablish care with his cardiologist at the [facility] and plans to undergo heart catheterization in the near future.  I have advised him to send me a copy of his results. Hold off on the cardiopulmonary stress test at this time  as it is not performed as of yet. If he is noted to have CAD and correcting/addressing the disease burden improves his dyspnea no need for cardiopulmonary stress test.  However if he is noted to have nonobstructive disease and continues to have  dyspnea proceeding forward with cardiopulmonary stress test may still be beneficial. He does follow-up with pulmonary medicine as well-defer noncardiac causes of his dyspnea to their expertise.  Near syncope Orthostatic hypotension Office blood pressures are well-controlled. Symptoms of near syncope and orthostasis have improved after changes in his BPH medications. Reemphasized importance of increasing physical activity, keeping himself well-hydrated.  Hx of deep venous thrombosis Hx of pulmonary embolus Factor V Leiden mutation (HCC) States that he is transitioning from Coumadin back to Eliquis.  Non-insulin dependent type 2 diabetes mellitus (HCC) Hemoglobin A1c is well-controlled as of May 2024. Continue statin therapy.   Currently not on ACE inhibitors or ARB due to his history of orthostasis. Reemphasized the importance of glycemic control.  Orders Placed:  Orders Placed This Encounter  Procedures   EKG 12-Lead    Final Medication List:   No orders of the defined types were placed in this encounter.   There are no discontinued medications.   Current Outpatient Medications:    acetaminophen (TYLENOL) 650 MG CR tablet, Take 650 mg by mouth 2 (two) times daily., Disp: , Rfl:    atorvastatin (LIPITOR) 80 MG tablet, Take 80 mg by mouth at bedtime., Disp: , Rfl:    fluticasone furoate-vilanterol (BREO ELLIPTA) 100-25 MCG/ACT AEPB, Inhale 1 puff into the lungs daily., Disp: 60 each, Rfl: 5   Fluticasone-Umeclidin-Vilant (TRELEGY ELLIPTA) 100-62.5-25 MCG/ACT AEPB, Inhale 1 puff into the lungs daily., Disp: , Rfl:    lithium carbonate 150 MG capsule, Take 450 mg by mouth 2 (two) times daily with a meal., Disp: , Rfl:    metFORMIN (GLUCOPHAGE) 500 MG tablet, Take 500 mg by mouth daily. , Disp: , Rfl:    ONE TOUCH ULTRA TEST test strip, , Disp: , Rfl:    warfarin (COUMADIN) 5 MG tablet, Take 5 mg by mouth daily., Disp: , Rfl:   Consent:   NA  Disposition:   6 months sooner if  needed. Patient may be asked to follow-up sooner based on the results of the above-mentioned testing.  His questions and concerns were addressed to his satisfaction. He voices understanding of the recommendations provided during this encounter.    Signed, Tessa Lerner, DO, [facility]  [address] [place], [place] [address] 10/06/2023 10:40 AM

## 2023-10-07 ENCOUNTER — Encounter: Payer: Self-pay | Admitting: Pulmonary Disease

## 2023-10-15 ENCOUNTER — Ambulatory Visit (HOSPITAL_COMMUNITY)
Admission: RE | Admit: 2023-10-15 | Discharge: 2023-10-15 | Disposition: A | Discharge status: Home / Self Care | Payer: No Typology Code available for payment source | Source: Ambulatory Visit | Attending: Vascular Surgery | Admitting: Vascular Surgery

## 2023-10-15 DIAGNOSIS — Z9889 Other specified postprocedural states: Secondary | ICD-10-CM | POA: Insufficient documentation

## 2023-10-15 DIAGNOSIS — Z8679 Personal history of other diseases of the circulatory system: Secondary | ICD-10-CM | POA: Diagnosis present

## 2023-10-25 NOTE — Progress Notes (Unsigned)
[facility] [place]  ASSESSMENT / PLAN: [name] Hall is a 77 y.o. 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   Recommend the following to reduce the risk of major adverse cardiac / limb events.  Complete cessation from all tobacco products. Blood glucose control with goal A1c < 7%. Blood pressure control with goal blood pressure < 140/90 mmHg. Lipid reduction therapy with goal LDL-C <100 mg/dL. Aspirin 81mg  PO QD.  Atorvastatin 40-80mg  PO QD (or other "high intensity" statin therapy).  No issues identified on duplex today.  Follow up annually with duplex of AAA.   CHIEF COMPLAINT: Aneurysm  HISTORY OF PRESENT ILLNESS: [name] Hall is a 77 y.o. male referred to clinic for evaluation of enlarging abdominal aortic aneurysm and common iliac artery aneurysm.  The patient has been followed by the [facility] in [place] for the same.  He is asymptomatic from a aneurysm standpoint.  He reports history of smoking, but no family history of aneurysm disease, or connective tissue disease.  The bulk of our visit was spent reviewing the rationale for intervention, and the different options available for intervention.  09/29/22: Patient returns after EVAR with iliac branch device.  He tolerated this very well.  He does report some left groin pain, which is being worked up for possible hernia.  10/26/23: Patient returns for surveillance.  He is doing well overall.  We reviewed his duplex in detail.  VASCULAR SURGICAL HISTORY: None  VASCULAR RISK FACTORS: Positive history of stroke / transient ischemic attack. Negative history of coronary artery disease.  Negative history of diabetes mellitus. Positive history of smoking. Not actively smoking. Positive history of hypertension.  Negative history of chronic kidney disease.   Negative history of chronic obstructive pulmonary  disease.  FUNCTIONAL STATUS: ECOG performance status: (0) Fully active, able to carry on all predisease performance without restriction Ambulatory status: Ambulatory within the community without limits  [name] (2pts) 75-79 or 80-84 (2pts) >84 (3pts) Dependence in toileting (1pt) Partial or full dependence in dressing (1pt) History of malignant neoplasm (2pts) CHF (3pts) COPD (1pts) CKD (3pts)  0-3 pts 6% 1 year mortality ; 21% 3 year mortality 4-5 pts 12% 1 year mortality ; 36% 3 year mortality >5 pts 21% 1 year mortality; 54% 3 year mortality   Past Medical History:  Diagnosis Date   Bipolar disorder (HCC)    Diabetes mellitus    type 2   DVT (deep venous thrombosis) (HCC) 2006   leg after joint replacement    Dyspnea    with exertion occ   ED (erectile dysfunction)    Factor 5 Leiden mutation, heterozygous (HCC)    Hepatitis    41 yrs ago hepatitis a   Hyperlipidemia    Orthostatic hypotension    Osteoarthritis    PE (pulmonary embolism) 01/27/2017   after right tha revision   PERSONAL HISTORY, VENOUS THROMBOSIS AND EMBOLISM    Prostate CA (HCC) 2012   treated with radiation   PROSTATE CANCER    Stroke (HCC) 15-20 yrs ago, saw on mri   TIA no residual from    Past Surgical History:  Procedure Laterality Date   ABDOMINAL AORTIC ENDOVASCULAR STENT GRAFT Bilateral 08/31/2022   Procedure: ABDOMINAL AORTIC ENDOVASCULAR STENT GRAFT;  Surgeon: Leonie Douglas, MD;  Location: [facility] OR;  Service: Vascular;  Laterality: Bilateral;   CATARACT  EXTRACTION Bilateral    colonoscopy     polyp located and tested negative   COLONOSCOPY WITH PROPOFOL  10/2003, [id number]   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   Procedure: PENILE PROTHESIS INFLATABLE;  Surgeon: Marcine Matar, MD;  Location: [facility];  Service: Urology;  Laterality: N/A;   TONSILLECTOMY AND ADENOIDECTOMY     AS A CHILD   TOTAL HIP ARTHROPLASTY     Left and right  hip replacements   TOTAL HIP REVISION Right 01/27/2017   Procedure: RIGHT TOTAL HIP REVISION;  Surgeon: Ollen Gross, MD;  Location: [facility] ORS;  Service: Orthopedics;  Laterality: Right;    Family History  Problem Relation Age of Onset   Hyperlipidemia Mother    Hypertension Mother    Heart attack Father    Hyperlipidemia Father    Hypertension Father    Sudden death Father    Depression Sister     Social History   Socioeconomic History   Marital status: Married    Spouse name: Not on file   Number of children: Not on file   Years of education: Not on file   Highest education level: Not on file  Occupational History   Not on file  Tobacco Use   Smoking status: Former    Current packs/day: 0.00    Average packs/day: 1 pack/day for 30.0 years (30.0 ttl pk-yrs)    Types: Cigarettes    Start date: 02/23/1984    Quit date: 02/22/2014    Years since quitting: 9.6   Smokeless tobacco: Never  Vaping Use   Vaping status: Never Used  Substance and Sexual Activity   Alcohol use: No   Drug use: No   Sexual activity: Never  Other Topics Concern   Not on file  Social History Narrative   Not on file   Social Determinants of Health   Financial Resource Strain: Not on file  Food Insecurity: No Food Insecurity (02/17/2021)   Received from [facility] visits prior to 01/30/2023., [facility] visits prior to 01/30/2023.   Hunger Vital Sign    Worried About Programme researcher, broadcasting/film/video in the Last Year: Never true    Ran Out of Food in the Last Year: Never true  Transportation Needs: Not on file  Physical Activity: Not on file  Stress: Not on file  Social Connections: Not on file  Intimate Partner Violence: Not on file    Allergies  Allergen Reactions   Degarelix     Other Reaction(s): bowel obstruction   Duloxetine Swelling    Other Reaction(s): Hypotension   Pregabalin     Other reaction(s): Dizziness (intolerance), Low blood pressure, Other  (See Comments), UNSTEADY GAIT  Other Reaction(s): Dizziness, Other (See Comments)   Ramipril Hives    Other Reaction(s): hives   Sulfonamide Derivatives     Other Reaction(s): hives   Oxycodone Itching and Rash    Current Outpatient Medications  Medication Sig Dispense Refill   acetaminophen (TYLENOL) 650 MG CR tablet Take 650 mg by mouth 2 (two) times daily.     atorvastatin (LIPITOR) 80 MG tablet Take 80 mg by mouth at bedtime.     fluticasone furoate-vilanterol (BREO ELLIPTA) 100-25 MCG/ACT AEPB Inhale 1 puff into the lungs daily. 60 each 5   Fluticasone-Umeclidin-Vilant (TRELEGY ELLIPTA) 100-62.5-25 MCG/ACT AEPB Inhale 1 puff into the lungs daily.     lithium carbonate 150 MG capsule Take 450 mg by mouth 2 (two) times daily  with a meal.     metFORMIN (GLUCOPHAGE) 500 MG tablet Take 500 mg by mouth daily.      ONE TOUCH ULTRA TEST test strip      warfarin (COUMADIN) 5 MG tablet Take 5 mg by mouth daily.     No current facility-administered medications for this visit.    PHYSICAL EXAM There were no vitals filed for this visit.   Well-appearing gentleman in no acute distress Regular rate and rhythm Unlabored breathing Nondistended abdomen  PERTINENT LABORATORY AND RADIOLOGIC DATA  Most recent CBC    Latest Ref Rng & Units 10/21/2022   12:52 PM 09/01/2022    5:00 AM 08/31/2022   11:00 AM  CBC  WBC 4.0 - 10.5 K/uL 8.1  10.8  5.7   Hemoglobin 13.0 - 17.0 g/dL 32.3  55.7  32.2   Hematocrit 39.0 - 52.0 % 40.4  34.2  37.0   Platelets 150 - 400 K/uL 172  107  90      Most recent CMP    Latest Ref Rng & Units 10/21/2022   12:52 PM 09/01/2022    5:00 AM 08/31/2022   11:00 AM  CMP  Glucose 70 - 99 mg/dL 89  025  427   BUN 8 - 23 mg/dL 20  18  18    Creatinine 0.61 - 1.24 mg/dL 0.62  3.76  2.83   Sodium 135 - 145 mmol/L 137  133  136   Potassium 3.5 - 5.1 mmol/L 4.2  4.5  4.3   Chloride 98 - 111 mmol/L 107  108  110   CO2 22 - 32 mmol/L 26  21  22    Calcium 8.9 - 10.3  mg/dL 9.7  8.9  8.5   Total Protein 6.5 - 8.1 g/dL 7.5     Total Bilirubin 0.3 - 1.2 mg/dL 0.7     Alkaline Phos 38 - 126 U/L 111     AST 15 - 41 U/L 18     ALT 0 - 44 U/L 14       Renal function CrCl cannot be calculated (Patient's most recent lab result is older than the maximum 21 days allowed.).  Hgb A1c MFr Bld (%)  Date Value  01/20/2017 5.6    LDL Cholesterol  Date Value Ref Range Status  12/23/2007   Final   91        Total Cholesterol/HDL:CHD Risk Coronary Heart Disease Risk Table                     Men   Women  1/2 Average Risk   3.4   3.3    Duplex personally reviewed.  No evidence of interval sac growth.  Sac measures 45 mm.  Iliac artery is not well studied.   Rande Brunt. Lenell Antu, MD [facility] Phone Number: [phone] 10/25/2023 12:16 PM  Total time spent on preparing this encounter including chart review, data review, collecting history, examining the patient, coordinating care for this patient, 20 minutes.  Portions of this report may have been transcribed using voice recognition software.  Every effort has been made to ensure accuracy; however, inadvertent computerized transcription errors may still be present.

## 2023-10-26 ENCOUNTER — Ambulatory Visit (INDEPENDENT_AMBULATORY_CARE_PROVIDER_SITE_OTHER): Payer: No Typology Code available for payment source | Admitting: Vascular Surgery

## 2023-10-26 ENCOUNTER — Encounter: Payer: Self-pay | Admitting: Vascular Surgery

## 2023-10-26 VITALS — BP 103/71 | HR 52 | Temp 97.8°F | Resp 20 | Ht 71.0 in | Wt 214.0 lb

## 2023-10-26 DIAGNOSIS — Z9889 Other specified postprocedural states: Secondary | ICD-10-CM

## 2023-10-26 DIAGNOSIS — Z8679 Personal history of other diseases of the circulatory system: Secondary | ICD-10-CM

## 2023-11-02 ENCOUNTER — Other Ambulatory Visit: Payer: Self-pay | Admitting: Pulmonary Disease

## 2023-11-02 ENCOUNTER — Other Ambulatory Visit: Payer: Self-pay

## 2023-11-02 DIAGNOSIS — I7143 Infrarenal abdominal aortic aneurysm, without rupture: Secondary | ICD-10-CM

## 2023-11-03 MED ORDER — TRELEGY ELLIPTA 100-62.5-25 MCG/ACT IN AEPB: 1.0000 | INHALATION_SPRAY | Freq: Every day | RESPIRATORY_TRACT | 3 refills | Status: DC

## 2024-01-24 DIAGNOSIS — L821 Other seborrheic keratosis: Secondary | ICD-10-CM | POA: Diagnosis not present

## 2024-01-24 DIAGNOSIS — L570 Actinic keratosis: Secondary | ICD-10-CM | POA: Diagnosis not present

## 2024-01-24 DIAGNOSIS — L820 Inflamed seborrheic keratosis: Secondary | ICD-10-CM | POA: Diagnosis not present

## 2024-02-04 ENCOUNTER — Ambulatory Visit: Payer: No Typology Code available for payment source | Admitting: Pulmonary Disease

## 2024-02-04 ENCOUNTER — Encounter: Payer: Self-pay | Admitting: Pulmonary Disease

## 2024-02-04 VITALS — BP 108/69 | HR 68 | Ht 71.0 in | Wt 213.0 lb

## 2024-02-04 DIAGNOSIS — J449 Chronic obstructive pulmonary disease, unspecified: Secondary | ICD-10-CM | POA: Diagnosis not present

## 2024-02-04 DIAGNOSIS — J432 Centrilobular emphysema: Secondary | ICD-10-CM | POA: Diagnosis not present

## 2024-02-04 MED ORDER — FLUTICASONE-SALMETEROL 250-50 MCG/ACT IN AEPB: 1.0000 | INHALATION_SPRAY | Freq: Two times a day (BID) | RESPIRATORY_TRACT | 11 refills | Status: AC

## 2024-02-04 MED ORDER — ALBUTEROL SULFATE HFA 108 (90 BASE) MCG/ACT IN AERS: 2.0000 | INHALATION_SPRAY | Freq: Four times a day (QID) | RESPIRATORY_TRACT | 6 refills | Status: AC | PRN

## 2024-02-04 MED ORDER — SPIRIVA RESPIMAT 2.5 MCG/ACT IN AERS: 2.0000 | INHALATION_SPRAY | Freq: Every day | RESPIRATORY_TRACT | 11 refills | Status: AC

## 2024-02-04 NOTE — Patient Instructions (Addendum)
 Start wixella inhaler 1 puff twice daily - rinse mouth out after each use  Start spiriva inhaler 2 puffs daily  Use albuterol inhaler 1-2 puffs every 4-6 hours as needed  Follow up in 6 months

## 2024-02-04 NOTE — Progress Notes (Signed)
 Synopsis: Referred in December 2022 for COPD by Daisy Floro, MD  Subjective:   PATIENT ID: [name] Hall: male DOB: Apr 12, 1946, MRN: [id number]  HPI  Chief Complaint  Patient presents with   Follow-up    Pt states trelegy has went up in price would like a Rx for the [place]    [name] Hall is a 78 year old male, former smoker with hypertension, DMII, DVT/PE, and Factor V Leiden deficiency who returns to pulmonary clinic for COPD follow up.   He has been using Trelegy inhaler, which was effective, but due to a significant price increase from $40 to over $300, he is unable to afford it. He typically obtains his medications through the [facility] pharmacy, which does not cover Trelegy or Breztri inhalers due to formulary restrictions.  He has a history of using Breo and Trelegy inhalers but has not used Spiriva before. Currently, he does not have any inhalers as he was planning to renew his prescription but was informed of the price increase. He does not have an albuterol inhaler at home and has not been using any as-needed inhalers.  He is classified as 100% disabled and receives most of his medications through the [place], which are typically free of charge. He mentions that he tends to pick up his medications from the [facility], although sometimes he is mailed to him.  OV 10/05/2023 He was seen 05/14/23 by Dr. Judeth Horn for COPD exacerbation.  He continues to have dyspnea. The shortness of breath is significant enough to limit the patient's mobility, causing discomfort when walking without support. The patient reports no similar episodes in the past. The patient also reports increased urinary frequency at night, waking up two to three times. The patient has a history of taking Flomax (tamsulosin) for urinary issues, but stopped due to the onset of shortness of breath. Around the same time, the patient switched from Eliquis to warfarin. The patient has been seen at the [facility] for  these issues and has also been under the care of a cardiologist, Dr. Odis Hollingshead.  OV 12/30/21 He was started on breo ellipta 100-21mcg 1 puff daily for concern of obstructive lung disease which he took for 3 weeks and the cough subsided. He is not taking the breo now. He is taking zyrtec plus fluticasone for post-nasal drainage. He is currently having very little sinus congestion and drainage now. He is mainly coughing in the morning time, clear phlegm. Experiencing dyspnea on exertion.   He was given an inhaler by his PCP but says this was too harsh.   OV 10/30/21 He reports having cough for 6-8 weeks. He recently tested positive for covid 11/15 and again 11/30. He was started on augmentin by his PCP 11/30 and benzonatate. He started to use Zyrtec daily over the last month. He does not know if he has seasonal allergies.   The cough is intermittently productive. He has some sinus congestion and intermittent drainage. He does have some dyspnea with the cough. He denies heartburn.   He is a former smoker, undergoing lung cancer screening at the [place]. Has scan on 12/9 coming up. He was in the roofing business. Has agent orange exposure.   Past Medical History:  Diagnosis Date   Bipolar disorder (HCC)    Diabetes mellitus    type 2   DVT (deep venous thrombosis) (HCC) 2006   leg after joint replacement    Dyspnea    with exertion occ   ED (  erectile dysfunction)    Factor 5 Leiden mutation, heterozygous (HCC)    Hepatitis    41 yrs ago hepatitis a   Hyperlipidemia    Orthostatic hypotension    Osteoarthritis    PE (pulmonary embolism) 01/27/2017   after right tha revision   PERSONAL HISTORY, VENOUS THROMBOSIS AND EMBOLISM    Prostate CA (HCC) 2012   treated with radiation   PROSTATE CANCER    Stroke (HCC) 15-20 yrs ago, saw on mri   TIA no residual from     Family History  Problem Relation Age of Onset   Hyperlipidemia Mother    Hypertension Mother    Heart attack Father     Hyperlipidemia Father    Hypertension Father    Sudden death Father    Depression Sister      Social History   Socioeconomic History   Marital status: Married    Spouse name: Not on file   Number of children: Not on file   Years of education: Not on file   Highest education level: Not on file  Occupational History   Not on file  Tobacco Use   Smoking status: Former    Current packs/day: 0.00    Average packs/day: 1 pack/day for 30.0 years (30.0 ttl pk-yrs)    Types: Cigarettes    Start date: 02/23/1984    Quit date: 02/22/2014    Years since quitting: 9.9   Smokeless tobacco: Never  Vaping Use   Vaping status: Never Used  Substance and Sexual Activity   Alcohol use: No   Drug use: No   Sexual activity: Never  Other Topics Concern   Not on file  Social History Narrative   Not on file   Social Drivers of Health   Financial Resource Strain: Not on file  Food Insecurity: No Food Insecurity (02/17/2021)   Received from [facility] visits prior to 01/30/2023., [facility] visits prior to 01/30/2023.   Hunger Vital Sign    Worried About Programme researcher, broadcasting/film/video in the Last Year: Never true    Ran Out of Food in the Last Year: Never true  Transportation Needs: Not on file  Physical Activity: Not on file  Stress: Not on file  Social Connections: Not on file  Intimate Partner Violence: Not on file     Allergies  Allergen Reactions   Degarelix     Other Reaction(s): bowel obstruction   Duloxetine Swelling    Other Reaction(s): Hypotension   Pregabalin     Other reaction(s): Dizziness (intolerance), Low blood pressure, Other (See Comments), UNSTEADY GAIT  Other Reaction(s): Dizziness, Other (See Comments)   Ramipril Hives    Other Reaction(s): hives   Sulfonamide Derivatives     Other Reaction(s): hives   Oxycodone Itching and Rash     Outpatient Medications Prior to Visit  Medication Sig Dispense Refill   acetaminophen  (TYLENOL) 650 MG CR tablet Take 650 mg by mouth 2 (two) times daily.     apixaban (ELIQUIS) 5 MG TABS tablet Take 5 mg by mouth 2 (two) times daily.     atorvastatin (LIPITOR) 80 MG tablet Take 80 mg by mouth at bedtime.     lithium carbonate 150 MG capsule Take 450 mg by mouth 2 (two) times daily with a meal.     metFORMIN (GLUCOPHAGE) 500 MG tablet Take 500 mg by mouth daily.      ONE TOUCH ULTRA TEST test strip  fluticasone furoate-vilanterol (BREO ELLIPTA) 100-25 MCG/ACT AEPB Inhale 1 puff into the lungs daily. 60 each 5   Fluticasone-Umeclidin-Vilant (TRELEGY ELLIPTA) 100-62.5-25 MCG/ACT AEPB Inhale 1 puff into the lungs daily. 60 each 3   No facility-administered medications prior to visit.   Review of Systems  Constitutional:  Negative for chills, fever, malaise/fatigue and weight loss.  HENT:  Negative for congestion, sinus pain and sore throat.   Eyes: Negative.   Respiratory:  Positive for shortness of breath. Negative for cough, hemoptysis, sputum production and wheezing.   Cardiovascular:  Negative for chest pain, palpitations, orthopnea, claudication and leg swelling.  Gastrointestinal:  Negative for abdominal pain, heartburn, nausea and vomiting.  Genitourinary: Negative.   Musculoskeletal:  Negative for joint pain and myalgias.  Skin:  Negative for rash.  Neurological:  Negative for weakness.  Endo/Heme/Allergies: Negative.   Psychiatric/Behavioral: Negative.      Objective:   Vitals:   02/04/24 1041  BP: 108/69  Pulse: 68  SpO2: 95%  Weight: 213 lb (96.6 kg)  Height: 5\' 11"  (1.803 m)    Physical Exam Constitutional:      General: He is not in acute distress. HENT:     Head: Normocephalic and atraumatic.  Eyes:     Conjunctiva/sclera: Conjunctivae normal.  Cardiovascular:     Rate and Rhythm: Normal rate and regular rhythm.     Pulses: Normal pulses.     Heart sounds: Normal heart sounds. No murmur heard. Pulmonary:     Breath sounds: No wheezing,  rhonchi or rales.  Musculoskeletal:     Right lower leg: No edema.     Left lower leg: No edema.  Skin:    General: Skin is warm and dry.  Neurological:     General: No focal deficit present.     Mental Status: He is alert.    CBC    Component Value Date/Time   WBC 8.1 10/21/2022 1252   RBC 4.13 (L) 10/21/2022 1252   HGB 12.4 (L) 10/21/2022 1252   HCT 40.4 10/21/2022 1252   PLT 172 10/21/2022 1252   MCV 97.8 10/21/2022 1252   MCH 30.0 10/21/2022 1252   MCHC 30.7 10/21/2022 1252   RDW 14.1 10/21/2022 1252   LYMPHSABS 1.4 10/21/2022 1252   MONOABS 0.8 10/21/2022 1252   EOSABS 0.3 10/21/2022 1252   BASOSABS 0.1 10/21/2022 1252      Latest Ref Rng & Units 10/21/2022   12:52 PM 09/01/2022    5:00 AM 08/31/2022   11:00 AM  BMP  Glucose 70 - 99 mg/dL 89  161  096   BUN 8 - 23 mg/dL 20  18  18    Creatinine 0.61 - 1.24 mg/dL 0.45  4.09  8.11   Sodium 135 - 145 mmol/L 137  133  136   Potassium 3.5 - 5.1 mmol/L 4.2  4.5  4.3   Chloride 98 - 111 mmol/L 107  108  110   CO2 22 - 32 mmol/L 26  21  22    Calcium 8.9 - 10.3 mg/dL 9.7  8.9  8.5    Chest imaging: CXR 10/14/21 The heart size and mediastinal contours are within normal limits. Both lungs are clear. The visualized skeletal structures are unremarkable.  PFT:    Latest Ref Rng & Units 07/07/2023    2:48 PM  PFT Results  FVC-Pre L 3.65   FVC-Predicted Pre % 83   Pre FEV1/FVC % % 55   FEV1-Pre L 2.01   FEV1-Predicted Pre %  63   DLCO uncorrected ml/min/mmHg 20.11   DLCO UNC% % 78   DLCO corrected ml/min/mmHg 20.11   DLCO COR %Predicted % 78   DLVA Predicted % 88   TLC L 6.57   TLC % Predicted % 90   RV % Predicted % 109     Labs:   DIAGNOSTIC EKG: Right bundle branch block (05/2023) Stress test: Low risk (05/2023) Echocardiography: Normal (05/2023)  Assessment & Plan:   Centrilobular emphysema (HCC) - Plan: fluticasone-salmeterol (WIXELA INHUB) 250-50 MCG/ACT AEPB, Tiotropium Bromide Monohydrate (SPIRIVA  RESPIMAT) 2.5 MCG/ACT AERS, albuterol (VENTOLIN HFA) 108 (90 Base) MCG/ACT inhaler  Discussion: [name] Hall is a 78 year old male, former smoker with hypertension, DMII, DVT/PE, and Factor V Leiden deficiency who returns to pulmonary clinic for COPD follow up.   Chronic Obstructive Pulmonary Disease (COPD) Switch to Wixela and Spiriva due to cost and [facility] coverage. This combination matches Trelegy's therapeutic effect. - Prescribe Wixela inhaler: one puff morning and evening. - Prescribe Spiriva inhaler: two puffs in the morning. - Send prescriptions to [facility] pharmacy for coverage. - Add albuterol inhaler for as-needed use.  Schedule follow-up in six months.  Melody Comas, MD [facility] Office: [phone]    Current Outpatient Medications:    acetaminophen (TYLENOL) 650 MG CR tablet, Take 650 mg by mouth 2 (two) times daily., Disp: , Rfl:    albuterol (VENTOLIN HFA) 108 (90 Base) MCG/ACT inhaler, Inhale 2 puffs into the lungs every 6 (six) hours as needed for wheezing or shortness of breath., Disp: 8 g, Rfl: 6   apixaban (ELIQUIS) 5 MG TABS tablet, Take 5 mg by mouth 2 (two) times daily., Disp: , Rfl:    atorvastatin (LIPITOR) 80 MG tablet, Take 80 mg by mouth at bedtime., Disp: , Rfl:    fluticasone-salmeterol (WIXELA INHUB) 250-50 MCG/ACT AEPB, Inhale 1 puff into the lungs in the morning and at bedtime., Disp: 60 each, Rfl: 11   lithium carbonate 150 MG capsule, Take 450 mg by mouth 2 (two) times daily with a meal., Disp: , Rfl:    metFORMIN (GLUCOPHAGE) 500 MG tablet, Take 500 mg by mouth daily. , Disp: , Rfl:    ONE TOUCH ULTRA TEST test strip, , Disp: , Rfl:    Tiotropium Bromide Monohydrate (SPIRIVA RESPIMAT) 2.5 MCG/ACT AERS, Inhale 2 puffs into the lungs daily., Disp: 1 g, Rfl: 11

## 2024-02-05 ENCOUNTER — Encounter: Payer: Self-pay | Admitting: Pulmonary Disease

## 2024-02-09 ENCOUNTER — Encounter: Payer: Self-pay | Admitting: Pulmonary Disease

## 2024-03-01 ENCOUNTER — Telehealth: Payer: Self-pay

## 2024-03-01 ENCOUNTER — Encounter: Payer: Self-pay | Admitting: Pulmonary Disease

## 2024-03-01 NOTE — Telephone Encounter (Signed)
 ATC x1 LVM for patient to call our office back regarding medical release form .

## 2024-05-01 DIAGNOSIS — I10 Essential (primary) hypertension: Secondary | ICD-10-CM | POA: Diagnosis not present

## 2024-05-01 DIAGNOSIS — E1169 Type 2 diabetes mellitus with other specified complication: Secondary | ICD-10-CM | POA: Diagnosis not present

## 2024-05-01 DIAGNOSIS — D696 Thrombocytopenia, unspecified: Secondary | ICD-10-CM | POA: Diagnosis not present

## 2024-05-01 DIAGNOSIS — E785 Hyperlipidemia, unspecified: Secondary | ICD-10-CM | POA: Diagnosis not present

## 2024-05-02 DIAGNOSIS — E1169 Type 2 diabetes mellitus with other specified complication: Secondary | ICD-10-CM | POA: Diagnosis not present

## 2024-05-02 DIAGNOSIS — Z0000 Encounter for general adult medical examination without abnormal findings: Secondary | ICD-10-CM | POA: Diagnosis not present

## 2024-08-03 DIAGNOSIS — M545 Low back pain, unspecified: Secondary | ICD-10-CM | POA: Diagnosis not present

## 2024-09-05 ENCOUNTER — Encounter: Payer: Self-pay | Admitting: Gastroenterology

## 2024-10-23 ENCOUNTER — Ambulatory Visit: Payer: Self-pay | Admitting: Gastroenterology

## 2024-10-23 ENCOUNTER — Encounter: Payer: Self-pay | Admitting: Gastroenterology

## 2024-10-23 VITALS — BP 100/48 | HR 72 | Ht 71.0 in | Wt 214.1 lb

## 2024-10-23 DIAGNOSIS — R198 Other specified symptoms and signs involving the digestive system and abdomen: Secondary | ICD-10-CM

## 2024-10-23 DIAGNOSIS — Z7901 Long term (current) use of anticoagulants: Secondary | ICD-10-CM

## 2024-10-23 DIAGNOSIS — Z860101 Personal history of adenomatous and serrated colon polyps: Secondary | ICD-10-CM

## 2024-10-23 NOTE — Progress Notes (Signed)
 [name] Hall [id number] [birth year]   Chief Complaint: Alternating constipation and diarrhea, trouble swallowing, discuss colonoscopy  Referring Provider: Silva Bernardino LABOR, PA-C Primary GI MD: [place]  HPI: [name] Hall is a 78 y.o. male with past medical history of bipolar disorder, diabetes, DVT, factor V Leiden, hepatitis, HLD, orthostatic hypotension, PE 2018, prostate cancer 2012, OSA, prior stroke, abdominal aortic endovascular stent graft 2023, on chronic anticoagulation with Eliquis who presents today to discuss colonoscopy.    Patient referred by the [facility] for colonoscopy.  Per referral note patient has history of colon polyps.  Has reportedly had a colonoscopy every 5 years.  Had been contacted by his community PCP and advised he was due for colonoscopy.  Records unavailable.  No anemia on labs 08/2023.  Hematocrit normal on 03/03/2024.  Sees Dr. Okey with [facility], PCP.    Discussed the use of AI scribe software for clinical note transcription with the patient, who gave verbal consent to proceed.  History of Present Illness [name] Hall is a 78 year old male with a history of colon polyps who presents for a colonoscopy referral. He was referred by the [place].  Colorectal neoplasia surveillance - History of colon polyps with colonoscopies performed approximately every five years - Initial colonoscopy interval was every ten years, increased to every five years - Last colonoscopy performed five to six years ago by a civilian physician in [place], who has since retired - Referred by the [place] for colonoscopy  Altered bowel habits - Alternating constipation and diarrhea for the past two to three months, with diarrhea predominating - Diarrhea characterized by 'really loose' stools, not watery - During diarrhea episodes, up to five bowel movements per day; baseline is two to three per day - No blood in stool, abdominal pain, fever, chills, nausea, or vomiting - No recent  changes in diet or lifestyle aside from medication change (switched from Warfarin to Eliquis) - No prior stool testing or use of over-the-counter medications or fiber supplements for these symptoms  Anticoagulant medication change - Transitioned from warfarin to Eliquis (apixaban) as a blood thinner - Change initiated approximately two months prior to onset of bowel symptoms - Medication change made after consultation with civilian physician and [facility], following suggestion from nephew scientist, research (physical sciences))  Oropharyngeal dysphagia - Occasional difficulty swallowing saliva when lying down at night, resolves upon sitting up - No acid reflux, heartburn, or difficulty swallowing food  Cardiopulmonary symptoms and interventions - History of shortness of breath, managed with inhalers; symptom is chronic and not new or worsening - Underwent heart catheterization last week following abnormal stress test two years ago - Heart catheterization revealed minor plaque buildup; no stents placed   Previous GI Procedures/Imaging      Past Medical History:  Diagnosis Date   Alcoholism (HCC)    Anxiety    Bipolar disorder (HCC)    Diabetes mellitus    type 2   DVT (deep venous thrombosis) (HCC) 2006   leg after joint replacement    Dyspnea    with exertion occ   ED (erectile dysfunction)    Factor 5 Leiden mutation, heterozygous    Hepatitis    41 yrs ago hepatitis a   Hyperlipidemia    Orthostatic hypotension    Osteoarthritis    PE (pulmonary embolism) 01/27/2017   after right tha revision   PERSONAL HISTORY, VENOUS THROMBOSIS AND EMBOLISM    Prostate CA (HCC) 2012   treated with radiation   PROSTATE CANCER  Stroke (HCC) 15-20 yrs ago, saw on mri   TIA no residual from    Past Surgical History:  Procedure Laterality Date   ABDOMINAL AORTIC ENDOVASCULAR STENT GRAFT Bilateral 08/31/2022   Procedure: ABDOMINAL AORTIC ENDOVASCULAR STENT GRAFT;  Surgeon: Magda Debby SAILOR, MD;  Location: [facility] OR;   Service: Vascular;  Laterality: Bilateral;   CATARACT EXTRACTION Bilateral    colonoscopy     polyp located and tested negative   COLONOSCOPY WITH PROPOFOL   10/2003, 7985,7980   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   Procedure: PENILE PROTHESIS INFLATABLE;  Surgeon: Matilda Senior, MD;  Location: [facility];  Service: Urology;  Laterality: N/A;   TONSILLECTOMY AND ADENOIDECTOMY     AS A CHILD   TOTAL HIP ARTHROPLASTY     Left and right hip replacements   TOTAL HIP REVISION Right 01/27/2017   Procedure: RIGHT TOTAL HIP REVISION;  Surgeon: Dempsey Moan, MD;  Location: [facility] ORS;  Service: Orthopedics;  Laterality: Right;    Current Outpatient Medications  Medication Sig Dispense Refill   acetaminophen  (TYLENOL ) 650 MG CR tablet Take 650 mg by mouth 2 (two) times daily.     albuterol  (VENTOLIN  HFA) 108 (90 Base) MCG/ACT inhaler Inhale 2 puffs into the lungs every 6 (six) hours as needed for wheezing or shortness of breath. 8 g 6   apixaban (ELIQUIS) 5 MG TABS tablet Take 5 mg by mouth 2 (two) times daily.     atorvastatin  (LIPITOR ) 80 MG tablet Take 80 mg by mouth at bedtime.     fluticasone -salmeterol (WIXELA INHUB) 250-50 MCG/ACT AEPB Inhale 1 puff into the lungs in the morning and at bedtime. 60 each 11   lithium  carbonate 150 MG capsule Take 450 mg by mouth 2 (two) times daily with a meal.     metFORMIN  (GLUCOPHAGE ) 500 MG tablet Take 500 mg by mouth daily.      ONE TOUCH ULTRA TEST test strip      Tiotropium Bromide Monohydrate  (SPIRIVA  RESPIMAT) 2.5 MCG/ACT AERS Inhale 2 puffs into the lungs daily. 1 g 11   No current facility-administered medications for this visit.    Allergies as of 10/23/2024 - Review Complete 10/23/2024  Allergen Reaction Noted   Degarelix  05/04/2023   Duloxetine Swelling 06/27/2020   Pregabalin  06/27/2020   Ramipril Hives 07/27/2014   Sulfonamide derivatives  05/04/2023   Oxycodone  Itching and Rash 01/15/2017    Family History   Problem Relation Age of Onset   Hyperlipidemia Mother    Hypertension Mother    Heart attack Father    Hyperlipidemia Father    Hypertension Father    Sudden death Father    Depression Sister     Social History   Tobacco Use   Smoking status: Former    Current packs/day: 0.00    Average packs/day: 1 pack/day for 30.0 years (30.0 ttl pk-yrs)    Types: Cigarettes    Start date: 02/23/1984    Quit date: 02/22/2014    Years since quitting: 10.6   Smokeless tobacco: Never  Vaping Use   Vaping status: Never Used  Substance Use Topics   Alcohol  use: No   Drug use: No     Review of Systems:    Constitutional: No unintentional weight loss, fever, chills Cardiovascular: No chest pain Respiratory: SOB at baseline Gastrointestinal: See HPI and otherwise negative   Physical Exam:  Vital signs: BP (!) 100/48   Pulse 72   Ht 5' 11 (1.803 m)  Wt 214 lb 2 oz (97.1 kg)   BMI 29.86 kg/m   Constitutional: Pleasant, obese male in NAD, alert and cooperative Head:  Normocephalic and atraumatic.  Eyes: No scleral icterus.  Respiratory: Respirations even and unlabored. Lungs clear to auscultation bilaterally.  No wheezes, crackles, or rhonchi.  Cardiovascular:  Regular rate and rhythm. No murmurs. No peripheral edema. Gastrointestinal:  Soft, nondistended, nontender. No rebound or guarding. Normal bowel sounds. No appreciable masses or hepatomegaly. Rectal:  Not performed.  Neurologic:  Alert and oriented x4;  grossly normal neurologically.  Skin:   Dry and intact without significant lesions or rashes. Psychiatric: Oriented to person, place and time. Demonstrates good judgement and reason without abnormal affect or behaviors.   RELEVANT LABS AND IMAGING: CBC    Component Value Date/Time   WBC 8.1 10/21/2022 1252   RBC 4.13 (L) 10/21/2022 1252   HGB 12.4 (L) 10/21/2022 1252   HCT 40.4 10/21/2022 1252   PLT 172 10/21/2022 1252   MCV 97.8 10/21/2022 1252   MCH 30.0 10/21/2022  1252   MCHC 30.7 10/21/2022 1252   RDW 14.1 10/21/2022 1252   LYMPHSABS 1.4 10/21/2022 1252   MONOABS 0.8 10/21/2022 1252   EOSABS 0.3 10/21/2022 1252   BASOSABS 0.1 10/21/2022 1252    CMP     Component Value Date/Time   NA 137 10/21/2022 1252   K 4.2 10/21/2022 1252   CL 107 10/21/2022 1252   CO2 26 10/21/2022 1252   GLUCOSE 89 10/21/2022 1252   BUN 20 10/21/2022 1252   CREATININE 1.33 (H) 10/21/2022 1252   CALCIUM  9.7 10/21/2022 1252   PROT 7.5 10/21/2022 1252   ALBUMIN 3.8 10/21/2022 1252   AST 18 10/21/2022 1252   ALT 14 10/21/2022 1252   ALKPHOS 111 10/21/2022 1252   BILITOT 0.7 10/21/2022 1252   GFRNONAA 55 (L) 10/21/2022 1252   GFRAA >60 01/29/2017 0513   Echocardiogram 05/27/2023 1. Normal LV systolic function with visual EF 60-65%. Left ventricle cavity is normal in size. Normal left ventricular wall thickness. Normal global wall motion. Normal diastolic filling pattern, normal LAP. 2. Aortic valve sclerosis without stenosis. 3. No significant valvular heart disease. 4. Compared to 04/14/2012 no significant change.  Assessment/Plan:   Assessment & Plan Alternating diarrhea and constipation Alternating bowel habits for 2-3 months without alarming symptoms. No recent medication changes except apixaban initiation.  No prior evaluation for this and has not tried anything over-the-counter.  Denies any rectal bleeding or abdominal pain.  - Recommended fiber supplementation with Benefiber 1 tablespoon daily - Provided handout on high fiber diet. - Advised use of OTC Imodium  as needed for diarrhea, cautioning against overuse to avoid constipation.  History of colon polyps History of colon polyps with last colonoscopy 5-6 years ago.   - Plan for colonoscopy. I thoroughly discussed the procedure with the patient to include nature of the procedure, alternatives, benefits, and risks (including but not limited to bleeding, infection, perforation,  anesthesia/cardiac/pulmonary complications). Patient verbalized understanding and gave verbal consent to proceed with procedure.  - Will request records from previous provider at [facility].  Anticoagulation therapy (apixaban)  On apixaban.  Reports having had a stress test which was abnormal, followed by cardiac catheterization just last week which he states did not require any stenting and there were no concerning findings.  Based on this history will request cardiac clearance for procedure.  Hold off on scheduling colonoscopy until we get clearance and permission to hold Eliquis.  - Will request  clearance from cardiologist at [facility]. - Will request to hold apixaban prior to colonoscopy.   Camie Furbish, PA-C [facility] 10/23/2024, 2:09 PM  Patient Care Team: Okey Carlin Redbird, MD as PCP - General (Family Medicine) Michele Richardson, DO as PCP - Cardiology (Cardiology)

## 2024-10-23 NOTE — Patient Instructions (Signed)
 Start high fiber diet.   Start Benefiber 1 tablespoon in 8 ounces of liquid daily.  Will call back to schedule colonoscopy after received clearance from cardiology.

## 2024-10-24 ENCOUNTER — Telehealth: Payer: Self-pay | Admitting: *Deleted

## 2024-10-24 NOTE — Telephone Encounter (Signed)
 Faxed clearance request to [facility].

## 2024-11-16 MED ORDER — PEG-KCL-NACL-NASULF-NA ASC-C 100 G PO SOLR: 1.0000 | Freq: Once | ORAL | 0 refills | Status: AC

## 2024-11-16 NOTE — Telephone Encounter (Signed)
Cardiology has cleared patient for procedure.

## 2024-11-16 NOTE — Telephone Encounter (Signed)
 Procedure scheduled for 12/21/24. Patient states Eliquis is prescribed by his PCP at [place]. Will fax Eliquis clearance.

## 2024-11-16 NOTE — Telephone Encounter (Signed)
 Clearance request has been sent and placed in the media tab.

## 2024-12-14 ENCOUNTER — Encounter: Payer: Self-pay | Admitting: Gastroenterology

## 2024-12-21 ENCOUNTER — Ambulatory Visit: Payer: Self-pay | Admitting: Gastroenterology

## 2024-12-21 ENCOUNTER — Encounter: Payer: Self-pay | Admitting: Gastroenterology

## 2024-12-21 VITALS — BP 104/54 | HR 65 | Temp 97.7°F | Resp 15 | Ht 71.0 in | Wt 214.0 lb

## 2024-12-21 DIAGNOSIS — Z860101 Personal history of adenomatous and serrated colon polyps: Secondary | ICD-10-CM | POA: Diagnosis not present

## 2024-12-21 DIAGNOSIS — Z1211 Encounter for screening for malignant neoplasm of colon: Secondary | ICD-10-CM

## 2024-12-21 DIAGNOSIS — D122 Benign neoplasm of ascending colon: Secondary | ICD-10-CM

## 2024-12-21 MED ORDER — SODIUM CHLORIDE 0.9 % IV SOLN: 500.0000 mL | INTRAVENOUS | Status: DC

## 2024-12-21 NOTE — Progress Notes (Signed)
 Called to room to assist during endoscopic procedure.  Patient ID and intended procedure confirmed with present staff. Received instructions for my participation in the procedure from the performing physician.

## 2024-12-21 NOTE — Progress Notes (Unsigned)
 Pt's states no medical or surgical changes since previsit or office visit.

## 2024-12-21 NOTE — Progress Notes (Unsigned)
 [facility] History and Physical   Primary Care Physician:  Okey Carlin Redbird, MD   Reason for Procedure:  History of adenomatous colon polyps  Plan:    Surveillance colonoscopy with possible interventions as needed     HPI: [name] Hall is a very pleasant 79 y.o. male here for surveillance colonoscopy. Denies any nausea, vomiting, abdominal pain, melena or bright red blood per rectum  The risks and benefits as well as alternatives of endoscopic procedure(s) have been discussed and reviewed.  The patient was provided an opportunity to ask questions and all were answered. The patient agreed with the plan and demonstrated an understanding of the instructions.   Past Medical History:  Diagnosis Date   Alcoholism (HCC)    Anxiety    Bipolar disorder (HCC)    Diabetes mellitus    type 2   DVT (deep venous thrombosis) (HCC) 2006   leg after joint replacement    Dyspnea    with exertion occ   ED (erectile dysfunction)    Factor 5 Leiden mutation, heterozygous    Hepatitis    41 yrs ago hepatitis a   Hyperlipidemia    Orthostatic hypotension    Osteoarthritis    PE (pulmonary embolism) 01/27/2017   after right tha revision   PERSONAL HISTORY, VENOUS THROMBOSIS AND EMBOLISM    Prostate CA (HCC) 2012   treated with radiation   PROSTATE CANCER    Stroke (HCC) 15-20 yrs ago, saw on mri   TIA no residual from    Past Surgical History:  Procedure Laterality Date   ABDOMINAL AORTIC ENDOVASCULAR STENT GRAFT Bilateral 08/31/2022   Procedure: ABDOMINAL AORTIC ENDOVASCULAR STENT GRAFT;  Surgeon: Magda Debby SAILOR, MD;  Location: [facility] OR;  Service: Vascular;  Laterality: Bilateral;   CATARACT EXTRACTION Bilateral    colonoscopy     polyp located and tested negative   COLONOSCOPY WITH PROPOFOL   10/2003, 7985,7980   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   Procedure: PENILE PROTHESIS INFLATABLE;  Surgeon: Matilda Senior, MD;  Location: [facility];   Service: Urology;  Laterality: N/A;   TONSILLECTOMY AND ADENOIDECTOMY     AS A CHILD   TOTAL HIP ARTHROPLASTY     Left and right hip replacements   TOTAL HIP REVISION Right 01/27/2017   Procedure: RIGHT TOTAL HIP REVISION;  Surgeon: Dempsey Moan, MD;  Location: [facility] ORS;  Service: Orthopedics;  Laterality: Right;    Prior to Admission medications  Medication Sig Start Date End Date Taking? Authorizing Provider  acetaminophen  (TYLENOL ) 650 MG CR tablet Take 650 mg by mouth 2 (two) times daily.   Yes [provider]  albuterol  (VENTOLIN  HFA) 108 (90 Base) MCG/ACT inhaler Inhale 2 puffs into the lungs every 6 (six) hours as needed for wheezing or shortness of breath. 02/04/24  Yes Kara Dorn NOVAK, MD  atorvastatin  (LIPITOR ) 80 MG tablet Take 80 mg by mouth at bedtime.   Yes [provider]  fluticasone -salmeterol (WIXELA INHUB) 250-50 MCG/ACT AEPB Inhale 1 puff into the lungs in the morning and at bedtime. 02/04/24  Yes Kara Dorn NOVAK, MD  lithium  carbonate 150 MG capsule Take 450 mg by mouth 2 (two) times daily with a meal.   Yes [provider]  metFORMIN  (GLUCOPHAGE ) 500 MG tablet Take 500 mg by mouth daily.    Yes [provider]  ONE TOUCH ULTRA TEST test strip  04/29/17  Yes [provider]  Tiotropium Bromide 2.5 MCG/ACT AERS Take by mouth.  03/03/24  Yes [provider]  Tiotropium Bromide Monohydrate  (SPIRIVA  RESPIMAT) 2.5 MCG/ACT AERS Inhale 2 puffs into the lungs daily. 02/04/24  Yes Kara Dorn NOVAK, MD  triamcinolone  cream (KENALOG ) 0.1 % Apply topically. 12/12/24  Yes [provider]  apixaban (ELIQUIS) 5 MG TABS tablet Take 5 mg by mouth 2 (two) times daily.    [provider]  lithium  300 MG tablet Take by mouth.    [provider]    Current Outpatient Medications  Medication Sig Dispense Refill   acetaminophen  (TYLENOL ) 650 MG CR tablet Take 650 mg by mouth 2 (two) times daily.     albuterol   (VENTOLIN  HFA) 108 (90 Base) MCG/ACT inhaler Inhale 2 puffs into the lungs every 6 (six) hours as needed for wheezing or shortness of breath. 8 g 6   atorvastatin  (LIPITOR ) 80 MG tablet Take 80 mg by mouth at bedtime.     fluticasone -salmeterol (WIXELA INHUB) 250-50 MCG/ACT AEPB Inhale 1 puff into the lungs in the morning and at bedtime. 60 each 11   lithium  carbonate 150 MG capsule Take 450 mg by mouth 2 (two) times daily with a meal.     metFORMIN  (GLUCOPHAGE ) 500 MG tablet Take 500 mg by mouth daily.      ONE TOUCH ULTRA TEST test strip      Tiotropium Bromide 2.5 MCG/ACT AERS Take by mouth.     Tiotropium Bromide Monohydrate  (SPIRIVA  RESPIMAT) 2.5 MCG/ACT AERS Inhale 2 puffs into the lungs daily. 1 g 11   triamcinolone  cream (KENALOG ) 0.1 % Apply topically.     apixaban (ELIQUIS) 5 MG TABS tablet Take 5 mg by mouth 2 (two) times daily.     lithium  300 MG tablet Take by mouth.     Current Facility-Administered Medications  Medication Dose Route Frequency Provider Last Rate Last Admin   0.9 %  sodium chloride  infusion  500 mL Intravenous Continuous Bulmaro Feagans V, MD        Allergies as of 12/21/2024 - Review Complete 12/21/2024  Allergen Reaction Noted   Pregabalin Other (See Comments) 06/27/2020   Degarelix Other (See Comments) 05/04/2023   Duloxetine Swelling 06/27/2020   Oxycodone  Itching and Rash 01/15/2017   Ramipril Hives 07/27/2014   Sulfonamide derivatives Other (See Comments) 05/04/2023    Family History  Problem Relation Age of Onset   Hyperlipidemia Mother    Hypertension Mother    Heart attack Father    Hyperlipidemia Father    Hypertension Father    Sudden death Father    Depression Sister     Social History   Socioeconomic History   Marital status: Married    Spouse name: Not on file   Number of children: 3   Years of education: Not on file   Highest education level: Not on file  Occupational History   Occupation: retired surveyor, minerals  Tobacco Use    Smoking status: Former    Current packs/day: 0.00    Average packs/day: 1 pack/day for 30.0 years (30.0 ttl pk-yrs)    Types: Cigarettes    Start date: 02/23/1984    Quit date: 02/22/2014    Years since quitting: 10.8   Smokeless tobacco: Never  Vaping Use   Vaping status: Never Used  Substance and Sexual Activity   Alcohol  use: No   Drug use: No   Sexual activity: Never  Other Topics Concern   Not on file  Social History Narrative   Not on file   Social Drivers of  Health   Tobacco Use: Medium Risk (12/21/2024)   Patient History    Smoking Tobacco Use: Former    Smokeless Tobacco Use: Never    Passive Exposure: Not on Actuary Strain: Not on file  Food Insecurity: Not on file  Transportation Needs: Not on file  Physical Activity: Not on file  Stress: Not on file  Social Connections: Not on file  Intimate Partner Violence: Not on file  Depression (EYV7-0): Not on file  Alcohol  Screen: Not on file  Housing: Not on file  Utilities: Not on file  Health Literacy: Not on file    Review of Systems:  All other review of systems negative except as mentioned in the HPI.  Physical Exam: Vital signs in last 24 hours: BP 135/75   Pulse 64   Temp 97.7 F (36.5 C) (Temporal)   Resp 10   Ht 5' 11 (1.803 m)   Wt 214 lb (97.1 kg)   SpO2 99%   BMI 29.85 kg/m  General:   Alert, NAD Lungs:  Clear .   Heart:  Regular rate and rhythm Abdomen:  Soft, nontender and nondistended. Neuro/Psych:  Alert and cooperative. Normal mood and affect. A and O x 3  Reviewed labs, radiology imaging, old records and pertinent past GI work up  Patient is appropriate for planned procedure(s) and anesthesia in an ambulatory setting   K. Veena Serenah Mill , MD [phone]

## 2024-12-21 NOTE — Progress Notes (Signed)
 Transferred to PACU via stretcher. Patient arousing to stimulation.  VSS upon leaving procedure room.

## 2024-12-21 NOTE — Patient Instructions (Addendum)
 Resume previous diet Continue present medications RESUME ELIQUIS AT PREVIOUS DOSE ON Friday  12/23/23 Repeat colonoscopy 01/19/25 arrive at 1230 pm Previsit on the 2nd floor on 12/27/24 at 10 AM Await pathology results  Handouts/information given for polyps  YOU HAD AN ENDOSCOPIC PROCEDURE TODAY AT THE [facility]:   Refer to the procedure report that was given to you for any specific questions about what was found during the examination.  If the procedure report does not answer your questions, please call your gastroenterologist to clarify.  If you requested that your care partner not be given the details of your procedure findings, then the procedure report has been included in a sealed envelope for you to review at your convenience later.  YOU SHOULD EXPECT: Some feelings of bloating in the abdomen. Passage of more gas than usual.  Walking can help get rid of the air that was put into your GI tract during the procedure and reduce the bloating. If you had a lower endoscopy (such as a colonoscopy or flexible sigmoidoscopy) you may notice spotting of blood in your stool or on the toilet paper. If you underwent a bowel prep for your procedure, you may not have a normal bowel movement for a few days.  Please Note:  You might notice some irritation and congestion in your nose or some drainage.  This is from the oxygen used during your procedure.  There is no need for concern and it should clear up in a day or so.  SYMPTOMS TO REPORT IMMEDIATELY:  Following lower endoscopy (colonoscopy):  Excessive amounts of blood in the stool  Significant tenderness or worsening of abdominal pains  Swelling of the abdomen that is new, acute  Fever of 100F or higher For urgent or emergent issues, a gastroenterologist can be reached at any hour by calling [phone]. Do not use MyChart messaging for urgent concerns.   DIET:  We do recommend a small meal at first, but then you may proceed to your  regular diet.  Drink plenty of fluids but you should avoid alcoholic beverages for 24 hours.  ACTIVITY:  You should plan to take it easy for the rest of today and you should NOT DRIVE or use heavy machinery until tomorrow (because of the sedation medicines used during the test).    FOLLOW UP: Our staff will call the number listed on your records the next business day following your procedure.  We will call around 7:15- 8:00 am to check on you and address any questions or concerns that you may have regarding the information given to you following your procedure. If we do not reach you, we will leave a message.     If any biopsies were taken you will be contacted by phone or by letter within the next 1-3 weeks.  Please call us  at [phone] if you have not heard about the biopsies in 3 weeks.   SIGNATURES/CONFIDENTIALITY: You and/or your care partner have signed paperwork which will be entered into your electronic medical record.  These signatures attest to the fact that that the information above on your After Visit Summary has been reviewed and is understood.  Full responsibility of the confidentiality of this discharge information lies with you and/or your care-partner.

## 2024-12-21 NOTE — Op Note (Signed)
 [facility] Patient Name: [name] Hall Procedure Date: 12/21/2024 7:45 AM MRN: [id number] Endoscopist: Gustav ALONSO Mcgee , MD, [id number] Age: 79 Referring MD:  Date of Birth: 06-03-1946 Gender: Male Account #: [id number] Procedure:                Colonoscopy Indications:              High risk colon cancer surveillance: Personal                            history of colonic polyps, High risk colon cancer                            surveillance: Personal history of adenoma (10 mm or                            greater in size), High risk colon cancer                            surveillance: Personal history of multiple (3 or                            more) adenomas Medicines:                Monitored Anesthesia Care Procedure:                Pre-Anesthesia Assessment:                           - Prior to the procedure, a History and Physical                            was performed, and patient medications and                            allergies were reviewed. The patient's tolerance of                            previous anesthesia was also reviewed. The risks                            and benefits of the procedure and the sedation                            options and risks were discussed with the patient.                            All questions were answered, and informed consent                            was obtained. Prior Anticoagulants: The patient                            last took Eliquis (apixaban) 2 days prior to the  procedure. ASA Grade Assessment: III - A patient                            with severe systemic disease. After reviewing the                            risks and benefits, the patient was deemed in                            satisfactory condition to undergo the procedure.                           After obtaining informed consent, the colonoscope                            was passed under direct vision.  Throughout the                            procedure, the patient's blood pressure, pulse, and                            oxygen saturations were monitored continuously. The                            Olympus Scope SN: [id number] was introduced through                            the anus and advanced to the the cecum, identified                            by appendiceal orifice and ileocecal valve. The                            colonoscopy was performed without difficulty. The                            patient tolerated the procedure well. The quality                            of the bowel preparation was poor. The ileocecal                            valve, appendiceal orifice, and rectum were                            photographed. Scope In: 8:22:51 AM Scope Out: 8:38:02 AM Scope Withdrawal Time: 0 hours 11 minutes 27 seconds  Total Procedure Duration: 0 hours 15 minutes 11 seconds  Findings:                 The perianal and digital rectal examinations were                            normal.  An 18 mm polyp was found in the ascending colon.                            The polyp was sessile. The polyp was removed with a                            cold snare. The polyp was removed with a piecemeal                            technique using a cold snare. Resection and                            retrieval were complete.                           A moderate amount of stool was found in the entire                            colon, making visualization difficult. Complications:            No immediate complications. Estimated Blood Loss:     Estimated blood loss was minimal. Impression:               - Preparation of the colon was poor.                           - One 18 mm polyp in the ascending colon, removed                            with a cold snare and removed piecemeal using a                            cold snare. Resected and retrieved.                            - Stool in the entire examined colon. Recommendation:           - Resume previous diet.                           - Continue present medications.                           - Await pathology results.                           - Repeat colonoscopy at the next available                            appointment because the bowel preparation was                            suboptimal.                           - Resume Eliquis (apixaban)  at prior dose tomorrow.                            Refer to managing physician for further adjustment                            of therapy. Sharief Wainwright V. Loise Esguerra, MD 12/21/2024 8:50:40 AM This report has been signed electronically.

## 2024-12-22 ENCOUNTER — Telehealth: Payer: Self-pay

## 2024-12-22 NOTE — Telephone Encounter (Signed)
" °  Follow up Call-     12/21/2024    7:32 AM  Call back number  Post procedure Call Back phone  # [phone]  Permission to leave phone message Yes     Patient questions:  Do you have a fever, pain , or abdominal swelling? No. Pain Score  0 *  Have you tolerated food without any problems? Yes.    Have you been able to return to your normal activities? Yes.    Do you have any questions about your discharge instructions: Diet   No. Medications  No. Follow up visit  No.  Do you have questions or concerns about your Care? No.  Actions: * If pain score is 4 or above: No action needed, pain <4.   "

## 2024-12-26 LAB — SURGICAL PATHOLOGY

## 2024-12-27 ENCOUNTER — Ambulatory Visit

## 2024-12-27 VITALS — Ht 71.0 in | Wt 219.0 lb

## 2024-12-27 DIAGNOSIS — Z860100 Personal history of colon polyps, unspecified: Secondary | ICD-10-CM

## 2024-12-27 MED ORDER — PEG 3350-KCL-NA BICARB-NACL 420 G PO SOLR: 4000.0000 mL | Freq: Once | ORAL | 0 refills | Status: AC

## 2024-12-27 NOTE — Progress Notes (Signed)
 No egg or soy allergy known to patient  No issues known to pt with past sedation with any surgeries or procedures Patient denies ever being told they had issues or difficulty with intubation  No FH of Malignant Hyperthermia Pt is not on diet pills Pt is not on  home 02  Pt is on blood thinners - Eliquis, 2 day hold instructions provided  Pt denies issues with constipation  No A fib or A flutter Have any cardiac testing pending--No Pt can ambulate  Pt denies use of chewing tobacco Discussed diabetic I weight loss medication holds Discussed NSAID holds Checked BMI Pt instructed to use Singlecare.com or GoodRx for a price reduction on prep  Patient's chart reviewed by Norleen Schillings CNRA prior to previsit and patient appropriate for the [facility].  Pre visit completed and red dot placed by patient's name on their procedure day (on provider's schedule).     Golytely  prep sent to [facility] per pt request

## 2024-12-28 ENCOUNTER — Ambulatory Visit: Payer: Self-pay | Admitting: Gastroenterology

## 2025-01-05 ENCOUNTER — Encounter: Payer: Self-pay | Admitting: Gastroenterology

## 2025-01-17 ENCOUNTER — Encounter: Admitting: Gastroenterology

## 2025-01-19 ENCOUNTER — Encounter: Admitting: Gastroenterology
